# Patient Record
Sex: Female | Born: 1970 | Race: Asian | Hispanic: No | Marital: Married | Smoking: Never smoker
Health system: Western US, Academic
[De-identification: ages and names within clinical notes are randomized; demographics above are authoritative.]

## PROBLEM LIST (undated history)

## (undated) DIAGNOSIS — N200 Calculus of kidney: Secondary | ICD-10-CM

## (undated) DIAGNOSIS — Z87442 Personal history of urinary calculi: Secondary | ICD-10-CM

## (undated) DIAGNOSIS — N8500 Endometrial hyperplasia, unspecified: Secondary | ICD-10-CM

## (undated) HISTORY — PX: APPENDECTOMY: SHX54

## (undated) HISTORY — DX: Personal history of urinary calculi: Z87.442

## (undated) HISTORY — DX: Endometrial hyperplasia, unspecified: N85.00

## (undated) HISTORY — DX: Calculus of kidney: N20.0

## (undated) MED ORDER — SODIUM CHLORIDE 0.9 % IV SOLN
Freq: Once | INTRAVENOUS | Status: AC
Start: 2019-04-06 — End: 2019-04-06

## (undated) MED ORDER — DIPHENHYDRAMINE HCL 50 MG/ML IJ SOLN
25.00 mg | Freq: Once | INTRAMUSCULAR | Status: AC | PRN
Start: 2019-04-06 — End: ?

## (undated) MED ORDER — METHYLPREDNISOLONE SODIUM SUCC 125 MG IJ SOLR
125.00 mg | Freq: Once | INTRAMUSCULAR | Status: AC
Start: 2019-03-30 — End: 2019-03-28

## (undated) MED ORDER — ACETAMINOPHEN 325 MG PO TABS
650.00 mg | ORAL_TABLET | Freq: Once | ORAL | Status: AC
Start: 2019-04-06 — End: 2019-04-06

## (undated) MED ORDER — RITUXIMAB 500 MG/50ML IV SOLN
375.00 mg/m2 | Freq: Once | INTRAVENOUS | Status: AC
Start: 2019-03-30 — End: 2019-03-30

## (undated) MED ORDER — NALTREXONE HCL (PAIN) 4.5 MG PO CAPS
2.00 | ORAL_CAPSULE | Freq: Every day | ORAL | 0 refills | Status: AC
Start: 2022-12-30 — End: ?

## (undated) MED ORDER — WEGOVY 1.7 MG/0.75ML SC SOAJ
SUBCUTANEOUS | 0 refills | Status: AC
Start: 2020-05-21 — End: ?

## (undated) MED ORDER — BUPROPION XL (DAILY) 150 MG OR TB24
150.0000 mg | ORAL_TABLET | Freq: Every morning | ORAL | 3 refills | Status: AC
Start: 2022-05-20 — End: ?

## (undated) MED ORDER — METHYLPREDNISOLONE SODIUM SUCC 125 MG IJ SOLR
125.00 mg | Freq: Once | INTRAMUSCULAR | Status: AC
Start: 2019-04-06 — End: 2019-04-06

## (undated) MED ORDER — DIPHENHYDRAMINE HCL 50 MG/ML IJ SOLN
50.00 mg | Freq: Once | INTRAMUSCULAR | Status: AC
Start: 2019-03-30 — End: 2019-03-30

## (undated) MED ORDER — OXYCODONE HCL 5 MG OR TABS
10.0000 mg | ORAL_TABLET | ORAL | Status: AC | PRN
Start: 2022-01-08 — End: ?

## (undated) MED ORDER — WEGOVY 2.4 MG/0.75ML SC SOAJ
SUBCUTANEOUS | 2 refills | Status: AC
Start: 2021-05-02 — End: ?

## (undated) MED ORDER — TRAZODONE HCL 50 MG OR TABS
50.0000 mg | ORAL_TABLET | Freq: Every evening | ORAL | 1 refills | Status: AC
Start: 2022-10-03 — End: ?

## (undated) MED ORDER — HYDROCORTISONE SOD SUCCINATE 100 MG IJ SOLR
100.00 mg | Freq: Once | INTRAMUSCULAR | Status: AC | PRN
Start: 2019-03-30 — End: ?

## (undated) MED ORDER — ACETAMINOPHEN 325 MG OR TABS
650.00 mg | ORAL_TABLET | Freq: Once | ORAL | Status: AC
Start: 2019-03-30 — End: 2019-03-28

## (undated) MED ORDER — FENTANYL CITRATE (PF) 100 MCG/2ML IJ SOLN
50.0000 ug | INTRAMUSCULAR | Status: AC | PRN
Start: 2022-01-08 — End: 2022-01-09

## (undated) MED ORDER — DIPHENHYDRAMINE HCL 25 MG OR TABS OR CAPS CUSTOM
25.00 mg | ORAL_CAPSULE | Freq: Once | ORAL | Status: AC
Start: 2019-04-06 — End: 2019-04-06

## (undated) MED ORDER — ACETAMINOPHEN 325 MG PO TABS
650.00 mg | ORAL_TABLET | Freq: Once | ORAL | Status: AC
Start: 2019-03-30 — End: 2019-03-28

## (undated) MED ORDER — BUPROPION XL (DAILY) 150 MG OR TB24
150.0000 mg | ORAL_TABLET | Freq: Every morning | ORAL | 3 refills | Status: AC
Start: 2023-07-23 — End: ?

## (undated) MED ORDER — DIPHENHYDRAMINE HCL 25 MG OR TABS OR CAPS CUSTOM
25.00 mg | ORAL_CAPSULE | Freq: Once | ORAL | Status: AC
Start: 2019-03-30 — End: 2019-03-28

## (undated) MED ORDER — WEGOVY 0.5 MG/0.5ML SC SOAJ
SUBCUTANEOUS | 0 refills | Status: AC
Start: 2021-05-02 — End: ?

## (undated) MED ORDER — ACETAMINOPHEN 325 MG OR TABS
650.00 mg | ORAL_TABLET | Freq: Once | ORAL | Status: AC
Start: 2019-04-06 — End: 2019-04-06

## (undated) MED ORDER — RITUXIMAB 500 MG/50ML IV SOLN
375.00 mg/m2 | Freq: Once | INTRAVENOUS | Status: AC
Start: 2019-04-06 — End: 2019-04-06

## (undated) MED ORDER — GABAPENTIN 100 MG OR CAPS
ORAL_CAPSULE | ORAL | Status: AC
Start: 2023-01-13 — End: ?

## (undated) MED ORDER — WEGOVY 1 MG/0.5ML SC SOAJ
SUBCUTANEOUS | 0 refills | Status: AC
Start: 2021-05-02 — End: ?

## (undated) MED ORDER — ALPRAZOLAM 0.5 MG OR TABS
0.50 mg | ORAL_TABLET | Freq: Every evening | ORAL | 2 refills | Status: AC | PRN
Start: 2023-03-13 — End: ?

## (undated) MED ORDER — PLASMA-LYTE A IV SOLN
INTRAVENOUS | Status: AC
Start: 2022-01-08 — End: ?

## (undated) MED ORDER — DIPHENHYDRAMINE HCL 50 MG/ML IJ SOLN
50.00 mg | Freq: Once | INTRAMUSCULAR | Status: AC
Start: 2019-03-29 — End: 2019-03-29

## (undated) MED ORDER — FAMOTIDINE 20 MG/2ML IV SOLN
20.00 mg | Freq: Once | INTRAVENOUS | Status: AC | PRN
Start: 2019-03-30 — End: ?

## (undated) MED ORDER — RITUXIMAB 500 MG/50ML IV SOLN
375.00 mg/m2 | Freq: Once | INTRAVENOUS | Status: AC
Start: 2019-04-06 — End: 2019-04-05

## (undated) MED ORDER — SODIUM CHLORIDE 0.9 % IV SOLN
Freq: Once | INTRAVENOUS | Status: AC
Start: 2019-03-30 — End: 2019-03-28

## (undated) MED ORDER — VALACYCLOVIR HCL 500 MG OR TABS
500.00 mg | ORAL_TABLET | Freq: Every day | ORAL | 3 refills | Status: AC
Start: 2022-12-31 — End: ?

## (undated) MED ORDER — DIPHENHYDRAMINE HCL 50 MG/ML IJ SOLN
25.00 mg | Freq: Once | INTRAMUSCULAR | Status: AC | PRN
Start: 2019-03-30 — End: ?

## (undated) MED ORDER — OXYCODONE HCL 5 MG OR TABS
5.0000 mg | ORAL_TABLET | ORAL | Status: AC | PRN
Start: 2022-01-08 — End: ?

## (undated) MED ORDER — HYDROCODONE-ACETAMINOPHEN 5-325 MG OR TABS
1.0000 | ORAL_TABLET | ORAL | Status: AC | PRN
Start: 2022-01-08 — End: ?

## (undated) MED ORDER — GABAPENTIN 100 MG OR CAPS
ORAL_CAPSULE | ORAL | 1 refills | Status: AC
Start: 2023-02-06 — End: ?

## (undated) MED ORDER — MEPERIDINE HCL 25 MG/ML IJ SOLN
12.5000 mg | INTRAMUSCULAR | Status: AC | PRN
Start: 2022-01-08 — End: ?

## (undated) MED ORDER — SODIUM CHLORIDE 0.9 % IV SOLN
375.00 mg/m2 | Freq: Once | INTRAVENOUS | Status: AC
Start: 2019-03-30 — End: 2019-03-28

## (undated) MED ORDER — SODIUM CHLORIDE 0.9 % IV SOLN
12.5000 mg | Freq: Once | INTRAVENOUS | Status: AC | PRN
Start: 2022-01-08 — End: ?

## (undated) MED ORDER — HYDROCORTISONE SOD SUCCINATE 100 MG IJ SOLR
100.00 mg | Freq: Once | INTRAMUSCULAR | Status: AC | PRN
Start: 2019-04-06 — End: ?

## (undated) MED ORDER — FAMOTIDINE 20 MG/2ML IV SOLN
20.00 mg | Freq: Once | INTRAVENOUS | Status: AC | PRN
Start: 2019-04-06 — End: ?

## (undated) MED ORDER — WEGOVY 0.25 MG/0.5ML SC SOAJ
SUBCUTANEOUS | 0 refills | Status: AC
Start: 2021-05-02 — End: ?

## (undated) MED ORDER — NALOXONE HCL 0.4 MG/ML IJ SOLN
0.2000 mg | Freq: Once | INTRAMUSCULAR | Status: AC | PRN
Start: 2022-01-08 — End: ?

---

## 2000-08-04 HISTORY — PX: APPENDECTOMY: SHX54

## 2003-02-24 ENCOUNTER — Emergency Department (HOSPITAL_COMMUNITY): Admission: EM | Admit: 2003-02-24 | Discharge: 2003-02-24 | Payer: Self-pay | Admitting: *Deleted

## 2003-02-24 ENCOUNTER — Encounter: Payer: Self-pay | Admitting: *Deleted

## 2003-04-26 ENCOUNTER — Other Ambulatory Visit: Admission: RE | Admit: 2003-04-26 | Discharge: 2003-04-26 | Payer: Self-pay | Admitting: Gynecology

## 2003-09-23 ENCOUNTER — Emergency Department (HOSPITAL_COMMUNITY): Admission: EM | Admit: 2003-09-23 | Discharge: 2003-09-23 | Payer: Self-pay | Admitting: Emergency Medicine

## 2004-04-12 ENCOUNTER — Other Ambulatory Visit: Admission: RE | Admit: 2004-04-12 | Discharge: 2004-04-12 | Payer: Self-pay | Admitting: Gynecology

## 2004-11-03 ENCOUNTER — Inpatient Hospital Stay (HOSPITAL_COMMUNITY): Admission: AD | Admit: 2004-11-03 | Discharge: 2004-11-05 | Payer: Self-pay | Admitting: Gynecology

## 2004-11-06 ENCOUNTER — Encounter: Admission: RE | Admit: 2004-11-06 | Discharge: 2004-12-06 | Payer: Self-pay | Admitting: Gynecology

## 2004-12-19 ENCOUNTER — Other Ambulatory Visit: Admission: RE | Admit: 2004-12-19 | Discharge: 2004-12-19 | Payer: Self-pay | Admitting: Gynecology

## 2005-06-13 ENCOUNTER — Ambulatory Visit: Payer: Self-pay | Admitting: Pulmonary Disease

## 2005-07-14 ENCOUNTER — Ambulatory Visit: Payer: Self-pay | Admitting: Pulmonary Disease

## 2006-01-06 ENCOUNTER — Other Ambulatory Visit: Admission: RE | Admit: 2006-01-06 | Discharge: 2006-01-06 | Payer: Self-pay | Admitting: Gynecology

## 2007-03-26 ENCOUNTER — Other Ambulatory Visit: Admission: RE | Admit: 2007-03-26 | Discharge: 2007-03-26 | Payer: Self-pay | Admitting: Gynecology

## 2008-06-21 ENCOUNTER — Ambulatory Visit: Payer: Self-pay | Admitting: Gynecology

## 2008-08-22 ENCOUNTER — Ambulatory Visit: Payer: Self-pay | Admitting: Gynecology

## 2008-08-23 ENCOUNTER — Ambulatory Visit: Payer: Self-pay | Admitting: Gynecology

## 2009-02-16 ENCOUNTER — Ambulatory Visit: Payer: Self-pay | Admitting: Gynecology

## 2009-03-06 ENCOUNTER — Ambulatory Visit: Payer: Self-pay | Admitting: Gynecology

## 2009-03-14 ENCOUNTER — Other Ambulatory Visit: Admission: RE | Admit: 2009-03-14 | Discharge: 2009-03-14 | Payer: Self-pay | Admitting: Gynecology

## 2009-03-14 ENCOUNTER — Encounter: Payer: Self-pay | Admitting: Gynecology

## 2009-03-14 ENCOUNTER — Ambulatory Visit: Payer: Self-pay | Admitting: Gynecology

## 2009-09-19 ENCOUNTER — Ambulatory Visit: Payer: Self-pay | Admitting: Internal Medicine

## 2009-09-19 DIAGNOSIS — Z87442 Personal history of urinary calculi: Secondary | ICD-10-CM | POA: Insufficient documentation

## 2009-10-30 ENCOUNTER — Ambulatory Visit: Payer: Self-pay | Admitting: Gynecology

## 2009-11-02 ENCOUNTER — Ambulatory Visit: Payer: Self-pay | Admitting: Gynecology

## 2009-11-30 ENCOUNTER — Ambulatory Visit: Payer: Self-pay | Admitting: Gynecology

## 2010-02-08 ENCOUNTER — Ambulatory Visit: Payer: Self-pay | Admitting: Gynecology

## 2010-03-18 ENCOUNTER — Other Ambulatory Visit: Admission: RE | Admit: 2010-03-18 | Discharge: 2010-03-18 | Payer: Self-pay | Admitting: Gynecology

## 2010-03-18 ENCOUNTER — Ambulatory Visit: Payer: Self-pay | Admitting: Gynecology

## 2010-03-27 ENCOUNTER — Ambulatory Visit: Payer: Self-pay | Admitting: Gynecology

## 2010-03-28 ENCOUNTER — Ambulatory Visit: Payer: Self-pay | Admitting: Gynecology

## 2010-05-15 ENCOUNTER — Ambulatory Visit: Payer: Self-pay | Admitting: Gynecology

## 2010-09-01 LAB — CONVERTED CEMR LAB
LDL Cholesterol: 78 mg/dL (ref 0–99)
Total CHOL/HDL Ratio: 4
VLDL: 27.2 mg/dL (ref 0.0–40.0)

## 2010-09-03 NOTE — Letter (Signed)
Summary: Lipid Letter  [facility]  [address] [place], [place] [address]   Phone: [phone]  Fax: [phone]    09/19/2009  Pamela Crosby [address] [place], [place]  [address]  Dear Pamela Crosby:  We have carefully reviewed your last lipid profile from 09/19/2009 and the results are noted below with a summary of recommendations for lipid management.    Cholesterol:       137     Goal: <200   HDL "good" Cholesterol:   57.84     Goal: >40   LDL "bad" Cholesterol:   78     Goal: <130   Triglycerides:       136.0     Goal: <150    so-so results    TLC Diet (Therapeutic Lifestyle Change): Saturated Fats & Transfatty acids should be kept < 7% of total calories ***Reduce Saturated Fats Polyunstaurated Fat can be up to 10% of total calories Monounsaturated Fat Fat can be up to 20% of total calories Total Fat should be no greater than 25-35% of total calories Carbohydrates should be 50-60% of total calories Protein should be approximately 15% of total calories Fiber should be at least 20-30 grams a day ***Increased fiber may help lower LDL Total Cholesterol should be < 200mg /day Consider adding plant stanol/sterols to diet (example: Benacol spread) ***A higher intake of unsaturated fat may reduce Triglycerides and Increase HDL    Adjunctive Measures (may lower LIPIDS and reduce risk of Heart Attack) include: Aerobic Exercise (20-30 minutes 3-4 times a week) Limit Alcohol Consumption Weight Reduction Aspirin 75-81 mg a day by mouth (if not allergic or contraindicated) Dietary Fiber 20-30 grams a day by mouth     Current Medications:  None If you have any questions, please call. We appreciate being able to work with you.   Sincerely,    [facility] Etta Grandchild MD

## 2010-09-03 NOTE — Assessment & Plan Note (Signed)
Summary: new med cost--physical-pkg-stc   Vital Signs:  Patient profile:   40 year old female Menstrual status:  irregular LMP:     09/16/2009 Height:      60 inches Weight:      183 pounds BMI:     35.87 O2 Sat:      99 % on Room air Temp:     98.7 degrees F oral Pulse rate:   74 / minute Pulse rhythm:   regular Resp:     16 per minute BP sitting:   108 / 78  (left arm) Cuff size:   large  Vitals Entered By: Rock Nephew CMA (September 19, 2009 10:31 AM)  Nutrition Counseling: Patient's BMI is greater than 25 and therefore counseled on weight management options.  O2 Flow:  Room air  Primary Care Provider:  Etta Grandchild MD   History of Present Illness: New to me for a complete physical.  Preventive Screening-Counseling & Management  Alcohol-Tobacco     Alcohol drinks/day: 0     Smoking Status: never  Caffeine-Diet-Exercise     Does Patient Exercise: no  Hep-HIV-STD-Contraception     Hepatitis Risk: no risk noted     HIV Risk: no risk noted     STD Risk: no risk noted     SBE monthly: yes     SBE Education/Counseling: to perform regular SBE  Safety-Violence-Falls     Seat Belt Use: yes     Helmet Use: yes     Firearms in the Home: no firearms in the home     Smoke Detectors: yes     Violence in the Home: no risk noted     Sexual Abuse: no      Sexual History:  currently monogamous.        Drug Use:  no.        Blood Transfusions:  no.    Clinical Review Panels:  Prevention   Last Pap Smear:  normal (03/04/2009)  Immunizations   Last Tetanus Booster:  Tdap (08/05/2007)   Last Pneumovax:  Historical (08/05/2007)  Diabetes Management   Last Pneumovax:  Historical (08/05/2007)   Medications Prior to Update: 1)  None  Current Medications (verified): 1)  None  Allergies (verified): No Known Drug Allergies  Past History:  Past Medical History: Nephrolithiasis, hx of  Past Surgical History: Appendectomy  Family History: Family  History of Alcoholism/Addiction Family History of Arthritis  Social History: Occupation: Futures trader Married Never Smoked Alcohol use-no Drug use-no Regular exercise-no Smoking Status:  never Hepatitis Risk:  no risk noted HIV Risk:  no risk noted STD Risk:  no risk noted Seat Belt Use:  yes Sexual History:  currently monogamous Blood Transfusions:  no Drug Use:  no Does Patient Exercise:  no  Review of Systems       The patient complains of weight gain.  The patient denies anorexia, fever, weight loss, chest pain, syncope, dyspnea on exertion, peripheral edema, prolonged cough, headaches, hemoptysis, abdominal pain, hematuria, suspicious skin lesions, abnormal bleeding, enlarged lymph nodes, and breast masses.    Physical Exam  General:  alert, well-developed, well-nourished, well-hydrated, appropriate dress, normal appearance, healthy-appearing, cooperative to examination, good hygiene, and overweight-appearing.   Head:  normocephalic, atraumatic, no abnormalities observed, and no abnormalities palpated.   Eyes:  vision grossly intact, pupils equal, pupils round, and pupils reactive to light.   Ears:  External ear exam shows no significant lesions or deformities.  Otoscopic examination reveals clear canals, tympanic  membranes are intact bilaterally without bulging, retraction, inflammation or discharge. Hearing is grossly normal bilaterally. Mouth:  Oral mucosa and oropharynx without lesions or exudates.  Teeth in good repair. Neck:  supple, full ROM, no masses, no thyromegaly, no thyroid nodules or tenderness, no JVD, normal carotid upstroke, no carotid bruits, no cervical lymphadenopathy, and no neck tenderness.   Lungs:  Normal respiratory effort, chest expands symmetrically. Lungs are clear to auscultation, no crackles or wheezes. Heart:  Normal rate and regular rhythm. S1 and S2 normal without gallop, murmur, click, rub or other extra sounds. Abdomen:  Bowel sounds  positive,abdomen soft and non-tender without masses, organomegaly or hernias noted. Msk:  No deformity or scoliosis noted of thoracic or lumbar spine.   Pulses:  R and L carotid,radial,femoral,dorsalis pedis and posterior tibial pulses are full and equal bilaterally Extremities:  No clubbing, cyanosis, edema, or deformity noted with normal full range of motion of all joints.   Neurologic:  No cranial nerve deficits noted. Station and gait are normal. Plantar reflexes are down-going bilaterally. DTRs are symmetrical throughout. Sensory, motor and coordinative functions appear intact. Skin:  Intact without suspicious lesions or rashes Cervical Nodes:  No lymphadenopathy noted Axillary Nodes:  No palpable lymphadenopathy Inguinal Nodes:  No significant adenopathy Psych:  Cognition and judgment appear intact. Alert and cooperative with normal attention span and concentration. No apparent delusions, illusions, hallucinations Additional Exam:  ekg is normal   Impression & Recommendations:  Problem # 1:  ROUTINE GENERAL MEDICAL EXAM@HEALTH  CARE FACL (ICD-V70.0) Assessment New  Orders: Venipuncture ([address]) TLB-Lipid Panel (80061-LIPID) EKG w/ Interpretation ([address])  Pap smear: normal (03/04/2009) Td Booster: Tdap (08/05/2007)   Pneumovax: Historical (08/05/2007)  Discussed using sunscreen, use of alcohol, drug use, self breast exam, routine dental care, routine eye care, schedule for GYN exam, routine physical exam, seat belts, multiple vitamins, osteoporosis prevention, adequate calcium intake in diet, recommendations for immunizations, mammograms and Pap smears.  Discussed exercise and checking cholesterol.  Discussed gun safety, safe sex, and contraception.  PAP Screening:    Hx Cervical Dysplasia in last 5 yrs? No    3 normal PAP smears in last 5 yrs? Yes    Last PAP smear:  03/04/2009    Reviewed PAP smear recommendations:  patient defers to GYN provider  Mammogram Screening:     Reviewed Mammogram recommendations:  mammogram not due yet  Osteoporosis Risk Assessment:  Risk Factors for Fracture or Low Bone Density:   Race (White or Asian):     yes   Smoking status:       never  Immunization & Chemoprophylaxis:    Tetanus vaccine: Tdap  (08/05/2007)    Pneumovax: Historical  (08/05/2007)  Patient Instructions: 1)  Please schedule a follow-up appointment as needed. 2)  It is important that you exercise regularly at least 20 minutes 5 times a week. If you develop chest pain, have severe difficulty breathing, or feel very tired , stop exercising immediately and seek medical attention. 3)  You need to lose weight. Consider a lower calorie diet and regular exercise.  4)  Schedule your mammogram. 5)  You need to have a Pap Smear to prevent cervical cancer.  Preventive Care Screening  Pap Smear:    Date:  03/04/2009    Results:  normal   Last Tetanus Booster:    Date:  08/05/2007    Results:  Tdap     Immunization History:  Pneumovax Immunization History:    Pneumovax:  historical (08/05/2007)

## 2010-09-06 ENCOUNTER — Ambulatory Visit: Payer: BC Managed Care – PPO | Admitting: Gynecology

## 2010-09-06 DIAGNOSIS — N926 Irregular menstruation, unspecified: Secondary | ICD-10-CM

## 2010-10-16 ENCOUNTER — Ambulatory Visit (INDEPENDENT_AMBULATORY_CARE_PROVIDER_SITE_OTHER): Payer: BC Managed Care – PPO | Admitting: Gynecology

## 2010-10-16 DIAGNOSIS — L680 Hirsutism: Secondary | ICD-10-CM

## 2010-11-19 ENCOUNTER — Ambulatory Visit (INDEPENDENT_AMBULATORY_CARE_PROVIDER_SITE_OTHER): Payer: BC Managed Care – PPO | Admitting: Gynecology

## 2010-11-19 DIAGNOSIS — N898 Other specified noninflammatory disorders of vagina: Secondary | ICD-10-CM

## 2010-11-19 DIAGNOSIS — B373 Candidiasis of vulva and vagina: Secondary | ICD-10-CM

## 2010-12-19 ENCOUNTER — Encounter: Payer: Self-pay | Admitting: Internal Medicine

## 2010-12-23 ENCOUNTER — Encounter: Payer: BC Managed Care – PPO | Admitting: Internal Medicine

## 2011-07-07 ENCOUNTER — Encounter: Payer: Self-pay | Admitting: Gynecology

## 2011-07-07 ENCOUNTER — Ambulatory Visit (INDEPENDENT_AMBULATORY_CARE_PROVIDER_SITE_OTHER): Payer: BC Managed Care – PPO | Admitting: Gynecology

## 2011-07-07 VITALS — BP 112/66

## 2011-07-07 DIAGNOSIS — B3731 Acute candidiasis of vulva and vagina: Secondary | ICD-10-CM

## 2011-07-07 DIAGNOSIS — B373 Candidiasis of vulva and vagina: Secondary | ICD-10-CM

## 2011-07-07 DIAGNOSIS — L293 Anogenital pruritus, unspecified: Secondary | ICD-10-CM

## 2011-07-07 DIAGNOSIS — N898 Other specified noninflammatory disorders of vagina: Secondary | ICD-10-CM

## 2011-07-07 MED ORDER — FLUCONAZOLE 150 MG PO TABS: 150.0000 mg | ORAL_TABLET | Freq: Once | ORAL | Status: AC

## 2011-07-07 NOTE — Progress Notes (Signed)
Patient presents complaining of vaginal itching with discharge. She had been treated this past year for yeast says it feels similar to her prior episode. She also notes twice during her menses she had painful ulcers which she described on her vulva which went away when her period was over. No history of this before and only happened on 2 episodes.  Exam Pelvic: External BUS vagina with white discharge no external abnormalities. Cervix normal. Uterus normal size midline mobile nontender. Adnexa without masses or tenderness  Assessment and plan: 1. Vaginal discharge with itching. KOH wet prep is positive for yeast. We'll treat with Diflucan 150x1 dose I gave her 2 additional pills of available patient has recurrence.  2. Reported ulcers with menses x2 episodes. Exam today is normal.  I asked her to represent when and if she has it so that we can take a look. I did discuss the possibility of herpes and she agrees to follow up if she has recurrence.  3. Menses. Her menses are regular. 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  Follow up biopsy was negative after she did a series of progesterone withdrawals. She is not using anything for contraception and I did review with her the risk of pregnancy with advancing maternal age to include chromosomal abnormalities as well as increased risks from medical complications. Patient understands and accepts this.

## 2011-07-07 NOTE — Patient Instructions (Signed)
Take medication as prescribed. Return for office visit if you have a vulvar ulcer recurrence

## 2011-07-10 ENCOUNTER — Encounter: Payer: BC Managed Care – PPO | Admitting: Gynecology

## 2011-07-14 ENCOUNTER — Encounter: Payer: Self-pay | Admitting: Gynecology

## 2011-07-14 ENCOUNTER — Ambulatory Visit (INDEPENDENT_AMBULATORY_CARE_PROVIDER_SITE_OTHER): Payer: BC Managed Care – PPO | Admitting: Gynecology

## 2011-07-14 VITALS — BP 118/74 | Ht 60.5 in | Wt 184.0 lb

## 2011-07-14 DIAGNOSIS — Z01419 Encounter for gynecological examination (general) (routine) without abnormal findings: Secondary | ICD-10-CM

## 2011-07-14 DIAGNOSIS — Z13220 Encounter for screening for lipoid disorders: Secondary | ICD-10-CM

## 2011-07-14 DIAGNOSIS — Z131 Encounter for screening for diabetes mellitus: Secondary | ICD-10-CM

## 2011-07-14 DIAGNOSIS — R7309 Other abnormal glucose: Secondary | ICD-10-CM

## 2011-07-14 DIAGNOSIS — E782 Mixed hyperlipidemia: Secondary | ICD-10-CM

## 2011-07-14 DIAGNOSIS — R82998 Other abnormal findings in urine: Secondary | ICD-10-CM

## 2011-07-14 DIAGNOSIS — R635 Abnormal weight gain: Secondary | ICD-10-CM

## 2011-07-14 NOTE — Progress Notes (Signed)
Pamela Crosby 1970/09/20 [id number]        40 y.o.  for annual exam.  Overall doing well regular menses. Some issues with weight control.  Past medical history,surgical history, medications, allergies, family history and social history were all reviewed and documented in the EPIC chart. ROS:  Was performed and pertinent positives and negatives are included in the history.  Exam: chaperone present Filed Vitals:   07/14/11 1413  BP: 118/74   General appearance  Normal Skin grossly normal Head/Neck normal with no cervical or supraclavicular adenopathy thyroid normal Lungs  clear Cardiac RR, without RMG Abdominal  soft, nontender, without masses, organomegaly or hernia Breasts  examined lying and sitting without masses, retractions, discharge or axillary adenopathy. Pelvic  Ext/BUS/vagina  normal   Cervix  normal    Uterus  anteverted, normal size, shape and contour, midline and mobile nontender   Adnexa  Without masses or tenderness    Anus and perineum  normal   Rectovaginal  normal sphincter tone without palpated masses or tenderness.    Assessment/Plan:  40 y.o. female for annual exam.    1. Birth control. Patient is having regular menses not consistently using birth control. She says her husbands travels a lot. I reviewed the risk of failure and pregnancy and offered more consistent contraception. The patient declined as she would except pregnancy if it occurs. She is on a multivitamin with folic acid. 2. Endometrial hyperplasia. Patient does have a history of endometrial hyperplasia in the past when she was having amenorrheic episodes. She had a follow up biopsy which was normal. Her menses are regular now as long as she continues with regular menses we'll monitor. 3. Breast health. SBE monthly reviewed. Screening mammogram recommendations between 35 and 40 were reviewed. The patient prefers to wait to 40. 4. Pap smear. Last Pap smear 2011. She has no history of significant atypia.  I reviewed current guidelines with her and every 3 year screening and she is comfortable with this and no Pap smear was done today. 5. Weight control. We'll check TSH for completeness. Patient is struggling with weight control she understands and needs for diet and exercise. 6. Health maintenance. Will check baseline CBC lipid profile glucose urinalysis along with her TSH. Assuming she continues well has regular menses she'll see me in a year she was discussed more consistent contraception she'll follow up sooner.    Dara Lords MD, 3:15 PM 07/14/2011

## 2011-07-14 NOTE — Patient Instructions (Signed)
Follow up her annual exam in one year or sooner if patient wants to discuss contraception.

## 2011-07-15 MED ORDER — SULFAMETHOXAZOLE-TMP DS 800-160 MG PO TABS: 1.0000 | ORAL_TABLET | Freq: Two times a day (BID) | ORAL | Status: AC

## 2011-07-15 NOTE — Progress Notes (Signed)
Addended by: Dara Lords on: 07/15/2011 02:16 PM   Modules accepted: Orders

## 2011-07-15 NOTE — Progress Notes (Signed)
Addended by: Valeda Malm L on: 07/15/2011 03:40 PM   Modules accepted: Orders

## 2011-07-16 ENCOUNTER — Ambulatory Visit (INDEPENDENT_AMBULATORY_CARE_PROVIDER_SITE_OTHER): Payer: BC Managed Care – PPO | Admitting: Gynecology

## 2011-07-16 DIAGNOSIS — R7309 Other abnormal glucose: Secondary | ICD-10-CM

## 2011-07-16 DIAGNOSIS — E782 Mixed hyperlipidemia: Secondary | ICD-10-CM

## 2011-07-17 ENCOUNTER — Other Ambulatory Visit: Payer: BC Managed Care – PPO | Admitting: Gynecology

## 2011-07-17 LAB — GLUCOSE, RANDOM: Glucose, Bld: 95 mg/dL (ref 70–99)

## 2011-07-31 ENCOUNTER — Telehealth: Payer: Self-pay | Admitting: *Deleted

## 2011-07-31 NOTE — Telephone Encounter (Signed)
Pt called stating she has vaginal itching and little bumps in vaginal area. Pt wanted rx, but pt is in [place] now. Pt informed that she would need to go to a small clinic to see md for bumps. Pt was okay with this.

## 2011-08-12 ENCOUNTER — Telehealth: Payer: Self-pay | Admitting: *Deleted

## 2011-08-12 ENCOUNTER — Encounter: Payer: Self-pay | Admitting: Gynecology

## 2011-08-12 ENCOUNTER — Ambulatory Visit (INDEPENDENT_AMBULATORY_CARE_PROVIDER_SITE_OTHER): Payer: 59 | Admitting: Gynecology

## 2011-08-12 DIAGNOSIS — A6000 Herpesviral infection of urogenital system, unspecified: Secondary | ICD-10-CM

## 2011-08-12 DIAGNOSIS — N912 Amenorrhea, unspecified: Secondary | ICD-10-CM

## 2011-08-12 NOTE — Patient Instructions (Signed)
Report if you do not start your period in one week as well as if you have any recurrent areas that are suspicious for herpes.

## 2011-08-12 NOTE — Progress Notes (Signed)
Patient presents having been seen in [place] at an urgent care complaining of vaginal itching discharge. They treated her for yeast with Diflucan 150x1 dose also with Bactrim for UTI and did a herpes culture to an area on her perineum that was described in their office note as a pustular area. The culture results subsequently returned positive for HSV. She was given a prescription for acyclovir to start but she never did this and the irritation cleared within one day. On questioning now she does relate that her husband apparently had several contacts before they became married and that shortly after married she had what sounds to be a primary HSV outbreak where she had full far irritation and sores as well as difficulty urinating for one to 2 weeks.  Patient also notes that she is late for her period with last menses November 17. She does have a history of leg ovulatory cycles in the past.  Exam with Sherrilyn Rist chaperone present External BUS vagina normal Cervix normal Uterus normal size midline mobile nontender Adnexa without masses or tenderness  Assessment and plan:  1. 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 I discussed the issues of HSV-2 with her and I think historically it sounds as if her husband was the vector.  As she has no symptoms and no abnormalities on exam I do not think there is recently taking acyclovir now. The options for chronic suppressive therapy were reviewed in the issues as far as recurrence suppression as well as decreased transmission to partners was reviewed and she is not interested in doing this. I asked her to alert me if she has any further recurrences historically and she agrees to do so. 2. Wait for menses. Patient will monitor this week and if without menses she will call me. She has not had intercourse prior to her LMP and states that pregnancy is not a possibility. She had check  several you UPTs at home which were negative. She does note she feels like her period about rate to start. We'll monitor this week if she does not start she will call me and we'll consider progesterone withdrawal. Again she absolutely denies any possibility of pregnancy I did not do a pregnancy test today.

## 2011-08-12 NOTE — Telephone Encounter (Signed)
Pt informed with the below note. 

## 2011-08-12 NOTE — Telephone Encounter (Signed)
I need results of the herpes culture that was done and I'd recommend office visit so I can examine her and then we can discuss this.

## 2011-08-12 NOTE — Telephone Encounter (Signed)
Pt was seen at urgent care in [place] on 08/01/11, notes have been faxed to [place] and reviewed by you. Pt is calling because she is very concerned because the doctor said something about possible herpes? Please advise

## 2011-08-13 ENCOUNTER — Encounter: Payer: Self-pay | Admitting: Gynecology

## 2011-10-02 ENCOUNTER — Telehealth: Payer: Self-pay | Admitting: *Deleted

## 2011-10-02 MED ORDER — MEDROXYPROGESTERONE ACETATE 10 MG PO TABS: 10.0000 mg | ORAL_TABLET | Freq: Every day | ORAL | Status: DC

## 2011-10-02 NOTE — Telephone Encounter (Signed)
Patient called to give you an update on her menses.  She had a normal period starting on 08/11/11.  Then approx 09/09/11 she had very light spotting and has had that through out the month but no true period start.  She did check a pregnancy test that was neg.  Her last time of intercourse was in January.  Patient wants to know if we should call in rx to help her start period? Please advise.

## 2011-10-02 NOTE — Telephone Encounter (Signed)
Provera 10 mg x10 days okay.

## 2011-10-03 NOTE — Telephone Encounter (Signed)
Patient informed. 

## 2011-10-27 ENCOUNTER — Telehealth: Payer: Self-pay | Admitting: *Deleted

## 2011-10-27 NOTE — Telephone Encounter (Signed)
Patient had lm c/o pelvic pain at the end of her period that did not go away after taking pain relievers.  Lm for patient to call back to schedule appt so we could eval.

## 2011-10-28 ENCOUNTER — Ambulatory Visit (INDEPENDENT_AMBULATORY_CARE_PROVIDER_SITE_OTHER): Payer: 59 | Admitting: Gynecology

## 2011-10-28 ENCOUNTER — Encounter: Payer: Self-pay | Admitting: Gynecology

## 2011-10-28 DIAGNOSIS — N949 Unspecified condition associated with female genital organs and menstrual cycle: Secondary | ICD-10-CM

## 2011-10-28 DIAGNOSIS — R102 Pelvic and perineal pain: Secondary | ICD-10-CM

## 2011-10-28 MED ORDER — VALACYCLOVIR HCL 500 MG PO TABS: 500.0000 mg | ORAL_TABLET | Freq: Two times a day (BID) | ORAL | Status: AC

## 2011-10-28 NOTE — Progress Notes (Signed)
Patient presents with 2 issues: 1. Had menses in January, skipped February and took her Provera withdrawal in March started her menses and 5 or 6 days into it had a lot of left lower quadrant discomfort. This is totally resolved but she was worried about what cause the pain. No nausea vomiting diarrhea constipation. No dysuria or frequency low back pain. No fevers chills or other constitutional symptoms 2. Patient is getting recurrent HSV monthly the week before her menses. I had given her Valtrex before but she has not taken it.  Exam with Sherrilyn Rist chaperone present Abdomen soft nontender without masses guarding rebound organomegaly. Pelvic external BUS vagina normal. Cervix normal. Uterus normal size midline mobile nontender. Adnexa without masses or tenderness.  Assessment and plan: 1. Left lower quadrant/pelvic pain. Reviewed possibilities to include dysmenorrhea after skipped menses with progesterone withdrawal. Possible ovarian cyst/non-gynecologic such as GU/GI. Her exam today is normal. Options for ultrasound now versus monitoring reviewed. As her pain is totally gone we both feel comfortable with observation at present. I did recommend urinalysis today just to rule out urinary abnormality as she does have a history of renal lithiasis. If she has any recurrence of her patient is to call me and we'll set up an ultrasound. 2. Recurrent HSV. I again reviewed options with her. She had a lot of questions concerning HSV, why is she having recurrences now and not before. I reviewed the immune/premenstrual hormonal input with her. I recommended she start on Valtrex 500 daily for suppression for 6 months and at that point we can stop it and see how she does, if she continues to have outbreaks we can reinitiate suppression and she wants to go and try this I prescribed Valtrex 500 daily #30 refill x6.

## 2011-10-28 NOTE — Patient Instructions (Signed)
Report any recurrence of your pain. Start Valtrex 500 mg daily. If you continue to have HSV outbreaks than call.

## 2011-10-29 ENCOUNTER — Telehealth: Payer: Self-pay | Admitting: *Deleted

## 2011-10-29 LAB — URINALYSIS W MICROSCOPIC + REFLEX CULTURE
Casts: NONE SEEN
Crystals: NONE SEEN
Ketones, ur: NEGATIVE mg/dL
Nitrite: NEGATIVE
Specific Gravity, Urine: 1.011 (ref 1.005–1.030)
Urobilinogen, UA: 0.2 mg/dL (ref 0.0–1.0)
pH: 5.5 (ref 5.0–8.0)

## 2011-10-29 NOTE — Telephone Encounter (Signed)
Pt called requesting recent urine culture results, pt informed results are pending at this time, pt will call back.

## 2011-10-30 ENCOUNTER — Telehealth: Payer: Self-pay | Admitting: *Deleted

## 2011-10-30 NOTE — Telephone Encounter (Signed)
Pt informed of urine culture results.

## 2011-11-12 ENCOUNTER — Encounter: Payer: Self-pay | Admitting: Gynecology

## 2011-11-12 ENCOUNTER — Other Ambulatory Visit: Payer: Self-pay | Admitting: Family Medicine

## 2011-11-12 ENCOUNTER — Ambulatory Visit (INDEPENDENT_AMBULATORY_CARE_PROVIDER_SITE_OTHER): Payer: 59 | Admitting: Gynecology

## 2011-11-12 ENCOUNTER — Ambulatory Visit
Admission: RE | Admit: 2011-11-12 | Discharge: 2011-11-12 | Disposition: A | Discharge status: Home / Self Care | Payer: 59 | Source: Ambulatory Visit | Attending: Family Medicine | Admitting: Family Medicine

## 2011-11-12 DIAGNOSIS — N949 Unspecified condition associated with female genital organs and menstrual cycle: Secondary | ICD-10-CM

## 2011-11-12 DIAGNOSIS — M545 Low back pain: Secondary | ICD-10-CM

## 2011-11-12 DIAGNOSIS — R102 Pelvic and perineal pain: Secondary | ICD-10-CM

## 2011-11-12 NOTE — Progress Notes (Signed)
Patient presents having had episode of left lower quadrant pain for the last 2 days which seems to have now resolved. She had a similar episode last month. No nausea vomiting diarrhea constipation. No urinary symptoms.  Exam with Amy chaperone present Abdomen soft nontender without masses guarding rebound organomegaly. Pelvic external BUS vagina normal. Cervix normal. Uterus normal size midline mobile nontender. Adnexa without masses or tenderness.  Assessment and plan: Pelvic pain now resolved. We'll start with ultrasound rule out nonpalpable abnormalities. I discussed with the patient that I feel is probably ovulatory in nature. Assuming the ultrasound is negative I recommended Motrin when she starts to feel the discomfort. Alternatives such as low-dose oral contraceptive suppression also reviewed. Of note the patient was taking her Valtrex twice daily for suppression and I informed her that should be once daily for HSV suppression.

## 2011-11-12 NOTE — Patient Instructions (Signed)
Follow up for ultrasound as scheduled 

## 2011-11-13 ENCOUNTER — Ambulatory Visit (INDEPENDENT_AMBULATORY_CARE_PROVIDER_SITE_OTHER): Payer: 59

## 2011-11-13 ENCOUNTER — Encounter: Payer: Self-pay | Admitting: Gynecology

## 2011-11-13 ENCOUNTER — Ambulatory Visit (INDEPENDENT_AMBULATORY_CARE_PROVIDER_SITE_OTHER): Payer: 59 | Admitting: Gynecology

## 2011-11-13 VITALS — BP 128/80

## 2011-11-13 DIAGNOSIS — R102 Pelvic and perineal pain: Secondary | ICD-10-CM

## 2011-11-13 DIAGNOSIS — N949 Unspecified condition associated with female genital organs and menstrual cycle: Secondary | ICD-10-CM

## 2011-11-13 DIAGNOSIS — D259 Leiomyoma of uterus, unspecified: Secondary | ICD-10-CM

## 2011-11-13 DIAGNOSIS — N8300 Follicular cyst of ovary, unspecified side: Secondary | ICD-10-CM

## 2011-11-13 DIAGNOSIS — D251 Intramural leiomyoma of uterus: Secondary | ICD-10-CM

## 2011-11-13 NOTE — Patient Instructions (Signed)
Follow up with me if your pain persists or worsens.

## 2011-11-13 NOTE — Progress Notes (Signed)
Patient presents for ultrasound do to her pelvic pain. Again she is not having the pain now but seems to be a transient issue.  Ultrasound shows a small myoma intramural 20 mm. Right left ovaries were visualized and normal. Cul-de-sac without fluid. Left ovary was tender when pushed with the probe.  Assessment and plan: Intermittent pelvic pain I suspect ovulatory. Patient actually has follow up with urology this week due to your history of nephrolithiasis. Options for management include oral contraceptive ovulatory suppression reviewed and the risks include stroke heart attack DVT. She does not smoke and has not been followed for any medical issues.  She wants to just monitor her pain at present. I also reviewed possible laparoscopy rule out endometriosis. Patient will follow with me if her pain persists or worsens or changes she wants to pursue other treatment.

## 2011-11-26 ENCOUNTER — Telehealth: Payer: Self-pay | Admitting: *Deleted

## 2011-11-26 MED ORDER — MEDROXYPROGESTERONE ACETATE 10 MG PO TABS: 10.0000 mg | ORAL_TABLET | Freq: Two times a day (BID) | ORAL | Status: DC

## 2011-11-26 NOTE — Telephone Encounter (Signed)
Provera 10 mg twice a day x5 days okay.

## 2011-11-26 NOTE — Telephone Encounter (Signed)
Pt is calling c/o no period for this month yet. LMP:10/20/11, she took UPT yesterday and test was negative. Pt would like provera to start period. Please advise

## 2011-11-26 NOTE — Telephone Encounter (Signed)
Left the below note on pt voicemail, rx sent. 

## 2012-01-30 ENCOUNTER — Ambulatory Visit (INDEPENDENT_AMBULATORY_CARE_PROVIDER_SITE_OTHER): Payer: 59 | Admitting: Gynecology

## 2012-01-30 ENCOUNTER — Encounter: Payer: Self-pay | Admitting: Gynecology

## 2012-01-30 DIAGNOSIS — A499 Bacterial infection, unspecified: Secondary | ICD-10-CM

## 2012-01-30 DIAGNOSIS — L293 Anogenital pruritus, unspecified: Secondary | ICD-10-CM

## 2012-01-30 DIAGNOSIS — N898 Other specified noninflammatory disorders of vagina: Secondary | ICD-10-CM

## 2012-01-30 DIAGNOSIS — N926 Irregular menstruation, unspecified: Secondary | ICD-10-CM

## 2012-01-30 DIAGNOSIS — N760 Acute vaginitis: Secondary | ICD-10-CM

## 2012-01-30 DIAGNOSIS — B9689 Other specified bacterial agents as the cause of diseases classified elsewhere: Secondary | ICD-10-CM

## 2012-01-30 LAB — PREGNANCY, URINE: Preg Test, Ur: NEGATIVE

## 2012-01-30 LAB — WET PREP FOR TRICH, YEAST, CLUE: Yeast Wet Prep HPF POC: NONE SEEN

## 2012-01-30 MED ORDER — METRONIDAZOLE 500 MG PO TABS: 500.0000 mg | ORAL_TABLET | Freq: Two times a day (BID) | ORAL | Status: AC

## 2012-01-30 NOTE — Patient Instructions (Signed)
Take Flagyl medication twice daily for 7 days. Follow up if vaginal discharge or irritation continues. Follow up with her primary physician in reference to treatment for H. Pylori. Take Provera as prescribed. Continue every other month withdrawal bleed if without spontaneous menses.

## 2012-01-30 NOTE — Progress Notes (Signed)
Patient presents having recently been treated for urinary tract infection. She also apparently has been diagnosed with H. Pylori but has not started any of the treatment regimen as prescribed by her physician. She actually is awaiting a phone call from her other physician. Was also told she had thrush. Most recently complaining of vaginal discharge with itching of the last several days. Patient also notes her last menstrual period was in April. She has a long history of leg ovulatory cycles with menstrual skips and has a running prescription for Provera for withdrawal bleeds if she goes more than 2 months without menses. Patient denies pregnancy possibility.  Exams Kari assistant Abdomen: Soft nontender without masses guarding rebound organomegaly. Pelvic: External BUS vagina with white discharge. Cervix normal. Uterus grossly normal midline mobile nontender. Adnexa without masses or tenderness.  Assessment and plan: 1. Irregular menses. We'll check a PT. Patient will call in assuming negative take her Provera for withdrawal bleed. 2. Vaginal discharge/itching. Wet prep consistent with the BV. We'll treat with Flagyl 500 mg twice a day x7 days, all wounds reviewed. Follow up if symptoms persist or recur.

## 2012-04-21 ENCOUNTER — Ambulatory Visit (INDEPENDENT_AMBULATORY_CARE_PROVIDER_SITE_OTHER): Payer: 59

## 2012-04-21 ENCOUNTER — Encounter: Payer: Self-pay | Admitting: Gynecology

## 2012-04-21 ENCOUNTER — Ambulatory Visit (INDEPENDENT_AMBULATORY_CARE_PROVIDER_SITE_OTHER): Payer: 59 | Admitting: Gynecology

## 2012-04-21 VITALS — BP 120/78

## 2012-04-21 DIAGNOSIS — D259 Leiomyoma of uterus, unspecified: Secondary | ICD-10-CM

## 2012-04-21 DIAGNOSIS — R102 Pelvic and perineal pain unspecified side: Secondary | ICD-10-CM

## 2012-04-21 DIAGNOSIS — N8300 Follicular cyst of ovary, unspecified side: Secondary | ICD-10-CM

## 2012-04-21 DIAGNOSIS — B009 Herpesviral infection, unspecified: Secondary | ICD-10-CM

## 2012-04-21 DIAGNOSIS — N949 Unspecified condition associated with female genital organs and menstrual cycle: Secondary | ICD-10-CM

## 2012-04-21 DIAGNOSIS — D219 Benign neoplasm of connective and other soft tissue, unspecified: Secondary | ICD-10-CM

## 2012-04-21 DIAGNOSIS — N940 Mittelschmerz: Secondary | ICD-10-CM

## 2012-04-21 DIAGNOSIS — D251 Intramural leiomyoma of uterus: Secondary | ICD-10-CM

## 2012-04-21 LAB — URINALYSIS W MICROSCOPIC + REFLEX CULTURE
Glucose, UA: NEGATIVE mg/dL
Ketones, ur: NEGATIVE mg/dL
Leukocytes, UA: NEGATIVE
Nitrite: NEGATIVE
Specific Gravity, Urine: <1.005 — ABNORMAL LOW (ref 1.005–1.030)
pH: 5.5 (ref 5.0–8.0)

## 2012-04-21 NOTE — Patient Instructions (Addendum)
Fibroids You have been diagnosed as having a fibroid. Fibroids are smooth muscle lumps (tumors) which can occur any place in a woman's body. They are usually in the womb (uterus). The most common problem (symptom) of fibroids is bleeding. Over time this may cause low red blood cells (anemia). Other symptoms include feelings of pressure and pain in the pelvis. The diagnosis (learning what is wrong) of fibroids is made by physical exam. Sometimes tests such as an ultrasound are used. This is helpful when fibroids are felt around the ovaries and to look for tumors. TREATMENT   Most fibroids do not need surgical or medical treatment. Sometimes a tissue sample (biopsy) of the lining of the uterus is done to rule out cancer. If there is no cancer and only a small amount of bleeding, the problem can be watched.   Hormonal treatment can improve the problem.   When surgery is needed, it can consist of removing the fibroid. Vaginal birth may not be possible after the removal of fibroids. This depends on where they are and the extent of surgery. When pregnancy occurs with fibroids it is usually normal.   Your caregiver can help decide which treatments are best for you.  HOME CARE INSTRUCTIONS   Do not use aspirin as this may increase bleeding problems.   If your periods (menses) are heavy, record the number of pads or tampons used per month. Bring this information to your caregiver. This can help them determine the best treatment for you.  SEEK IMMEDIATE MEDICAL CARE IF:  You have pelvic pain or cramps not controlled with medications, or experience a sudden increase in pain.   You have an increase of pelvic bleeding between and during menses.   You feel lightheaded or have fainting spells.   You develop worsening belly (abdominal) pain.  Document Released: 07/18/2000 Document Revised: 07/10/2011 Document Reviewed: 03/09/2008 [facility] Patient Information 2012 [place], [place].

## 2012-04-21 NOTE — Progress Notes (Signed)
Patient is a 41 year old who presented to the office today complaining of left lower quadrant pain which started on September 12 at time of her menses. After menses ended she continues to have this intermittent left lower quadrant discomfort. She denies any dyspareunia. Her cycles are reported to be regular. She denies any intermenstrual bleeding. Review of her record indicated that in April 2013 because of left lower quadrant pain she was having an ultrasound and demonstrated only a small intramural myoma measuring 20 x 19 mm right ovary with dominant follicle and left ovary was found to be located posterior to the wall of the uterus but otherwise normal echo pattern. Patient is a monogamous relationship and is not using any form of contraception.  Exam: Abdomen: Soft nontender no rebound or guarding Pelvic: Bartholin urethra Skene was within normal limits Vagina: No lesion discharge Cervix: No lesions or discharge Uterus: Anteverted no palpable masses or tenderness Adnexa: No palpable masses or tenderness although somewhat limited due to patient's l abdominal girth Rectal: Not examined  Urinalysis: Negative  Ultrasound: Uterus measured 8.2 x 5.7 x 4.5 cm endometrial stripe 4.8 mm. Right intramural myoma measuring 18 x 16 mm. Left fundal fibroid measuring 27 x 32 mm. Right and left ovary normal. Left ovary located posterior to the uterus.  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 She takes Valtrex daily for suppression.  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 I reassured her ovaries are normal in comparison to previous study done in April. She has 2 small fibroids. She was concerned also about a possible outbreak from herpes began and I reassured her that examination did not demonstrate any outbreaks grossly. She will followup for her annual exam at the appropriate  time with my partner otherwise when necessary office visit.

## 2012-05-14 ENCOUNTER — Telehealth: Payer: Self-pay | Admitting: *Deleted

## 2012-05-14 MED ORDER — IBUPROFEN 800 MG PO TABS: 800.0000 mg | ORAL_TABLET | Freq: Three times a day (TID) | ORAL | Status: AC | PRN

## 2012-05-14 NOTE — Telephone Encounter (Signed)
It sounds from Dr. Konor Noren No note that it was more related to ovulation. She has 2 small fibroids I doubt that they're the source of her pain. I would recommend trying the prescription strength Motrin first which is like taking 4 over-the-counter Motrin as. See if this doesn't help. If it still continues unacceptable then call.

## 2012-05-14 NOTE — Telephone Encounter (Signed)
Pt calling c/o bad menstrual cramps x 2 months, LMP:05/13/12 she is taking OTC ibuprofen and no relief. Pt saw JF on 04/21/12 for abdominal pain, u/s done. pt asked if her fibroids could be causing this discomfort? Pt asked if she could have something a little stronger. Please advise

## 2012-05-14 NOTE — Telephone Encounter (Signed)
Pt informed with the below note. 

## 2012-08-21 IMAGING — CR DG LUMBAR SPINE COMPLETE 4+V
5 series · 5 of 5 positions shown · non-contrast
Comparison: None.

CLINICAL DATA: Low back pain.  Left leg pain.

LUMBAR SPINE - COMPLETE 4+ VIEW

[view not recorded (1 of 5)]
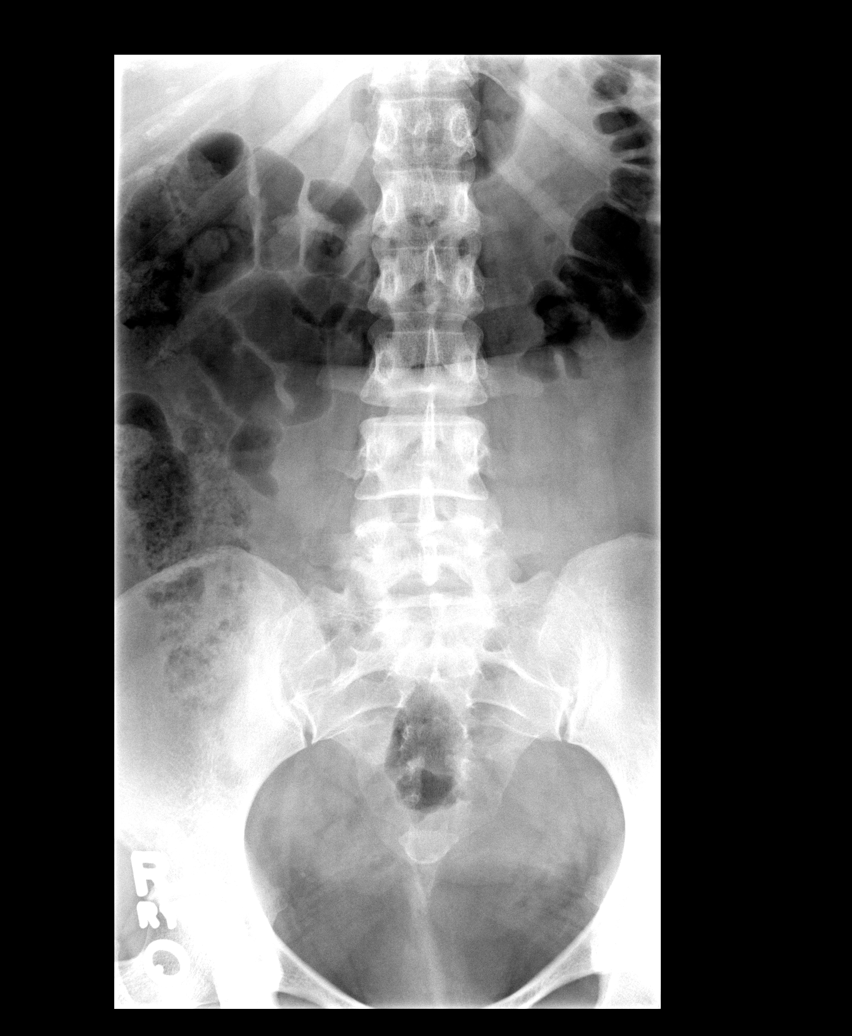

[view not recorded (2 of 5)]
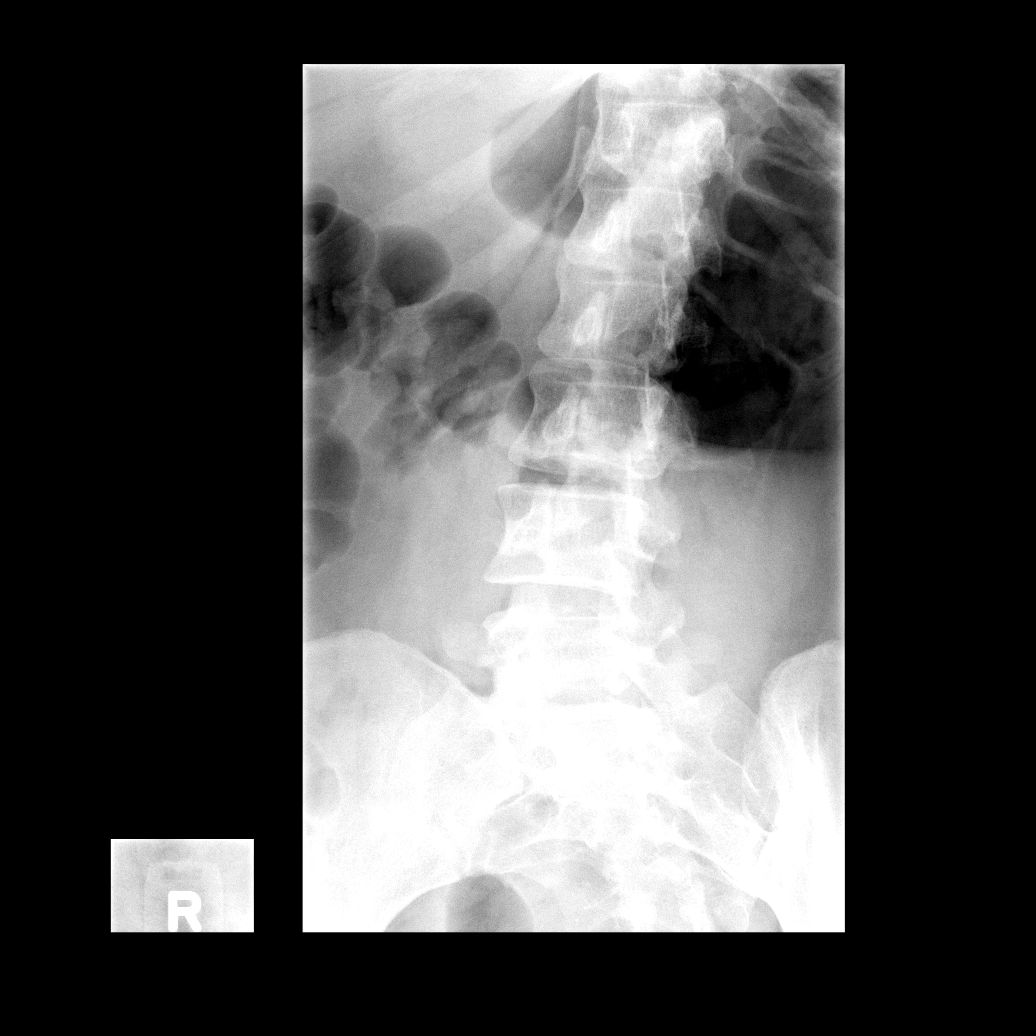

[view not recorded (3 of 5)]
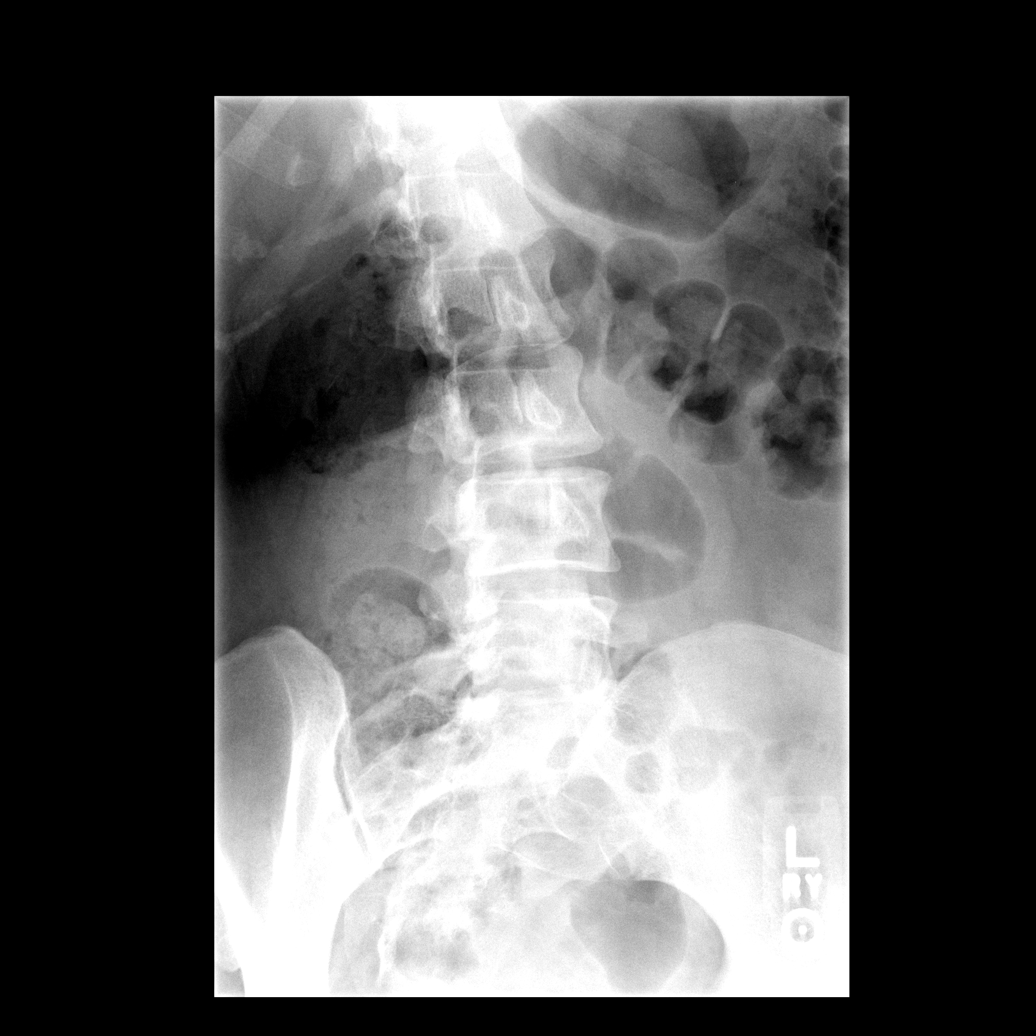

[view not recorded (4 of 5)]
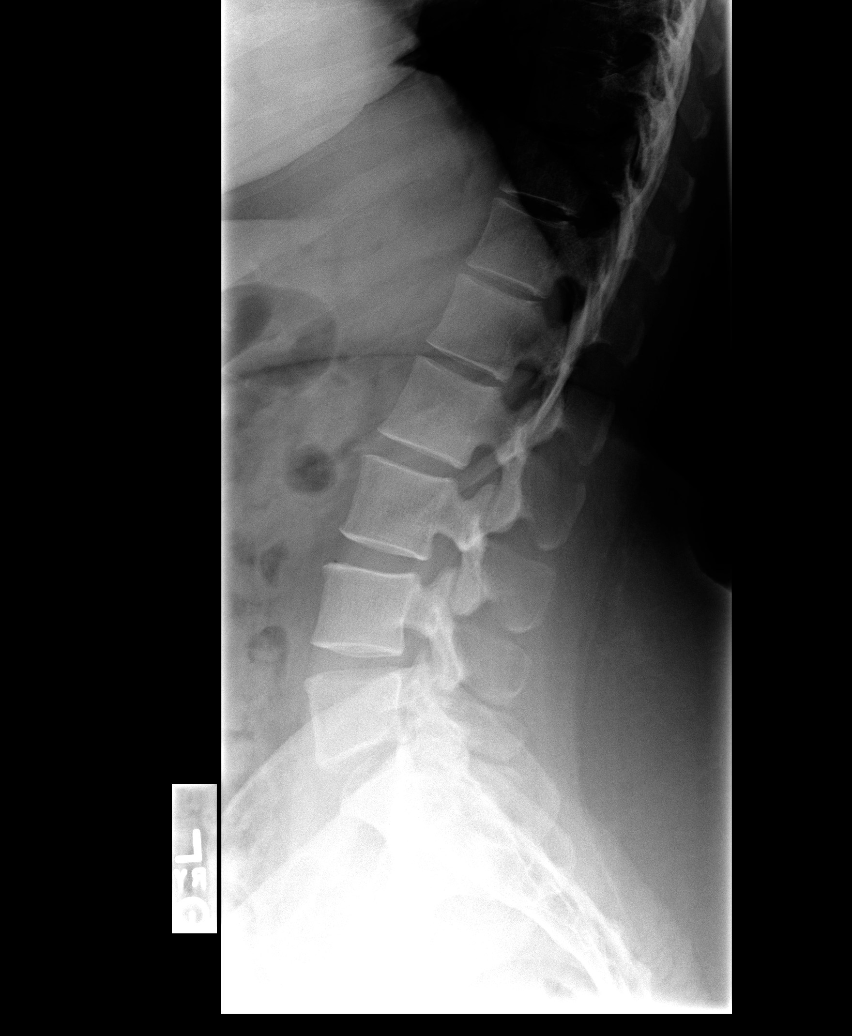

[view not recorded (5 of 5)]
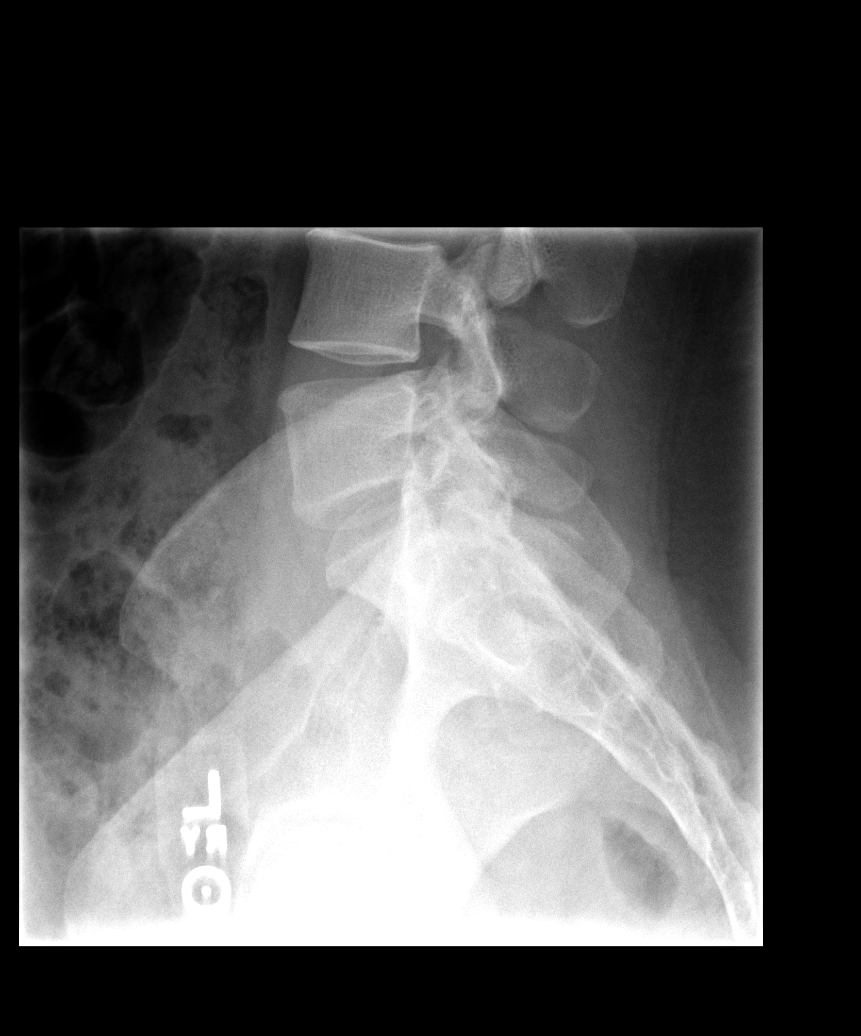

[5 of 5 positions shown; findings below may reference images not displayed]

FINDINGS: Five non-rib bearing lumbar type vertebral bodies are
present.  The vertebral body heights and alignment maintained.  The
visualized soft tissues and bony thorax are unremarkable.
IMPRESSION: Negative lumbar spine.

## 2012-12-29 ENCOUNTER — Encounter: Payer: Self-pay | Admitting: Gynecology

## 2012-12-29 ENCOUNTER — Ambulatory Visit (INDEPENDENT_AMBULATORY_CARE_PROVIDER_SITE_OTHER): Payer: 59 | Admitting: Gynecology

## 2012-12-29 DIAGNOSIS — N926 Irregular menstruation, unspecified: Secondary | ICD-10-CM

## 2012-12-29 DIAGNOSIS — L293 Anogenital pruritus, unspecified: Secondary | ICD-10-CM

## 2012-12-29 DIAGNOSIS — N898 Other specified noninflammatory disorders of vagina: Secondary | ICD-10-CM

## 2012-12-29 LAB — WET PREP FOR TRICH, YEAST, CLUE
Clue Cells Wet Prep HPF POC: NONE SEEN
Trich, Wet Prep: NONE SEEN

## 2012-12-29 MED ORDER — MEDROXYPROGESTERONE ACETATE 10 MG PO TABS: 10.0000 mg | ORAL_TABLET | Freq: Two times a day (BID) | ORAL | Status: DC

## 2012-12-29 MED ORDER — FLUCONAZOLE 150 MG PO TABS: 150.0000 mg | ORAL_TABLET | Freq: Once | ORAL | Status: DC

## 2012-12-29 MED ORDER — VALACYCLOVIR HCL 500 MG PO TABS: 500.0000 mg | ORAL_TABLET | Freq: Every day | ORAL | Status: DC

## 2012-12-29 NOTE — Addendum Note (Signed)
Addended by: Dayna Barker on: 12/29/2012 04:19 PM   Modules accepted: Orders

## 2012-12-29 NOTE — Progress Notes (Signed)
Patient presents with 2 issues: 1. Vaginal irritation and itching. Started several days ago. Has been on amoxicillin antibiotic for dental work. No odor or other symptoms. 2. Skipped May menses. Has had regular monthly menses before. Husbands in [place] and pregnancy is not a possibility. 3. Requests Valtrex prescription to take with her to [place]. Has occasional HSV outbreaks. Last outbreak 4 months ago.  Exam with Selena Batten assistant External BUS vagina with generalized erythematous vulvitis. Vagina with whitish discharge. Cervix normal. Uterus grossly normal size midline mobile nontender. Adnexa without masses or tenderness.  Assessment and plan: 1. Vaginal irritation and itching. Wet prep is negative. History strongly suggestive of yeast. Will treat with Diflucan 150 mg x1. I gave her 2 additional pills to take with her while traveling in the event of recurrence. 2. Skipped menses. UPT negative. Recommended progesterone withdrawal now. Patient would rather wait until she arrives in [place]. She assures me she will obtain from intercourse until she takes the Provera and has her menses. 3. Valtrex. I refilled her prescriptions 500 mg #30 with several refills. Options for daily suppressive treatment versus twice daily dose x3-5 days with outbreak discussed. The patient would prefer outbreak treatment. Patient is due for her annual and knows to do this and agrees to do so when she returns from [place].

## 2012-12-29 NOTE — Patient Instructions (Signed)
Take Diflucan pill once for yeast infection. Take the other 2 with each of [place] to use as needed for symptoms. Take Provera 10 mg twice daily for 5 days if her period does not start once you reach [place] after assuring that you are not pregnant either by abstinence or pregnancy test. Followup for annual exam after returning from [place].

## 2013-03-09 ENCOUNTER — Ambulatory Visit (INDEPENDENT_AMBULATORY_CARE_PROVIDER_SITE_OTHER): Payer: 59 | Admitting: Gynecology

## 2013-03-09 ENCOUNTER — Encounter: Payer: Self-pay | Admitting: Gynecology

## 2013-03-09 ENCOUNTER — Other Ambulatory Visit (HOSPITAL_COMMUNITY)
Admission: RE | Admit: 2013-03-09 | Discharge: 2013-03-09 | Disposition: A | Discharge status: Home / Self Care | Payer: 59 | Source: Ambulatory Visit | Attending: Gynecology | Admitting: Gynecology

## 2013-03-09 VITALS — BP 120/74 | Ht 60.0 in | Wt 186.0 lb

## 2013-03-09 DIAGNOSIS — Z01419 Encounter for gynecological examination (general) (routine) without abnormal findings: Secondary | ICD-10-CM | POA: Insufficient documentation

## 2013-03-09 DIAGNOSIS — A6000 Herpesviral infection of urogenital system, unspecified: Secondary | ICD-10-CM

## 2013-03-09 DIAGNOSIS — Z1151 Encounter for screening for human papillomavirus (HPV): Secondary | ICD-10-CM | POA: Insufficient documentation

## 2013-03-09 DIAGNOSIS — Z13220 Encounter for screening for lipoid disorders: Secondary | ICD-10-CM

## 2013-03-09 LAB — CBC WITH DIFFERENTIAL/PLATELET
Basophils Absolute: 0.1 10*3/uL (ref 0.0–0.1)
Basophils Relative: 1 % (ref 0–1)
Eosinophils Absolute: 0.2 10*3/uL (ref 0.0–0.7)
Eosinophils Relative: 2 % (ref 0–5)
MCH: 28.3 pg (ref 26.0–34.0)
MCHC: 34.6 g/dL (ref 30.0–36.0)
MCV: 81.8 fL (ref 78.0–100.0)
Platelets: 395 10*3/uL (ref 150–400)
RDW: 13.3 % (ref 11.5–15.5)

## 2013-03-09 LAB — COMPREHENSIVE METABOLIC PANEL
ALT: 23 U/L (ref 0–35)
AST: 24 U/L (ref 0–37)
CO2: 30 mEq/L (ref 19–32)
Creat: 0.68 mg/dL (ref 0.50–1.10)
Total Bilirubin: 0.4 mg/dL (ref 0.3–1.2)

## 2013-03-09 LAB — LIPID PANEL
Cholesterol: 148 mg/dL (ref 0–200)
Total CHOL/HDL Ratio: 5.1 Ratio
VLDL: 41 mg/dL — ABNORMAL HIGH (ref 0–40)

## 2013-03-09 NOTE — Progress Notes (Signed)
Shaaron Golliday [birth year] [id number]        42 y.o.  G2P1011 for annual exam.  Several issues below.  Past medical history,surgical history, medications, allergies, family history and social history were all reviewed and documented in the EPIC chart.  ROS:  Performed and pertinent positives and negatives are included in the history, assessment and plan .  Exam: Kim assistant Filed Vitals:   03/09/13 1139  BP: 120/74  Height: 5' (1.524 m)  Weight: 186 lb (84.369 kg)   General appearance  Normal Skin grossly normal Head/Neck normal with no cervical or supraclavicular adenopathy thyroid normal Lungs  clear Cardiac RR, without RMG Abdominal  soft, nontender, without masses, organomegaly or hernia Breasts  examined lying and sitting without masses, retractions, discharge or axillary adenopathy. Pelvic  Ext/BUS/vagina  normal   Cervix  normal GC/Chlamydia  Uterus  anteverted, normal size, shape and contour, midline and mobile nontender   Adnexa  Without masses or tenderness    Anus and perineum  normal   Rectovaginal  normal sphincter tone without palpated masses or tenderness.    Assessment/Plan:  42 y.o. G54P1011 female for annual exam.   1. Irregular menses. Patient continues with mild menstrual irregularity. She will occasionally skip a menses and we have been using intermittent progesterone withdrawals if she goes more than 6-8 weeks without menses. LMP 02/27/2013. Patient has been followed for infertility but at this point now the size that she is ready to move forward and consider contraception. I reviewed all forms of contraception to include pill patch ring, progesterone only and IUDs. I also discussed sterilization. Given her clinical situation I think a Mirena IUD would be a great choice as it would provide contraception as well as hopeful menstrual suppression and endometrial protection given past history of endometrial hyperplasia. Patient was given literature and she's going to  review this and follow up if she decides.  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 Uses intermittent Valtrex 500 mg twice a day for several days with onset of outbreak. Has a supply at home but will call if she needs more. 3. Pap smear 2011. Pap/HPV today. No history of abnormal Pap smears previously. Plan repeat at 3-5 year intervals assuming this Pap smear is normal. 4. Mammography never. Recommended baseline screening mammogram now she agrees to schedule. 5. Health maintenance. Baseline CBC comprehensive metabolic panel lipid profile urinalysis ordered. Followup with contraceptive decision otherwise annually.  Note: This document was prepared with digital dictation and possible smart phrase technology. Any transcriptional errors that result from this process are unintentional.   Dara Lords MD, 1:07 PM 03/09/2013

## 2013-03-09 NOTE — Patient Instructions (Signed)
Call to Schedule your mammogram  Facilities in [place]: 1)  [facility], [address] Phone: [phone] 2)  [facility], [address] Phone: [phone] 3)  Dr. Bertrand at [facility]  [address] Phone: [phone]     Mammogram A mammogram is an X-ray test to find changes in a woman's breast. You should get a mammogram if:  You are 40 years of age or older  You have risk factors.   Your doctor recommends that you have one.  BEFORE THE TEST  Do not schedule the test the week before your period, especially if your breasts are sore during this time.  On the day of your mammogram:  Wash your breasts and armpits well. After washing, do not put on any deodorant or talcum powder on until after your test.   Eat and drink as you usually do.   Take your medicines as usual.   If you are diabetic and take insulin, make sure you:   Eat before coming for your test.   Take your insulin as usual.   If you cannot keep your appointment, call before the appointment to cancel. Schedule another appointment.  TEST  You will need to undress from the waist up. You will put on a hospital gown.   Your breast will be put on the mammogram machine, and it will press firmly on your breast with a piece of plastic called a compression paddle. This will make your breast flatter so that the machine can X-ray all parts of your breast.   Both breasts will be X-rayed. Each breast will be X-rayed from above and from the side. An X-ray might need to be taken again if the picture is not good enough.   The mammogram will last about 15 to 30 minutes.  AFTER THE TEST Finding out the results of your test Ask when your test results will be ready. Make sure you get your test results.  Document Released: 10/17/2008 Document Revised: 07/10/2011 Document Reviewed: 10/17/2008 [employer] Patient  Information 2012 [employer].   

## 2013-03-10 LAB — URINALYSIS W MICROSCOPIC + REFLEX CULTURE
Bacteria, UA: NONE SEEN
Bilirubin Urine: NEGATIVE
Protein, ur: NEGATIVE mg/dL
Urobilinogen, UA: 0.2 mg/dL (ref 0.0–1.0)

## 2013-04-06 ENCOUNTER — Ambulatory Visit (INDEPENDENT_AMBULATORY_CARE_PROVIDER_SITE_OTHER): Payer: 59 | Admitting: Gynecology

## 2013-04-06 ENCOUNTER — Encounter: Payer: Self-pay | Admitting: Gynecology

## 2013-04-06 DIAGNOSIS — N946 Dysmenorrhea, unspecified: Secondary | ICD-10-CM

## 2013-04-06 MED ORDER — NORETHINDRONE ACET-ETHINYL EST 1-20 MG-MCG PO TABS: 1.0000 | ORAL_TABLET | Freq: Every day | ORAL | Status: DC

## 2013-04-06 NOTE — Progress Notes (Signed)
Patient presents to discuss worsening PMS type symptoms and dysmenorrhea. Most of the past year her periods seem to be more crampy and uncomfortable with a week or so before mood swings and increasing irritability. Menses are more regular now occurring monthly with no intermenstrual bleeding and relatively light flow.  Exam with Sherrilyn Rist Asst. Abdomen soft nontender without masses guarding rebound organomegaly. Pelvic external BUS vagina with slight menstrual flow. Cervix normal. Uterus normal size midline mobile nontender. Adnexa without masses or tenderness  Assessment and plan dysmenorrhea with worsening PMS type symptoms. Start with ultrasound to rule out nonpalpable abnormality such as small endometriomas. Differential to include hormonal changes, adenomyosis, endometriosis discussed. Patient and I recently discussed contraceptive options per 03/09/2013. I again reviewed options to include low-dose oral contraceptives, progesterone only such as Depo-Provera or nexplanon and Mirena IUD. Certainly would address her dysmenorrhea and possible PMS type symptoms. Patient wants a trial of physical contraceptives. I reviewed the risks to include stroke heart attack DVT. She is not being followed for any medical issues and has never smoked. Loestrin 120 equivalent prescribed with one year refill. Patient will start today as she is on her menses and followup for ultrasound over the next month.

## 2013-04-06 NOTE — Patient Instructions (Signed)
Start on birth control pills as we discussed. Followup for ultrasound as scheduled.

## 2013-04-08 ENCOUNTER — Ambulatory Visit (INDEPENDENT_AMBULATORY_CARE_PROVIDER_SITE_OTHER): Payer: 59

## 2013-04-08 ENCOUNTER — Encounter: Payer: Self-pay | Admitting: Gynecology

## 2013-04-08 ENCOUNTER — Ambulatory Visit (INDEPENDENT_AMBULATORY_CARE_PROVIDER_SITE_OTHER): Payer: 59 | Admitting: Gynecology

## 2013-04-08 DIAGNOSIS — N946 Dysmenorrhea, unspecified: Secondary | ICD-10-CM

## 2013-04-08 DIAGNOSIS — D251 Intramural leiomyoma of uterus: Secondary | ICD-10-CM

## 2013-04-08 DIAGNOSIS — N943 Premenstrual tension syndrome: Secondary | ICD-10-CM

## 2013-04-08 NOTE — Patient Instructions (Signed)
Call if you have any questions or issues with the birth control pills. Assuming you have a good response then followup in one year for your annual exam.

## 2013-04-08 NOTE — Progress Notes (Signed)
The patient presents for ultrasound in followup of her recent office visit for dysmenorrhea and PMS symptoms.  Ultrasound shows uterus normal size. 2 small leiomyomata noted largest 21 mm. Endometrial echo 5.3 mm. Right left ovaries normal. Cul-de-sac negative.  Assessment and plan: Dysmenorrhea, PMS symptoms. Has started on low-dose oral contraceptives. Assuming she does well then she will followup in one year. Proper usage of the BCPs were reviewed with her again and options for every other to every third month withdrawal, offbrand labeling discussed

## 2013-04-14 ENCOUNTER — Telehealth: Payer: Self-pay | Admitting: *Deleted

## 2013-04-14 NOTE — Telephone Encounter (Signed)
(   pt aware you are out to the office) Pt was started on Junel 1/20 on 04/06/13 and has noticed headaches since taking pills. Pt noticed yesterday left leg calf pain and stomach discomfort which she had to take 2 tylenol x-strength pills. Pt said she started taking pills at night but then switched to morning because of the headaches, which has not helped at all. She is not going to take the pill today due to the above issues. Pt asked if this normal? Recommendations? Please advise

## 2013-04-14 NOTE — Telephone Encounter (Signed)
Recommend switch to LoLoestrin.  Can give her a sample pack.  If does ok can call for refill.  If continued issues call.

## 2013-04-15 NOTE — Telephone Encounter (Signed)
Pt informed with the below note, pt will pick samples.

## 2013-05-02 ENCOUNTER — Telehealth: Payer: Self-pay | Admitting: *Deleted

## 2013-05-02 NOTE — Telephone Encounter (Signed)
I would continue with what she is doing for now. We did an ultrasound earlier this month which showed normal-appearing ovaries. If her pain would continue then I would recommend office visit.

## 2013-05-02 NOTE — Telephone Encounter (Signed)
Pt informed with the below note. 

## 2013-05-02 NOTE — Telephone Encounter (Signed)
Pt calling to follow up from telephone encounter 04/14/13, pt said pills have been working good so far. Pt has been spotting for the last 5 days since switching pill, I told pt that she may experience some spotting. Pt did mention that she has had some left side discomfort, takes ibuprofen daily for this for about 6 days now which works well. Pt said the discomfort was prior to taking pills. Pt would like recommendations? Please advise

## 2013-05-31 ENCOUNTER — Telehealth: Payer: Self-pay | Admitting: *Deleted

## 2013-05-31 MED ORDER — NORETHIN-ETH ESTRAD-FE BIPHAS 1 MG-10 MCG / 10 MCG PO TABS: 1.0000 | ORAL_TABLET | Freq: Every day | ORAL | Status: AC

## 2013-05-31 NOTE — Telephone Encounter (Signed)
Pt is taking lo Loestrin fe sample pack working great, will need a Rx which I will send to pharmacy. Pt did asked if you would like to take only active pill or take whole pack? She asked this because her issues with irregular period, pt in second pack now and said on her first pack she skipped the last 4 pills (placebo). Please advise

## 2013-05-31 NOTE — Telephone Encounter (Signed)
Refill her through 8 2015. It's up to her whether she takes placebo or not as it would not make a difference from a bleeding standpoint. It's more just for her to remember to take a pill every day

## 2013-05-31 NOTE — Telephone Encounter (Signed)
Pt informed with the below note, rx sent for birth control pills.

## 2013-06-28 ENCOUNTER — Telehealth: Payer: Self-pay | Admitting: *Deleted

## 2013-06-28 NOTE — Telephone Encounter (Signed)
Pt called c/o left side discomfort, takes birth control pills, I told pt per TF note on 05/02/13 OV best if pain continues. Pt has appointment scheduled with nancy tomorrow.

## 2013-06-29 ENCOUNTER — Ambulatory Visit (INDEPENDENT_AMBULATORY_CARE_PROVIDER_SITE_OTHER): Payer: 59 | Admitting: Women's Health

## 2013-06-29 ENCOUNTER — Encounter: Payer: Self-pay | Admitting: Women's Health

## 2013-06-29 DIAGNOSIS — R109 Unspecified abdominal pain: Secondary | ICD-10-CM

## 2013-06-29 DIAGNOSIS — N946 Dysmenorrhea, unspecified: Secondary | ICD-10-CM

## 2013-06-29 NOTE — Progress Notes (Signed)
Patient ID: Pamela Crosby, female   DOB: Sep 18, 1970, 42 y.o.   MRN: [id number] Presents with complaint of lower left quadrant pain/pressure that has persisted over the past several months. Pain/pressure starts midcycle with probable ovulation and increases with menstrual cycle but has pain throughout the month, dull ache always. Ultrasound 04/2013 showed normal ovaries and 2 small fibroids. Was started on Lo Loestrin and reports no change in pain. Some relief with Motrin. History of kidney stones and has had a negative CT scan of kidneys. Denies vaginal discharge, urinary symptoms, constipation, nausea, blood in the stools or fever. States food does not change symptoms.  Exam: Appears well. Abdomen soft, obese, no rebound or radiation of pain. External genitalia within normal limits.  Lower left quadrant pain/pressure for 4 months  Plan: Referral to GI, will schedule appointment with Dr. Loreta Ave. Keep pain diary.

## 2013-07-04 ENCOUNTER — Telehealth: Payer: Self-pay | Admitting: *Deleted

## 2013-07-04 NOTE — Telephone Encounter (Signed)
Appointment on 07/14/13 @ 9:45 am, notes faxed to Dr.Mann office. Pt informed with the below note, notes faxed.

## 2013-07-04 NOTE — Telephone Encounter (Signed)
Message copied by Aura Camps on Mon Jul 04, 2013 12:19 PM ------      Message from: [place], [place] J      Created: Wed Jun 29, 2013  3:50 PM       Please schedule appointment with Dr Loreta Ave (GI) for low left abd pain, for atleast 4 months, increasing last few. Normal [place] 04/2013, 2 small fibrioids, constant ache left lower side. No nausea, or constipation. ------

## 2014-02-14 ENCOUNTER — Telehealth: Payer: Self-pay | Admitting: *Deleted

## 2014-02-14 MED ORDER — MEDROXYPROGESTERONE ACETATE 10 MG PO TABS: 10.0000 mg | ORAL_TABLET | Freq: Every day | ORAL | Status: AC

## 2014-02-14 MED ORDER — VALACYCLOVIR HCL 500 MG PO TABS: 500.0000 mg | ORAL_TABLET | Freq: Every day | ORAL | Status: AC

## 2014-02-14 NOTE — Telephone Encounter (Signed)
Valtrex ok.  Provera 10 mg X 10 days after negative UPT

## 2014-02-14 NOTE — Telephone Encounter (Signed)
(  pt aware you are out of the office) Pt called requesting refill on valtrex, up to date on annual I will send Rx in. Pt also said no cycle since Dec 02, 2013 pt said you would normally give her provera to start cycle? Pt in out of town now in [place]. Please advise

## 2014-02-14 NOTE — Telephone Encounter (Signed)
Pt aware with all the below.

## 2014-06-05 ENCOUNTER — Encounter: Payer: Self-pay | Admitting: Women's Health

## 2015-02-14 ENCOUNTER — Other Ambulatory Visit: Payer: Self-pay | Admitting: Gynecology

## 2015-08-05 HISTORY — PX: CHOLECYSTECTOMY: SHX55

## 2016-02-06 DIAGNOSIS — M659 Unspecified synovitis and tenosynovitis, unspecified site: Secondary | ICD-10-CM

## 2016-02-06 DIAGNOSIS — M79644 Pain in right finger(s): Secondary | ICD-10-CM | POA: Insufficient documentation

## 2016-02-06 HISTORY — DX: Unspecified synovitis and tenosynovitis, unspecified site: M65.90

## 2016-02-06 HISTORY — DX: Pain in right finger(s): M79.644

## 2016-02-06 HISTORY — DX: Synovitis and tenosynovitis, unspecified: M65.9

## 2016-08-22 DIAGNOSIS — M222X9 Patellofemoral disorders, unspecified knee: Secondary | ICD-10-CM

## 2016-08-22 HISTORY — DX: Patellofemoral disorders, unspecified knee: M22.2X9

## 2016-09-05 DIAGNOSIS — M179 Osteoarthritis of knee, unspecified: Secondary | ICD-10-CM

## 2016-09-05 DIAGNOSIS — E669 Obesity, unspecified: Secondary | ICD-10-CM

## 2016-09-05 DIAGNOSIS — S76011A Strain of muscle, fascia and tendon of right hip, initial encounter: Secondary | ICD-10-CM | POA: Insufficient documentation

## 2016-09-05 DIAGNOSIS — M1710 Unilateral primary osteoarthritis, unspecified knee: Secondary | ICD-10-CM

## 2016-09-05 HISTORY — DX: Unilateral primary osteoarthritis, unspecified knee: M17.10

## 2016-09-05 HISTORY — DX: Strain of muscle, fascia and tendon of right hip, initial encounter: S76.011A

## 2016-09-05 HISTORY — DX: Obesity, unspecified: E66.9

## 2016-09-05 HISTORY — DX: Osteoarthritis of knee, unspecified: M17.9

## 2017-03-18 DIAGNOSIS — R0602 Shortness of breath: Secondary | ICD-10-CM | POA: Insufficient documentation

## 2017-03-18 DIAGNOSIS — R9431 Abnormal electrocardiogram [ECG] [EKG]: Secondary | ICD-10-CM

## 2017-03-18 HISTORY — DX: Shortness of breath: R06.02

## 2017-03-18 HISTORY — DX: Abnormal electrocardiogram (ECG) (EKG): R94.31

## 2017-04-30 DIAGNOSIS — E6609 Other obesity due to excess calories: Secondary | ICD-10-CM

## 2017-04-30 HISTORY — DX: Body mass index (BMI) 38.0-38.9, adult: E66.09

## 2017-08-13 DIAGNOSIS — M2241 Chondromalacia patellae, right knee: Secondary | ICD-10-CM | POA: Insufficient documentation

## 2017-08-13 HISTORY — DX: Chondromalacia patellae, right knee: M22.41

## 2018-06-14 DIAGNOSIS — E538 Deficiency of other specified B group vitamins: Secondary | ICD-10-CM | POA: Insufficient documentation

## 2018-06-14 DIAGNOSIS — R7303 Prediabetes: Secondary | ICD-10-CM

## 2018-06-14 HISTORY — DX: Prediabetes: R73.03

## 2018-06-23 DIAGNOSIS — M79673 Pain in unspecified foot: Secondary | ICD-10-CM

## 2018-06-23 DIAGNOSIS — L600 Ingrowing nail: Secondary | ICD-10-CM

## 2018-06-23 HISTORY — DX: Pain in unspecified foot: M79.673

## 2018-06-23 HISTORY — DX: Ingrowing nail: L60.0

## 2019-01-17 NOTE — Progress Notes (Addendum)
Chuck Hint, MD PhD  Professor of Dermatology  [place], [place], [place] [address]  [address], [place] [address]  [email or web address]  Phone: [phone] Fax: [phone]  [facility] Department of Dermatology  Date of Visit: 01/18/2019    CC: No chief complaint on file.      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    Pt notes she was diagnosed with pemphigus vulgaris and pemphigus foliacious. Currently off of prednisone but was on prednisone 60mg  qDAY in the past with slow taper. Has been off prednisone in June 2019. Off of Cellcept in March. Recently saw a physician in [facility] and was recommended Rituxan. Took one dose and was put on hold because of COVID. Pt notes that with induction of rituxan became very itchy. Was not alleviated with benedryl. Saw Dr. Trevor Mace in [facility] (Dr. Janace Litten previous resident). Currently says that she has lesions in her mouth. Some tongue soreness.   Using a steroid dental paste.   Uses clobetasol in her scalp.    Recently  moved from [place], here to establish care  ?  Review of Systems:  Constitutional: Negative for fever and weight loss  Cardiovascular: Negative for chest pain  Respiratory: Negative for shortness of breath  GU: Negative for dysuria and hematuria  Musculoskeletal: Negative for arthritis and muscle pain.  Neuro: Negative for dizziness, seizures  Psych: The patient denies depression and anxiety  Endo: Negative  Heme: Negative for easy bruising and bleeding disorders  Eyes: Negative for eye pain and change of vision  ENT: Negative for hoarseness and sore throat  ?  Past Medical History:  No past medical history on file.  ?  Past Surgical History:  No past surgical history on file.  ?  Social History:  Social History     Socioeconomic History   . Marital status: Married     Spouse name: Not on file   . Number of children: Not  on file   . Years of education: Not on file   . Highest education level: Not on file   Occupational History   . Not on file   Social Needs   . Financial resource strain: Not on file   . Food insecurity:     Worry: Not on file     Inability: Not on file   . Transportation needs:     Medical: Not on file     Non-medical: Not on file   Tobacco Use   . Smoking status: Not on file   Substance and Sexual Activity   . Alcohol use: Not on file   . Drug use: Not on file   . Sexual activity: Not on file   Lifestyle   . Physical activity:     Days per week: Not on file     Minutes per session: Not on file   . Stress: Not on file   Relationships   . Social connections:     Talks on phone: Not on file     Gets together: Not on file     Attends religious service: Not on file     Active member of club or organization: Not on file     Attends meetings of clubs or organizations: Not on file     Relationship status: Not on file   . Intimate  partner violence:     Fear of current or ex partner: Not on file     Emotionally abused: Not on file     Physically abused: Not on file     Forced sexual activity: Not on file   Other Topics Concern   . Not on file   Social History Narrative   . Not on file     ?  Family History:  No family history on file.  ?  Allergies:  Allergies not on file  ?  Medications:  No current outpatient medications on file.     No current facility-administered medications for this visit.        Physical Examination:  There were no vitals taken for this visit.    General - NAD. A/O x 3. Mood wnl.  Full body skin exam performed including scalp, face, conjunctiva, lips, oral mucosa, neck, chest, back, abdomen, right upper, left upper, right lower, left lower extremity, hands, nails, buttocks, genitals, and the following was noted:    Pertinent Skin Findings:  L buccal mucosa: some scar tissue on L buccal mucosa.  Hyperpigmented macules on lateral tongue  Central scalp: scaly papule  Hyperpigmented reticulated velvety  patch on bilateral axilla  ?  Thyroid - No thyromegaly or nodularity grossly today  ?  Diagnostics:  None  ?  Dermatopathologic Results:  None  ?  Assessment and Plan:    #Pemphigus Vulgaris  Pt with minimal activity today. Only with some healing erosions on L buccal mucosa and scalp.   - Discussed extensively (>25 minutes) that given minimal involvement today, would not recommend systemic medications at this time. However, discussed that when/if she has a flair we could initiate cellcept/ IVIG.    #Acanthosis Nigricans  - Reassurance. Discussed that this is not related to her pemphigus.   ?  Side effects of medications have been discussed.  All questions answered and information provided.  ?  I spent greater than 50% time counseling patient, encounter time 25 minutes.  ?  RTC: PRN. Okay to overbook if she is flairing.     Resident: Arlyn Leak, MD (PGY-2)  Attending: Chuck Hint MD PhD      I have seen, evaluated, and performed a history and physical as well as formulated an assessment and plan with the the resident and I agree with the assessment and plan above.     Chuck Hint, MD, PhD

## 2019-01-18 ENCOUNTER — Ambulatory Visit: Payer: PPO | Admitting: Dermatology

## 2019-01-18 VITALS — BP 131/66 | HR 86 | Ht 60.0 in | Wt 200.0 lb

## 2019-01-18 DIAGNOSIS — L83 Acanthosis nigricans: Secondary | ICD-10-CM

## 2019-01-18 DIAGNOSIS — L100 Pemphigus vulgaris: Secondary | ICD-10-CM

## 2019-01-18 MED ORDER — MULTIVITAMIN ADULT PO
ORAL | Status: DC
Start: ? — End: 2020-04-28

## 2019-01-20 NOTE — Addendum Note (Signed)
Addended by: Chuck Hint A on: 01/20/2019 06:26 PM     Modules accepted: Level of Service

## 2019-01-26 ENCOUNTER — Telehealth: Payer: Self-pay | Admitting: Dermatology

## 2019-01-26 NOTE — Telephone Encounter (Signed)
Scheduled patient tomorrow (6/25) with Dr. Charm Barges at 2:45pm. Okay to double book per [facility].

## 2019-01-26 NOTE — Telephone Encounter (Signed)
Patient states she has lesion on mouth and was told by Dr. Charm Barges to call and schedule as soon as possible. Please call back thank

## 2019-01-27 ENCOUNTER — Ambulatory Visit: Payer: PPO | Admitting: Dermatology

## 2019-01-31 ENCOUNTER — Encounter: Payer: Self-pay | Admitting: Student in an Organized Health Care Education/Training Program

## 2019-01-31 ENCOUNTER — Telehealth: Payer: Self-pay | Admitting: Dermatology

## 2019-01-31 MED ORDER — FIRST-MOUTHWASH BLM MT SUSP
10.0000 mL | Freq: Four times a day (QID) | OROMUCOSAL | 0 refills | Status: DC
Start: 2019-01-31 — End: 2019-05-05

## 2019-01-31 NOTE — Progress Notes (Signed)
Called patient re: her "pemphigus flair".  Pt says that she has had new "pemphigus lesions" on her tongue for the last several days. Since Thursday, pt notes that she had spicy food and subsequently caused a "pemphigus flair" on her throat (although she cannot see it because it is too far down her throat). Pt said she was unable to eat solid foods for several days but is now improving (no treatments). Now able to eat solid foods today.    Discussed different treatment options- if she is truly flaring in her throat, one option would be to treat with oral corticosteroids. However unclear if she is truly flaring today or another etiology I.e. acid reflux vs. Infectious. Since she is improving, pt opted for symptomatic control for now. Rx given for magic mouth wash and pt instructed to continue to use corticosteroid dental paste (she has this already at home). Will plan for telemedicine visit with Dr. Charm Barges next week. Discussed that if she is flaring plan will be to start IVIG/ immunosuppresive therapy. Per patient she has had labs recently. Pt will bring in labs tomorrow and can review to ensure all necessary labs are included. If, not can order necessary labs prior to next visit.    Strict return precautions given- instructed patient to go the ER if she has trouble breathing or talking.

## 2019-01-31 NOTE — Telephone Encounter (Signed)
Patient has blisters, calling to be seen by Dr. Charm Barges. Notified Hinton Dyer, she will call patient back.

## 2019-02-01 NOTE — Telephone Encounter (Signed)
Dr Kathrynn Ducking reviewed case with Dr Ellouise Newer.  Per Dr Kathrynn Ducking we need to call patient today and schedule a follow up via video next week.  Dr Kathrynn Ducking is ordering magic mouthwash for symptom management as well.

## 2019-02-01 NOTE — Telephone Encounter (Signed)
Patient brought in labs per Dr Sheliah Mends note.  I have scheduled her for a Zoom visit Monday.

## 2019-02-02 ENCOUNTER — Telehealth: Payer: Self-pay | Admitting: Dermatology

## 2019-02-02 NOTE — Telephone Encounter (Signed)
Patient is calling that she is having a minor flair up and would like to see one the residents if possible today or tomorrow.

## 2019-02-02 NOTE — Telephone Encounter (Signed)
Will consult with Dr.Rojek , as Dr.Grando is out for the week. Will follow up with patient.

## 2019-02-02 NOTE — Telephone Encounter (Signed)
Dr Ellouise Newer is unable to see this patient.  Will have her keep her appointment as scheduled.  Patient has additional rash on underarms, I told her this can be reviewed at her Zoom appointment.    Patient is concerned her lesions might be gone by next week, I emphasized I have no alternative for her but am happy to overbook in the future as needed.

## 2019-02-07 ENCOUNTER — Other Ambulatory Visit
Admission: RE | Admit: 2019-02-07 | Discharge: 2019-02-07 | Disposition: A | Discharge status: Home / Self Care | Payer: PPO | Attending: Dermatology | Admitting: Dermatology

## 2019-02-07 ENCOUNTER — Telehealth (INDEPENDENT_AMBULATORY_CARE_PROVIDER_SITE_OTHER): Payer: PPO | Admitting: Dermatology

## 2019-02-07 ENCOUNTER — Telehealth: Payer: Self-pay

## 2019-02-07 DIAGNOSIS — L100 Pemphigus vulgaris: Secondary | ICD-10-CM

## 2019-02-07 DIAGNOSIS — Z79899 Other long term (current) drug therapy: Secondary | ICD-10-CM

## 2019-02-07 LAB — CBC WITH DIFF, BLOOD
ANC automated: 6.2 10*3/uL (ref 2.0–8.1)
Basophils %: 1.2 %
Basophils Absolute: 0.1 10*3/uL (ref 0.0–0.2)
Eosinophils %: 8.9 %
Eosinophils Absolute: 1 10*3/uL — ABNORMAL HIGH (ref 0.0–0.5)
Hematocrit: 38.9 % (ref 34.0–44.0)
Hgb: 13.4 G/DL (ref 11.5–15.0)
Lymphocytes %: 27.5 %
Lymphocytes Absolute: 3.1 10*3/uL (ref 0.9–3.3)
MCH: 28.7 PG (ref 27.0–33.5)
MCHC: 34.5 G/DL (ref 32.0–35.5)
MCV: 83.2 FL (ref 81.5–97.0)
MPV: 8.1 FL (ref 7.2–11.7)
Monocytes %: 7.9 %
Monocytes Absolute: 0.9 10*3/uL — ABNORMAL HIGH (ref 0.0–0.8)
Neutrophils % (A): 54.5 %
PLT Count: 371 10*3/uL (ref 150–400)
RBC: 4.68 10*6/uL (ref 3.70–5.00)
RDW-CV: 13.1 % (ref 11.6–14.4)
White Bld Cell Count: 11.4 10*3/uL — ABNORMAL HIGH (ref 4.0–10.5)

## 2019-02-07 LAB — COMPREHENSIVE METABOLIC PANEL, BLOOD
ALT: 17 U/L (ref 7–52)
AST: 17 U/L (ref 13–39)
Albumin: 4 G/DL (ref 3.7–5.3)
Alk Phos: 98 U/L (ref 34–104)
BUN: 7 mg/dL (ref 7–25)
Bilirubin, Total: 0.3 mg/dL (ref 0.0–1.4)
CO2: 29 mmol/L (ref 21–31)
Calcium: 9.2 mg/dL (ref 8.6–10.3)
Chloride: 104 mmol/L (ref 98–107)
Creat: 0.6 mg/dL (ref 0.6–1.2)
Electrolyte Balance: 5 mmol/L (ref 2–12)
Glucose: 97 mg/dL (ref 70–115)
Potassium: 4.2 mmol/L (ref 3.5–5.1)
Protein, Total: 6.8 G/DL (ref 6.0–8.3)
Sodium: 138 mmol/L (ref 136–145)
eGFR - high estimate: >60 (ref >59)
eGFR - low estimate: >60 (ref >59)

## 2019-02-07 NOTE — Telephone Encounter (Signed)
Patient given OB appointment for next Monday per Dr Charm Barges.  I checked that lab orders are in, they are and patient is aware she should come today or tomorrow.

## 2019-02-07 NOTE — Progress Notes (Addendum)
PATIENTRuther Turner  MRN: [id number]  DOB: 1970-12-23    Due to COVID-19 pandemic and a federally declared state of public health emergency, this telemedicine visit was conducted Audio+Video.   Patient confirmed their presence in the state of [place] and has consented to proceeding with telemedicine today. Total time spent was 11-20 minutes.     TELEMEDICINE APPOINTMENT    Date of Service:  02/07/19    Reason for Visit:  PV    Patient location:  home    CHIEF COMPLAINT: PV    HPI:   Anita Turner is a 48 year old year old female who presents today for pemphigus vulgaris.    Last seen 01/18/19, had minimal activity and did not recommend systemic medication at that time. In the interim, has developed lesions in the mouth and tongue is sore.      Pt notes she was diagnosed with pemphigus vulgaris and pemphigus foliacious. Currently off of prednisone but was on prednisone 60mg  qDAY in the past with slow taper. Has been off prednisone in June 2019. Off of Cellcept in March. Recently saw a physician in [facility] and was recommended Rituxan. Took one dose and was put on hold because of COVID. Pt notes that with induction of rituxan became very itchy. Was not alleviated with benedryl. Saw Dr. Trevor Turner in [facility] (Dr. Janace Litten previous resident). Currently says that she has lesions in her mouth. Some tongue soreness.   Using a steroid dental paste.   Uses clobetasol in her scalp.    Recently  moved from [place], here to establish care    ROS:   No other itchy, tender, growing, or bleeding lesions.      Past Medical:   has no past medical history on file.    Social History:   has no history on file for tobacco, alcohol, and drug.    Family History:  family history is not on file.    Home Medications:  has a current medication list which includes the following prescription(s): maalox-lidocaine viscous-diphenhydramine 1:1:1 ratio and multiple vitamins-minerals.    Allergies:   Patient has no allergy information on  record.      PE:  GENERAL: Well developed, well nourished, in no acute distress. A/O x 3.    Skin Exam: Skin type: IV  Physical exam was conducted via ZOOM video. Exam described below may not be an accurate representation of actual skin lesions due to nature of how the exam was conducted, i.e. not in-person, in setting of telemedicine. Exam described below is to the best of our abilities based on visit method:    - erosions in oral mucosa    Labs/Imaging:   None      Assessment & Plan:   #Pemphigus Vulgaris  Diagnosed previously by outside dermatologist as pemphigus vulgaris and pemphigus foliaceus. Previously treated with prednisone (was on 60mg  daily in past with slow taper, off since June 2019), cellcept (off since March 2020). Saw Dr. Rebecka Apley in [place]. Received one dose of rituxan and put on hold due to [place].   No lesions at last visit, but now with recurrence of erosions in oral mucosa.  No available diagnotic tests for [place] to review/confirms blistering disorder.   - labs: pemphigus, pemphigoid, IgA, CBC, CMP  - discuss consider systemic treatment next visit after in person eval    Patient given the opportunity to ask questions.  Side effects of medications have been discussed.  Sun precautions discussed and emphasized with the  patient.    RTC: next Mon in person    Resident: Lorenza Chick, MD    This telemedicine visit was conducted with attending Dr. Charm Barges, who agrees with diagnosis and treatment plan.       I have seen, evaluated, and performed a history and physical as well as formulated an assessment and plan with the the resident and I agree with the assessment and plan above.     Chuck Hint, MD, PhD        Patient Questionnaire Responses on 02/07/2019   Are you in state of [place]?: Yes  Do you consent to proceed with the Video Visit today?: Yes

## 2019-02-07 NOTE — Patient Instructions (Signed)
Labs: pemphigus, pemphigoid, IgA, CBC, CMP

## 2019-02-09 LAB — IGA, BLOOD: Immunoglobulin A: 203 mg/dL (ref 84–499)

## 2019-02-10 ENCOUNTER — Telehealth: Payer: Self-pay | Admitting: Dermatology

## 2019-02-10 NOTE — Telephone Encounter (Signed)
I spoke with patient.  She understands this insurance change just came down today and was less frustrated knowing this was the soonest we could notify her.  Anita Turner would like to keep her appointment and pay cash.  She wishes to establish long care with Dr Charm Barges for her pemphigus.  She would still appreciate a call from our manager too.

## 2019-02-10 NOTE — Telephone Encounter (Signed)
Caller requesting to have a call back from manager, Per caller he was advised that as of today we are not accepting his insurance and he was not advised at the time of the appointment. Please assist.

## 2019-02-10 NOTE — Telephone Encounter (Signed)
Printed for [employer] to review

## 2019-02-11 LAB — PEMPHIGUS ANTIBODY PANEL

## 2019-02-11 LAB — PEMPHIGOID ANTIBODY PANEL

## 2019-02-11 NOTE — Telephone Encounter (Signed)
sPOKE WITH PATIENT YESTERDAY PRIOR TO LEAVING NO FURTHER ACTION NEEDED.

## 2019-02-13 NOTE — Progress Notes (Addendum)
Chuck Hint, MD PhD  Professor of Dermatology  [place], [place], [place] [address]  [address], [place] [address]  [email or web address]  Phone: [phone] Fax: [phone]  [facility] Department of Dermatology    Date of Service:  02/07/19    CHIEF COMPLAINT:   Chief Complaint   Patient presents with   . Derm Problem     Follow up labs        HPI:   Anita Turner is a 48 year old year old female who presents today for pemphigus vulgaris.    Last seen via televisit 02/07/2019, at which time plan was to get pemphigus labs and eval in person before starting systemics.    Today patient states that she started developing oral lesions 2 weeks ago. Initially tongue involvement and then started involving gums and lips. Using magic mouthwash currently. Previously used TAC dental paste for flares, but not using currently. Prior flares would only last 3-4 days, but this time it's longer. No skin involvement, just itchy ears.     Last regimen was CellCept 543m daily, but then ran out of medications, could not reach her previous doctor due to Covid-19, so self-discontinued. 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 Currently off of prednisone but was on prednisone 685mqDAY in the past with slow taper. Has been off prednisone in June 2019. Off of Cellcept in March. [facility] Took one dose and was put on hold because of COVID. Pt notes that with induction of rituxan became very itchy. Was not alleviated with benadryl. [facility] Currently says that she has lesions in her mouth. Some tongue soreness.   Using a steroid dental paste.   Uses clobetasol in her scalp.    [place]    ROS:   Review of Systems:  Constitutional: Negative for fever and weight loss  Cardiovascular: Negative for chest pain  Respiratory: Negative for shortness of breath  GU: Negative for dysuria and hematuria  Musculoskeletal: Negative for arthritis and muscle pain.  Neuro: Negative for dizziness, seizures  Psych: The patient denies depression and anxiety  Endo: Negative  Heme: Negative for easy bruising and bleeding disorders  Eyes: Negative for eye pain and change of vision  ENT: Negative for hoarseness and sore throat      Past Medical:   has no past medical history on file.    Social History:   has no history on file for tobacco, alcohol, and drug.    Family History:  family history is not on file.    Home Medications:  has a current medication list which includes the following prescription(s): doxycycline, hydrocortisone, maalox-lidocaine viscous-diphenhydramine 1:1:1 ratio, multiple vitamins-minerals, mycophenolate mofetil, niacinamide, and prednisone.    Allergies:   Patient has no known allergies.      PE:  GENERAL: Well developed, well nourished, in no acute distress. A/O  x 3.    Skin Exam: Skin type: IV  Full body skin exam performed including scalp, face, conjunctiva, lips, oral mucosa, neck, chest, back, abdomen, right upper, left upper, right lower, left lower extremity, hands, nails, buttocks, genitals, and the following was noted:    Pertinent Skin Findings:   - Deep fissure on anterior tongue  - Superficial mucosal erosion of posterior L buccal mucosa  - Hyperpigmented macules on lateral tongue, tongue tip, and posterior superior palate  - Hyperpigmented reticulated velvety patch on bilateral axilla  ?  Thyroid - No thyromegaly or nodularity grossly  today    Labs/Imaging:   02/07/2019    CMP wnl   CBC notable for mild leukocytosis 11.4 (stable since 09/2017 per CareEverywhere review)   IgA 203 (wnl)    02/07/2019   Basement Membrane Zone (BMZ) IgG, IgG4, and IgA  IgG: Negative, monkey esophagus substrate     Negative, human split skin substrate      IgG4: Negative, monkey esophagus substrate        Negative, human split skin substrate   IgA: Negative, monkey esophagus substrate     Negative, human split skin substrate   Positive IgG, including IgG4, cell surface antibodies,         monkey esophagus substrate     Enzyme Linked Immunosorbent Assay (ELISA)   --------------------------------------------------   Bullous Pemphigoid (BP) 180 and 230 IgG Antibodies   IgG BP 180 antibodies: 1 unit  NEGATIVE  IgG BP 230 antibodies: 1 unit  NEGATIVE    02/07/2019  Cell Surface IgG Antibodies   IgG: Positive, titer 1:2560 (H), monkey esophagus substrate     Positive, titer 1:640 (H), intact human skin substrate     Enzyme-Linked Immunosorbent Assay (ELISA)  -----------------------------------------  Desmoglein (DSG) 1 and 3 IgG Antibodies   IgG desmoglein 1 antibodies:  7 units  NEGATIVE    Reference Range:      Positive (H) = Greater than 20 units      Borderline/Indeterminate = 14-20 units      Negative = Less than 14 units   IgG desmoglein 3 antibodies: 320 units (H)  POSITIVE     (Initial level, 139 units, greater than high         calibrator; diluted to achieve 64 units, within      assay calibrators, and multiplied by the dilution      factor of 5)   COMMENTS   Specific   -------------------------------------------------   These indirect immunofluorescence results, demonstrating   positive IgG cell surface antibody reactivity on both   monkey esophagus and intact human skin substrates, support   the diagnosis of pemphigus.      Assessment & Plan:     # Pemphigus Vulgaris  Diagnosed previously by outside  dermatologist as pemphigus vulgaris and pemphigus foliaceus. Serum antibody titers on 02/07/2019 at [facility] c/w pemphigus vulgaris per above. (anti-Desmoglein 3 ab positive) Previously treated with prednisone (was on 63m daily with slow taper, off since June 2019), Cellcept (off since March 2020). [place]ceived one dose of Rituxan (09/17/18) and put on hold due to COVID.   - Currently not on any maintenance therapy  - Now with recurrence of erosions in oral mucosa.    - START prior authorization for first 2 month treatment as follows:  --- Rituxan weekly for Weeks 1, 2, and 3 and then IVIG on Week 4  --- Patient may need to get  hepatitis labs checked prior to starting infusions    -START regimen per below:  -- Prednisone 87m daily*, advised to take in the AM before breakfast  -- CellCept 10029mBID  -- Doxycycline 10039mID  -- Niacinamide 500m10mD  -- Calcium 1g supplement daily (can be acquired over the counter)  -- Multivitamin 2 tabs daily (patient already taking multivitamin)    *Regarding prednisone/chronic systemic corticosteroid use (please discuss with your primary care physician)  - DEXA scan baseline and yearly, weight bearing exercise recommended  - Check vitamin D level yearly  - Chronic use side effects (>20 mg for >3 wk) reviewed including glaucoma, cataracts, osteonecrosis of the femoral and humeral heads, pancreatitis, and panniculitis, osteoporosis, loss of muscle mass, weakness, worsening of diabetes mellitus, increased susceptibility to infection, poor wound healing, weight gain, psychiatric disturbances  - Vaccine recommendations while on medication: live vaccines (varicella, zoster, MMR) should be done at least 1 month prior to starting and cannot be administered while on medication. All other vaccinations should be up to date and yearly influenza vaccine is recommended  - Treatment with bisphosphonates while on prednisone therapy, due to the  high risk of bone loss and fracture, is recommended for:  - All patients with known osteoporosis (T-score <-2.5) on any dose of prednisone for any duration  - Men ?50 years and postmenopausal women were risk stratified using the validated FRAX tool http://www.[place].ac.uk/FRAX/tool.jsp. Treatment will be initiated for those who are:   Low risk if taking >7.5 mg/day for > 3 months duration   Medium risk if taking >5 mg/day for > 1 month duration   High risk any dose for any amount of time  - Premenopausal women (not of childbearing potential) and men <50 y83rs old with a history of a fragility fracture taking >5 mg/day for 1-3 months, or taking any dose for >3 mon  - Premenopausal women of childbearing potential taking >7.5 mg/day for >3 mon      # Acanthosis nigricans  - Discussed benign etiology and common relationship with hyperglycemia / diabetes. Patient recommended to get A1c checked at next PCP visit.  - START hydrocortisone 2.5% cream BID to AA, discussed side effects of topical steroid use including skin thinning, telangiectasia, and atrophy if used continuously for long term    Patient given the opportunity to ask questions.  Side effects of medications have been discussed.  Sun precautions discussed and emphasized with the patient.    Return to Clinic in 4 weeks or sooner prn.    The patient was seen and examined with the attending physician below, who agrees with the above assessment and plan.    Resident Physician:  LudaIsidore Moos/PhD (PGY-2)   Attending Physician:  SergChuck Hint, PhD    I have seen, evaluated, and performed a history and physical as well as formulated an assessment and plan with the the resident and I agree with the assessment and plan above.     SergChuck Hint, PhD

## 2019-02-14 ENCOUNTER — Ambulatory Visit (INDEPENDENT_AMBULATORY_CARE_PROVIDER_SITE_OTHER): Payer: Self-pay | Admitting: Dermatology

## 2019-02-14 ENCOUNTER — Encounter: Payer: Self-pay | Admitting: Dermatology

## 2019-02-14 ENCOUNTER — Telehealth: Payer: Self-pay

## 2019-02-14 VITALS — BP 140/77 | HR 68 | Ht 60.0 in | Wt 203.0 lb

## 2019-02-14 DIAGNOSIS — L83 Acanthosis nigricans: Secondary | ICD-10-CM

## 2019-02-14 DIAGNOSIS — L100 Pemphigus vulgaris: Secondary | ICD-10-CM

## 2019-02-14 DIAGNOSIS — Z79899 Other long term (current) drug therapy: Secondary | ICD-10-CM

## 2019-02-14 MED ORDER — NIACINAMIDE ER 500 MG PO TBCR
500.0000 mg | EXTENDED_RELEASE_TABLET | Freq: Three times a day (TID) | ORAL | 11 refills | Status: DC
Start: 2019-02-14 — End: 2020-04-28

## 2019-02-14 MED ORDER — MYCOPHENOLATE MOFETIL 500 MG OR TABS
1000.0000 mg | ORAL_TABLET | Freq: Two times a day (BID) | ORAL | 11 refills | Status: DC
Start: 2019-02-14 — End: 2019-05-05

## 2019-02-14 MED ORDER — DOXYCYCLINE HYCLATE 100 MG OR TABS
100.0000 mg | ORAL_TABLET | Freq: Two times a day (BID) | ORAL | 11 refills | Status: DC
Start: 2019-02-14 — End: 2019-02-14

## 2019-02-14 MED ORDER — PREDNISONE 20 MG OR TABS
80.0000 mg | ORAL_TABLET | Freq: Every day | ORAL | 11 refills | Status: DC
Start: 2019-02-14 — End: 2019-05-05

## 2019-02-14 MED ORDER — HYDROCORTISONE 2.5 % EX CREA
1.0000 | TOPICAL_CREAM | Freq: Two times a day (BID) | CUTANEOUS | 3 refills | Status: DC
Start: 2019-02-14 — End: 2019-05-05

## 2019-02-14 MED ORDER — DOXYCYCLINE HYCLATE 100 MG OR TABS
100.0000 mg | ORAL_TABLET | Freq: Two times a day (BID) | ORAL | 11 refills | Status: DC
Start: 2019-02-14 — End: 2019-05-05

## 2019-02-14 NOTE — Patient Instructions (Addendum)
Your plan for Pemphigus Vulgaris:    We would like to start you on a multidrug protocol. About 85% of patients that go through this treatment course are cured, without flare for 5 years. This treatment course will last about 2 years.    Regimen:  - Prednisone 80mg  daily  - Doxycyline 100 mg twice daily  - Niacinamide 500 mg three times daily  - Cellcept 1000mg  twice daily  - Calcium supplement 1 gram daily (can be obtained over the counter)  - 2 multivitamin tabs daily (can be obtained over the counter)    For the first 2 months:  - Rituxan weekly for Weeks 1, 2, and 3  - Intravenous immunoglobulin (IVIg) on Week 4    For the rash under your arms: ACANTHOSIS NIGRICANS  - START hydrocortisone 2.5% cream twice a day to the affected areas    What to avoid:  - stress  - low and high outside temperature  - strenous exercise  - sunlight  - these medications: ACE inhibitors, penicillin derivatives    You need to establish a primary care physician at [facility] to watch for potential hypertension, diabetes, or cardiovascular problems. You also need a DEXA scan for baseline bone density.    If you have any blisters, you can pop them with a sterile needle until they lay flat and then apply steroid cream to the site twice a day.    Please make sure to get any necessary lab testing done before your next visit.

## 2019-02-14 NOTE — Telephone Encounter (Signed)
Initial IVIG orders faxed to [place].

## 2019-02-24 NOTE — Telephone Encounter (Signed)
Per [employer]: IVIG approved, Rituxan pending however patient notified us that their insurance plan will be changing to a plan that they can use with Dr. Charm Barges. So we may need to re-submit auth with the new plan which becomes active 03/05/2019.     Will await further updates.

## 2019-02-25 ENCOUNTER — Telehealth: Payer: Self-pay | Admitting: Dermatology

## 2019-02-25 NOTE — Telephone Encounter (Signed)
Called Dr Prudencio Pair back and let her know Dr Charm Barges wishes to proceed with the 1 gram every 6 months.  Dr Prudencio Pair said great, her nurse will rewrite the request and it will be assigned a new PA number but will be approved.  [employer] aware.

## 2019-02-25 NOTE — Telephone Encounter (Signed)
Spoke with Dr Prudencio Pair- she explained the Rituxan denial further.  The patients insurance approves Rituxan as follows:  1 gram at initial dose and subsquent gram at 2 weeks.  Then 12 months later UP TO 1 GM and every 6 months moving forward.    However this patient has previously been on Rituxan with her insurance.  So Dr Prudencio Pair states what she can approve at this time is the up to 1 gram every 6 months.  I have asked Dr Charm Barges how he would like to proceed.

## 2019-02-25 NOTE — Telephone Encounter (Signed)
Retuxin prescription the dosage will be denied a prior auth was submitted but not covered please contact Dr. Kateri Plummer from [place] department at [phone].  Thank you

## 2019-02-28 ENCOUNTER — Telehealth: Payer: Self-pay | Admitting: Dermatology

## 2019-02-28 NOTE — Telephone Encounter (Signed)
Spoke with patient who stated that Dr.Grando told her to call the clinic if she gets lesions after taking the antibiotics. Discussed it with Stanton Kidney and Stanton Kidney gave me the ok to [facility] patient tomorrow 03/01/2019 at 1600. Patient is aware of appointment. Patient stated that she had insurance issues last time she came in and had to my out of pocket. Insurance is currently not in epic. I will discuss insurance with front office.

## 2019-02-28 NOTE — Telephone Encounter (Signed)
PATIENT HAS LESIONS IN MOUTH, AFTER TAKING ANTIBIOTICS. DR. Charm Barges ADVISED HER TO CALL us IF SHE NOTICED ANY DIFFERENCE IN HER SKIN AFTER MEDS.  SHE IS CURRENTLY SCHEDULED FOR 8/10, HOWEVER, SHE  NEEDS NEXT AVAILABLE APPT.  PATIENT OK WITH BEING PROCESSED AS SELF PAY. TRIED NURSE LINE, NURSE NOT AVAILABLE.

## 2019-02-28 NOTE — Telephone Encounter (Signed)
Dr Charm Barges has changed Rituxan dosing- see 02/25/2019 note.

## 2019-03-01 ENCOUNTER — Ambulatory Visit (INDEPENDENT_AMBULATORY_CARE_PROVIDER_SITE_OTHER): Payer: Self-pay | Admitting: Dermatology

## 2019-03-01 ENCOUNTER — Telehealth: Payer: Self-pay

## 2019-03-01 VITALS — BP 118/69 | HR 73

## 2019-03-01 DIAGNOSIS — L100 Pemphigus vulgaris: Secondary | ICD-10-CM

## 2019-03-01 NOTE — Patient Instructions (Addendum)
Please stop cellcept on first day of rituxan infusion.  Please continue prednisone 80 mg daily for the next 2 weeks (until 8/11), and then   decrease to 70 mg (3.5 tablets) every morning for 3 weeks.   We will see you in 5 weeks.

## 2019-03-01 NOTE — Progress Notes (Addendum)
Chuck Hint, MD PhD  Professor of Dermatology  [place], [place], [place] [address]  [address], [place] [address]  [email or web address]  Phone: [phone] Fax: [phone]  [facility] Department of Dermatology    Date of Service:  02/07/19    CHIEF COMPLAINT:   No chief complaint on file.       HPI:   Anita Turner is a 48 year old year old female who presents today for pemphigus vulgaris after starting systemic therapy recently in the setting of flare while off maintenance therapy.    LV 02/14/2019 - patient planned to start rituxan weekly for weeks 1, 2,and 3, and then IVIG on week 4; however has not been able to start yet. Started prednisone 80 mg daily, cellcept 1g BID, doxy 100 mg BID, niacinamide, [place]/MV.    Today, has some tingling sensation in mouth but no visible lesions. Also has similar tingling sensation at intergluteal fold but cannot see if any lesions. Hasn't gotten rituxan and IVIG yet. New insurance will start Aug 1.      Per prior note:  Pt notes she was diagnosed with pemphigus vulgaris and pemphigus foliacious. Currently off of prednisone but was on prednisone 76m qDAY in the past with slow taper. Has been off prednisone in June 2019. Off of Cellcept in March. [facility] Took one dose and was put on hold because of COVID. Pt notes that with induction of rituxan became very itchy. Was not alleviated with benadryl. [facility](Dr. GJanace Littenprevious resident). Currently says that she has lesions in her mouth. Some tongue soreness.   Using a steroid dental paste.   Uses clobetasol in her scalp.    Developed oral lesions ~early July 2020. Initially tongue involvement and then started involving gums and lips. Used magic mouthwash, TAC dental paste for flares. Prior flares would only last 3-4 days, but this time it's longer. No skin involvement, just itchy ears.        [place]    ROS:   Review of Systems:  Constitutional: Negative for fever and weight loss  Cardiovascular: Negative for chest pain  Respiratory: Negative for shortness of breath  GU: Negative for dysuria and hematuria  Musculoskeletal: Negative for arthritis and muscle pain.  Neuro: Negative for dizziness, seizures  Psych: The patient denies depression and anxiety  Endo: Negative  Heme: Negative for easy bruising and bleeding disorders  Eyes: Negative for eye pain and change of vision  ENT: Negative for hoarseness and sore throat      Past Medical:   has no past medical history on file.    Social History:   has no history on file for tobacco, alcohol, and drug.    Family History:  family history is not on file.    Home Medications:  has a current medication list which includes the following prescription(s): doxycycline, hydrocortisone, maalox-lidocaine viscous-diphenhydramine 1:1:1 ratio, multiple vitamins-minerals, mycophenolate mofetil, niacinamide, and prednisone.    Allergies:   Patient has no known allergies.      PE:  GENERAL: Well developed, well nourished, in no acute distress. A/O x 3.    Skin Exam: Skin type: IV  Full body skin exam performed including scalp, face, conjunctiva, lips, oral mucosa, neck, chest, back, abdomen, right upper, left upper, right lower, left lower extremity, hands,  nails, buttocks, genitals, and the following was noted:    Pertinent Skin Findings:   - no oral lesions today  - small fissure-like erosion at intergluteal cleft <0.5 cm  ?  Thyroid - No thyromegaly or nodularity grossly today    Labs/Imaging:   02/07/2019    CMP wnl   CBC notable for mild leukocytosis 11.4 (stable since 09/2017 per [facility] review)   IgA 203 (wnl)    02/07/2019   Basement Membrane Zone (BMZ) IgG, IgG4, and IgA  IgG: Negative, monkey esophagus substrate     Negative, human split skin substrate      IgG4: Negative, monkey esophagus substrate         Negative, human split skin substrate   IgA: Negative, monkey esophagus substrate     Negative, human split skin substrate   Positive IgG, including IgG4, cell surface antibodies,         monkey esophagus substrate     Enzyme Linked Immunosorbent Assay (ELISA)   --------------------------------------------------   Bullous Pemphigoid (BP) 180 and 230 IgG Antibodies   IgG BP 180 antibodies: 1 unit  NEGATIVE  IgG BP 230 antibodies: 1 unit  NEGATIVE    02/07/2019  Cell Surface IgG Antibodies   IgG: Positive, titer 1:2560 (H), monkey esophagus substrate     Positive, titer 1:640 (H), intact human skin substrate     Enzyme-Linked Immunosorbent Assay (ELISA)  -----------------------------------------  Desmoglein (DSG) 1 and 3 IgG Antibodies   IgG desmoglein 1 antibodies:  7 units  NEGATIVE    Reference Range:      Positive (H) = Greater than 20 units      Borderline/Indeterminate = 14-20 units      Negative = Less than 14 units   IgG desmoglein 3 antibodies: 320 units (H)  POSITIVE     (Initial level, 139 units, greater than high         calibrator; diluted to achieve 64 units, within      assay calibrators, and multiplied by the dilution      factor of 5)   COMMENTS   Specific   -------------------------------------------------   These indirect immunofluorescence results, demonstrating   positive IgG cell surface antibody reactivity on both   monkey esophagus and intact human skin substrates, support   the diagnosis of pemphigus.      Assessment & Plan:     # Pemphigus Vulgaris  Diagnosed previously by outside dermatologist as pemphigus vulgaris and pemphigus foliaceus. Serum antibody titers on 02/07/2019 at [facility] c/w pemphigus vulgaris per above. (anti-Desmoglein 3 ab positive) Previously treated with prednisone (was on 14m daily with slow taper, off since June 2019), Cellcept (off since March 2020). [place]ceived one  dose of Rituxan (09/17/18) and [place]   - Developed recurrence of erosions in oral mucosa, resolved after starting prednisone, however with very small region in intergluteal cleft.    - CONTINUE prior authorization for first 2 month treatment as follows:  --- Rituxan weekly for Weeks 1, 2, and 3 and then IVIG on Week 4. Has not been able to start yet, new insurance will start Aug 1.  --- Patient may need to get hepatitis labs checked prior to starting infusions    -CONTINUE regimen per below:  -- Continue prednisone 80 mg daily qAM for the next 2 weeks (until 8/11), and then decrease to 70 mg (3.5 tablets) every morning for 3 weeks. Then RTC  in 5 weeks.  -- CellCept 1014m BID. Stop cellcept on first day of rituxan infusion.  -- Doxycycline 1077mBID  -- Niacinamide 50036mID  -- Calcium 1g supplement daily (can be acquired over the counter)  -- Multivitamin 2 tabs daily (patient already taking multivitamin)    *Regarding prednisone/chronic systemic corticosteroid use (please discuss with your primary care physician)  - DEXA scan baseline and yearly, weight bearing exercise recommended  - Check vitamin D level yearly  - Chronic use side effects (>20 mg for >3 wk) reviewed including glaucoma, cataracts, osteonecrosis of the femoral and humeral heads, pancreatitis, and panniculitis, osteoporosis, loss of muscle mass, weakness, worsening of diabetes mellitus, increased susceptibility to infection, poor wound healing, weight gain, psychiatric disturbances  - Vaccine recommendations while on medication: live vaccines (varicella, zoster, MMR) should be done at least 1 month prior to starting and cannot be administered while on medication. All other vaccinations should be up to date and yearly influenza vaccine is recommended  - Treatment with bisphosphonates while on prednisone therapy, due to the high risk of bone loss and fracture, is recommended for:  - All patients with known osteoporosis (T-score  <-2.5) on any dose of prednisone for any duration  - Men ?50 years and postmenopausal women were risk stratified using the validated FRAX tool http://www.[place].ac.uk/FRAX/tool.jsp. Treatment will be initiated for those who are:   Low risk if taking >7.5 mg/day for > 3 months duration   Medium risk if taking >5 mg/day for > 1 month duration   High risk any dose for any amount of time  - Premenopausal women (not of childbearing potential) and men <50 38ars old with a history of a fragility fracture taking >5 mg/day for 1-3 months, or taking any dose for >3 mon  - Premenopausal women of childbearing potential taking >7.5 mg/day for >3 mon      # Acanthosis nigricans  - Discussed benign etiology and common relationship with hyperglycemia / diabetes. Patient recommended to get A1c checked at next PCP visit.  - CONTINUE hydrocortisone 2.5% cream BID to AA, discussed side effects of topical steroid use including skin thinning, telangiectasia, and atrophy if used continuously for long term    Patient given the opportunity to ask questions.  Side effects of medications have been discussed.  Sun precautions discussed and emphasized with the patient.    Return to Clinic in 5 weeks or sooner prn.    The patient was seen and examined with the attending physician below, who agrees with the above assessment and plan.    Resident: DebClaretta FraiseD PGY-2  Attending Physician:  SerChuck HintD, PhD     I have seen, evaluated, and performed a history and physical as well as formulated an assessment and plan with the the resident and I agree with the assessment and plan above.     SerChuck HintD, PhD

## 2019-03-01 NOTE — Telephone Encounter (Signed)
Received a fax from [place] that pt's medication was approved for coverage:    Medication: Octagam 500 mg    Approved Duration: 02/15/2019 to 08/18/2019    Case ID / Reference #: [id number]    Contact information:   Phone: [phone]      [employer] notified, letter labeled and sent to scan into pt's chart.

## 2019-03-02 NOTE — Addendum Note (Signed)
Addended by: Chuck Hint A on: 03/02/2019 11:12 AM     Modules accepted: Level of Service

## 2019-03-04 ENCOUNTER — Telehealth: Payer: Self-pay | Admitting: Dermatology

## 2019-03-04 NOTE — Telephone Encounter (Signed)
Per Nicole Kindred this is related to the Rituxan infusion yesterday- patient became itchy in her throat.  Rituxan was stopped.  Dr Charm Barges was called as well.  Nicole Kindred was just letting us know patient declined 911 as Dr Charm Barges requested but that he has checked on patient today and she is fine.  I will pass along to Dr Charm Barges; [employer]'s nursing will also be sending [place] a report of the episode.

## 2019-03-04 NOTE — Telephone Encounter (Signed)
Anita Turner from [place] calling in to follow up with Dr.Grando, he says pt received maybe 20% of the infusion yesterday until it was stopped due to the throat and ear itchy feeling, pt refused to go to the ER or for 911 to be called.

## 2019-03-07 ENCOUNTER — Telehealth: Payer: Self-pay | Admitting: Dermatology

## 2019-03-07 NOTE — Telephone Encounter (Signed)
Please see previous message. Patient returning [place] call. Please assist.

## 2019-03-07 NOTE — Telephone Encounter (Signed)
Per Dr Charm Barges, patient should receive her next dose at an infusion center to be safe.  She should also be infused at the slowest rate and receive solumedrol.    Kalman Shan with this update.  Patient is actually not due again for 6 months, however Nicole Kindred noted patient only got 200 mg of a 1000 mg dose.  His advice was we should wait the 6 months as insurance could be problematic in receiving a repeat dose.  I will discuss with Dr Charm Barges.

## 2019-03-07 NOTE — Telephone Encounter (Signed)
LMTCB to discuss plan of care- per Dr Charm Barges we will not try to complete the dose but rather try again in 6 months, at an infusion center.

## 2019-03-07 NOTE — Telephone Encounter (Signed)
Spoke with Nafisa.  We discussed that her Rituxan is every 6 months so she actually is not due for awhile.  However when due she understands she will go to a infusion center.  [place]hel[place] is scheduled for IVIG in a couple weeks.  She is working on getting her new insurance information for [place].

## 2019-03-11 NOTE — Telephone Encounter (Signed)
Called Whitni, LM, need to confirm that she knows to continue her cellcept moving forward.

## 2019-03-14 ENCOUNTER — Telehealth: Payer: Self-pay | Admitting: Dermatology

## 2019-03-14 ENCOUNTER — Ambulatory Visit: Payer: PPO | Admitting: Dermatology

## 2019-03-14 NOTE — Telephone Encounter (Signed)
Patient is returning Lyndsy's call concerning her prescription. Please call back thank you

## 2019-03-14 NOTE — Telephone Encounter (Signed)
Called patient back. Patient had a question about Forrestine Him message she left her. Told her that she will continue with her cellcept moving forward. Also patient will be decreasing her prednisone down to 70mg  (3.5tabs) for 3 weeks and we will follow up with her on 9/1.  Patient also wanted to let us know she is planning on starting IVIG on 8/17. Told patient to call if any other questions.

## 2019-03-17 ENCOUNTER — Encounter: Payer: Self-pay | Admitting: Dermatology

## 2019-03-17 ENCOUNTER — Telehealth: Payer: Self-pay | Admitting: Dermatology

## 2019-03-17 ENCOUNTER — Ambulatory Visit: Payer: PPO | Admitting: Dermatology

## 2019-03-17 VITALS — BP 120/72 | HR 77

## 2019-03-17 DIAGNOSIS — Z79899 Other long term (current) drug therapy: Secondary | ICD-10-CM

## 2019-03-17 DIAGNOSIS — L83 Acanthosis nigricans: Secondary | ICD-10-CM

## 2019-03-17 DIAGNOSIS — L309 Dermatitis, unspecified: Secondary | ICD-10-CM

## 2019-03-17 DIAGNOSIS — L100 Pemphigus vulgaris: Secondary | ICD-10-CM

## 2019-03-17 DIAGNOSIS — B370 Candidal stomatitis: Secondary | ICD-10-CM

## 2019-03-17 MED ORDER — CLOTRIMAZOLE 10 MG MT LOZG
1.0000 | LOZENGE | Freq: Every day | OROMUCOSAL | 11 refills | Status: DC
Start: 2019-03-17 — End: 2019-05-05

## 2019-03-17 MED ORDER — CLOTRIMAZOLE-BETAMETHASONE 1-0.05 % EX CREA
1.0000 | TOPICAL_CREAM | Freq: Two times a day (BID) | CUTANEOUS | 11 refills | Status: DC
Start: 2019-03-17 — End: 2019-05-05

## 2019-03-17 NOTE — Telephone Encounter (Signed)
Patient reports she is having cotton feeling in her mouth- however not like dryness, like a feeling as if something is in her mouth?  She denies blisters yet reports a patch in her mouth that is "all over".  She also said her gums feel and look strange.  I told her that with all this information and her history of the disease process affecting her mouth I cannot easily distinguish between a flare and medication side effects.  I asked the patient to come in today at 4 pm.

## 2019-03-17 NOTE — Telephone Encounter (Signed)
Patient is returning [place] call. Please call back thank you

## 2019-03-17 NOTE — Progress Notes (Addendum)
Chuck Hint, MD PhD  Professor of Dermatology  [place], [place], [place] [address]  [address], [place] [address]  [email or web address]  Phone: [phone] Fax: [phone]  [facility] Department of Dermatology    Date of Service:  02/07/19    CHIEF COMPLAINT:   Chief Complaint   Patient presents with   . Derm Problem     Follow up pemphigus        HPI:   Anita Turner is a 48 year old year old female who presents today for pemphigus vulgaris after starting systemic therapy recently in the setting of flare while off maintenance therapy.    LV 03/01/2019     -- Prednisone 80mg  --> 70mg    -- CellCept 1000mg  BID.   -- Doxycycline 100mg  BID  -- Niacinamide 500mg  TID  -- Calcium 1g supplement daily (can be acquired over the counter)  -- Multivitamin 2 tabs daily (patient already taking multivitamin)    Currently taking 70mg  steroid until 04/05/2019 and further taper at later appointment.    Patient noted she had tingling in the throat and abnormal tingling sensation in the ear before when she received a rituxan infusion at an infusion center in [facility] over a year ago. However, more recently approximately 2 weeks ago patient was receiving home dose of rituxan and she also experienced similar symptoms, was instructed by home nurse to go to ER, but was given benadryl and recovered after 10 minutes.     Only other symptoms was an abnormal sensation on the mouth that makes it difficult to eat.     Today, has some tingling sensation in mouth but no visible lesions. Also has similar tingling sensation at intergluteal fold but cannot see if any lesions. Hasn't gotten rituxan and IVIG yet. New insurance will start Aug 1.      Per prior note:  Pt notes she was diagnosed with pemphigus vulgaris and pemphigus foliacious. Currently off of prednisone but was on prednisone 60mg  qDAY in the past with slow taper. Has been off prednisone in June 2019. Off of Cellcept in March. Recently  saw a physician in [facility] and was recommended Rituxan. Took one dose and was put on hold because of COVID. Pt notes that with induction of rituxan became very itchy. Was not alleviated with benadryl. Saw Dr. Trevor Mace in [facility] (Dr. Janace Litten previous resident). Currently says that she has lesions in her mouth. Some tongue soreness.   Using a steroid dental paste.   Uses clobetasol in her scalp.    Developed oral lesions ~early July 2020. Initially tongue involvement and then started involving gums and lips. Used magic mouthwash, TAC dental paste for flares. Prior flares would only last 3-4 days, but this time it's longer. No skin involvement, just itchy ears.       Recently  moved from [place], here to establish care    ROS:   Review of Systems:  Constitutional: Negative for fever and weight loss  Cardiovascular: Negative for chest pain  Respiratory: Negative for shortness of breath  GU: Negative for dysuria and hematuria  Musculoskeletal: Negative for arthritis and muscle pain.  Neuro: Negative for dizziness, seizures  Psych: The patient denies depression and anxiety  Endo: Negative  Heme: Negative for easy bruising and bleeding disorders  Eyes: Negative for eye pain and change of vision  ENT: Negative for hoarseness and sore throat      Past Medical:  has no past medical history on file.    Social History:   has no history on file for tobacco, alcohol, and drug.    Family History:  family history is not on file.    Home Medications:  has a current medication list which includes the following prescription(s): doxycycline, hydrocortisone, maalox-lidocaine viscous-diphenhydramine 1:1:1 ratio, multiple vitamins-minerals, mycophenolate mofetil, niacinamide, and prednisone.    Allergies:   Patient has no known allergies.      PE:  GENERAL: Well developed, well nourished, in no acute distress. A/O x 3.    Skin Exam: Skin type: IV  Full body skin exam performed including scalp, face, conjunctiva, lips, oral mucosa,  neck, chest, back, abdomen, right upper, left upper, right lower, left lower extremity, hands, nails, buttocks, genitals, and the following was noted:    Pertinent Skin Findings:   - no oral lesions today  - small fissure-like erosion at intergluteal cleft <0.5 cm  ?  Thyroid - No thyromegaly or nodularity grossly today    Labs/Imaging:   02/07/2019    CMP wnl   CBC notable for mild leukocytosis 11.4 (stable since 09/2017 per CareEverywhere review)   IgA 203 (wnl)    02/07/2019   Basement Membrane Zone (BMZ) IgG, IgG4, and IgA  IgG: Negative, monkey esophagus substrate     Negative, human split skin substrate      IgG4: Negative, monkey esophagus substrate        Negative, human split skin substrate   IgA: Negative, monkey esophagus substrate     Negative, human split skin substrate   Positive IgG, including IgG4, cell surface antibodies,         monkey esophagus substrate     Enzyme Linked Immunosorbent Assay (ELISA)   --------------------------------------------------   Bullous Pemphigoid (BP) 180 and 230 IgG Antibodies   IgG BP 180 antibodies: 1 unit  NEGATIVE  IgG BP 230 antibodies: 1 unit  NEGATIVE    02/07/2019  Cell Surface IgG Antibodies   IgG: Positive, titer 1:2560 (H), monkey esophagus substrate     Positive, titer 1:640 (H), intact human skin substrate     Enzyme-Linked Immunosorbent Assay (ELISA)  -----------------------------------------  Desmoglein (DSG) 1 and 3 IgG Antibodies   IgG desmoglein 1 antibodies:  7 units  NEGATIVE    Reference Range:      Positive (H) = Greater than 20 units      Borderline/Indeterminate = 14-20 units      Negative = Less than 14 units   IgG desmoglein 3 antibodies: 320 units (H)  POSITIVE     (Initial level, 139 units, greater than high         calibrator; diluted to achieve 64 units, within      assay calibrators, and multiplied by the dilution      factor of 5)   COMMENTS   Specific      -------------------------------------------------   These indirect immunofluorescence results, demonstrating   positive IgG cell surface antibody reactivity on both   monkey esophagus and intact human skin substrates, support   the diagnosis of pemphigus.      Assessment & Plan:     # Pemphigus Vulgaris  Diagnosed previously by outside dermatologist as pemphigus vulgaris and pemphigus foliaceus. Serum antibody titers on 02/07/2019 at [facility] c/w pemphigus vulgaris per above. (anti-Desmoglein 3 ab positive) Previously treated with prednisone (was on 60mg  daily with slow taper, off since June 2019), Cellcept (off since March 2020). Previously followed with Dr. Rebecka Apley  in [place]. Received one dose of Rituxan (09/17/18) and put on hold due to [place].   - Developed recurrence of erosions in oral mucosa, resolved after starting prednisone, however with very small region in intergluteal cleft.    Labs: last labs 02/07/2019 reviewed. Patient may need to get hepatitis labs checked prior to starting infusions    Tx:  - Prescribed - physical prescription: Rituxan weekly for Weeks 1, 2, and 3 and then IVIG on Week 4. Has not been able to start yet, new insurance will start Aug 1.  -- solumedrol push and IV benadryl ordered prior to rituxan administration  -- IVIG monthly  -- Continue prednisone 70mg  qAM (decrease from 80mg ) will continue with taper to 60mg  in start of September  -- CellCept 1000mg  BID. PT INSTRUCTED Stop cellcept on first day of rituxan infusion.  -- Doxycycline 100mg  BID  -- Niacinamide 500mg  TID  -- Calcium 1g supplement daily (can be acquired over the counter)  -- Multivitamin 2 tabs daily (patient already taking multivitamin)    Phone call was made to arrange steroid and also diphenhydramine push before administering Rituxan, unlikely to be anaphylactic in nature based on history. Patient interested in pursuing rituxan infusion within the infusion center.    # Oral thrush  - clotrimazole 10mg  lorzanges 6  times a day PRN       # Acanthosis nigricans  - Discussed benign etiology and common relationship with hyperglycemia / diabetes. Patient recommended to get A1c checked at next PCP visit.  - CONTINUE hydrocortisone 2.5% cream BID to AA, discussed side effects of topical steroid use including skin thinning, telangiectasia, and atrophy if used continuously for long term    Patient given the opportunity to ask questions.  Side effects of medications have been discussed.  Sun precautions discussed and emphasized with the patient.    # dermatitis of foot, possible fungal involvement  - lotrisone under occlusive seran wrap    Return to Clinic in 5 weeks or sooner prn.        The patient was seen and examined with the attending physician below, who agrees with the above assessment and plan.    Suzy Bouchard M.D., M.B.A.   Resident Physician, PGY-2  [employer]    I have seen, evaluated, and performed a history and physical as well as formulated an assessment and plan with the the resident and I agree with the assessment and plan above.     Chuck Hint, MD, PhD

## 2019-03-18 ENCOUNTER — Encounter: Payer: Self-pay | Admitting: Dermatology

## 2019-03-18 ENCOUNTER — Telehealth: Payer: Self-pay | Admitting: Dermatology

## 2019-03-18 DIAGNOSIS — L102 Pemphigus foliaceous: Secondary | ICD-10-CM

## 2019-03-18 DIAGNOSIS — L100 Pemphigus vulgaris: Secondary | ICD-10-CM

## 2019-03-18 HISTORY — DX: Pemphigus vulgaris (CMS-HCC): L10.0

## 2019-03-18 HISTORY — DX: Pemphigus foliaceous (CMS-HCC): L10.2

## 2019-03-18 NOTE — Telephone Encounter (Signed)
Dr Charm Barges had me faxed over new orders for Rituxan per [facility] protocol.  Nicole Kindred called and confirmed he has received and will submit for auth.  Patient getting IVIG next week so ideally we will start Rituxan (at an infusion center for one dose) the next week.  Nicole Kindred will update me on auth.

## 2019-03-18 NOTE — Telephone Encounter (Signed)
Printed and gave to [employer]

## 2019-03-18 NOTE — Telephone Encounter (Signed)
Caller rewueting to speak to [facility]. Nicole Kindred states that he faxed an order for MD signature for verbal orders. Also, the Rituxan has been approved through March 1st 2021. Patient will receive her forst dose this month. Caller requesting orders to be sent to him. If you have any questions please call. Thank you.

## 2019-03-18 NOTE — Telephone Encounter (Signed)
Spoke with Nicole Kindred.  I will call about getting infusion center arranged.  Nicole Kindred sending over fax of full orders and clarifications for Dr Charm Barges to sign Monday.

## 2019-03-18 NOTE — Telephone Encounter (Signed)
Nicole Kindred is calling to speak with Ria Comment, transferred to office

## 2019-03-21 NOTE — Telephone Encounter (Signed)
Spoke with patient.  Gave her the [place] infusion center scheduling number.  She knows to try and schedule about 03/28/2019 but will let me know the date.  Anita Turner was wondering about orders for IV push benadryl.  I confirmed she has orders for that both here at the infusion center and in her home infusion orders now, assuming she tolerates the infusion center and can switch back to home infusion.

## 2019-03-24 ENCOUNTER — Telehealth: Payer: Self-pay | Admitting: Dermatology

## 2019-03-24 NOTE — Telephone Encounter (Signed)
[facility] is calling to speak with nurse about reaction to medication. transferred to clinic

## 2019-03-24 NOTE — Telephone Encounter (Signed)
Anita Turner from infusion pharmacy.  Lymph node behind her ear enlarged and hardened. They stopped the infusion and the lymph node went back to normal.  They resumed and it enlarged and hardened again.  Anita Turner advised they should decrease rate.  I texted Dr Charm Barges as he is off site and is ok with a decrease rate and aware of the reaction.

## 2019-03-25 ENCOUNTER — Telehealth: Payer: Self-pay | Admitting: Dermatology

## 2019-03-25 NOTE — Telephone Encounter (Signed)
Nicole Kindred form Infusion Pharmacy called to speak to RN Dani Gobble took over call.

## 2019-03-25 NOTE — Telephone Encounter (Signed)
Spoke to [place], he spoke to Dr Charm Barges yesterday regarding reaction to infusion, pt formed nodule behind right ear during treatment. Nodule went away when infusion stopped and appeared again when infusion resumed. Per Dr Charm Barges to Nicole Kindred, ok to resume infusion#4.    Nicole Kindred reporting infusion #5 today, patient reporting symptoms of SOB and facial swelling halfway through infusion. Spoke to Dr Charm Barges and he called Nicole Kindred to inform pt will discontinue all infusions at this time and follow up in office on Monday.    Called patient and scheduled for Monday

## 2019-03-27 NOTE — Progress Notes (Addendum)
Chuck Hint, MD PhD  Professor of Dermatology  [place], [place], [place] [address]  [address], [place] [id number]  [email or web address]  Phone: [phone] Fax: [phone]  [facility] Department of Dermatology    Date of Service:  02/07/19    CHIEF COMPLAINT:   No chief complaint on file.       HPI:   Anita Turner is a 48 year old year old female who presents today for pemphigus vulgaris f/u. Recently restarted on systemic therapy after flare (while off maintenance therapy). Currently on prednisone taper (70 mg until 04/05/2019, then plan 60 mg), monthly IVIG (Carbafusion, started 03/2019), rituxan (03/2019), doxy, NCM, Orlovista, MV. LV 03/17/2019. Here for f/u for concern for reactions.    - Today, reports no oral/skin lesions  - Last week, had IVIG (Carbafusion) with some itchy throat and itchy ears, and some transient shortness of breath that resolved 5 mins later once stopped infusion.  - Currently taking steroid shot (40 mg) and benadryl (50 mg), Tylenol (325 mg x2) before infusion this past week.   - In the past, had rituxan and slowed rate and IV benadryl which helped.      Per prior note:  Pt notes she was diagnosed with pemphigus vulgaris and pemphigus foliacious. Has been off prednisone in June 2019. Off of Cellcept in March. Recently saw a physician in [facility] and was recommended Rituxan. Took one dose and was put on hold because of COVID. Pt notes that with induction of rituxan became very itchy. Was not alleviated with benadryl. Saw Dr. Trevor Mace in [facility] (Dr. Janace Litten previous resident).     Developed oral lesions ~early July 2020. Initially tongue involvement and then started involving gums and lips. Used magic mouthwash, TAC dental paste for flares. Prior flares would only last 3-4 days, but this time it's longer.     Recently  moved from [place], here to establish care    ROS:   Review of Systems:  Constitutional: Negative for fever and weight  loss  Cardiovascular: Negative for chest pain  Respiratory: Negative for shortness of breath  GU: Negative for dysuria and hematuria  Musculoskeletal: Negative for arthritis and muscle pain.  Neuro: Negative for dizziness, seizures  Psych: The patient denies depression and anxiety  Endo: Negative  Heme: Negative for easy bruising and bleeding disorders  Eyes: Negative for eye pain and change of vision  ENT: Negative for hoarseness and sore throat      Past Medical:   has no past medical history on file.    Social History:   has no history on file for tobacco, alcohol, and drug.    Family History:  family history is not on file.    Home Medications:  has a current medication list which includes the following prescription(s): clotrimazole, clotrimazole-betamethasone, doxycycline, hydrocortisone, maalox-lidocaine viscous-diphenhydramine 1:1:1 ratio, multiple vitamins-minerals, mycophenolate mofetil, niacinamide, and prednisone.    Allergies:   Patient has no known allergies.      PE:  GENERAL: Well developed, well nourished, in no acute distress. A/O x 3.    Skin Exam: Skin type: IV  Full body skin exam performed including scalp, face, conjunctiva, lips, oral mucosa, neck, chest, back, abdomen, right upper, left upper, right lower, left lower extremity, hands, nails, buttocks, genitals, and the following was noted:    Pertinent Skin Findings:   - no oral lesions today  ?  Thyroid - No thyromegaly  or nodularity grossly today    Labs/Imaging:   02/07/2019    CMP wnl   CBC notable for mild leukocytosis 11.4 (stable since 09/2017 per CareEverywhere review)   IgA 203 (wnl)    02/07/2019   Basement Membrane Zone (BMZ) IgG, IgG4, and IgA  IgG: Negative, monkey esophagus substrate     Negative, human split skin substrate      IgG4: Negative, monkey esophagus substrate        Negative, human split skin substrate   IgA: Negative, monkey esophagus substrate     Negative, human split skin substrate   Positive IgG,  including IgG4, cell surface antibodies,         monkey esophagus substrate     Enzyme Linked Immunosorbent Assay (ELISA)   --------------------------------------------------   Bullous Pemphigoid (BP) 180 and 230 IgG Antibodies   IgG BP 180 antibodies: 1 unit  NEGATIVE  IgG BP 230 antibodies: 1 unit  NEGATIVE    02/07/2019  Cell Surface IgG Antibodies   IgG: Positive, titer 1:2560 (H), monkey esophagus substrate     Positive, titer 1:640 (H), intact human skin substrate     Enzyme-Linked Immunosorbent Assay (ELISA)  -----------------------------------------  Desmoglein (DSG) 1 and 3 IgG Antibodies   IgG desmoglein 1 antibodies:  7 units  NEGATIVE    Reference Range:      Positive (H) = Greater than 20 units      Borderline/Indeterminate = 14-20 units      Negative = Less than 14 units   IgG desmoglein 3 antibodies: 320 units (H)  POSITIVE     (Initial level, 139 units, greater than high         calibrator; diluted to achieve 64 units, within      assay calibrators, and multiplied by the dilution      factor of 5)   COMMENTS   Specific   -------------------------------------------------   These indirect immunofluorescence results, demonstrating   positive IgG cell surface antibody reactivity on both   monkey esophagus and intact human skin substrates, support   the diagnosis of pemphigus.      Assessment & Plan:     # Pemphigus Vulgaris  Diagnosed previously by outside dermatologist as pemphigus vulgaris and pemphigus foliaceus. Serum antibody titers on 02/07/2019 at [facility] c/w pemphigus vulgaris per above. (anti-Desmoglein 3 ab positive) Previously treated with prednisone (was on 60mg  daily with slow taper, off since June 2019), Cellcept (off since March 2020). Previously followed with Dr. Rebecka Apley in [place]. Received one dose of Rituxan (09/17/18) and put on hold due to [place].   - Developed recurrence of erosions in oral mucosa, resolved after starting prednisone,  however with very small region in intergluteal cleft.    Labs: last labs 02/07/2019 reviewed. Patient may need to get hepatitis labs checked prior to starting infusions    Tx:  - Rituxan weekly for Weeks 1, 2, and 3 and then IVIG on Week 4. (Started 03/2019)  -- IVIG monthly (Started 03/2019)  -- Called Kabafusion, discussed - Benadryl push prior to IVIG and Xanax 2 hours before. Limit rate ~75.  -- Continue prednisone 70mg  qAM until 9/1, with plans to taper to 60 mg at next visit if no new lesions  -- Stopped CellCept 1000mg  BID once started Rituxan  -- Doxycycline 100mg  BID  -- Niacinamide 500mg  TID  -- Calcium 1g supplement daily (can be acquired over the counter)  -- Multivitamin 2 tabs daily (patient already taking multivitamin)    #  Oral thrush  - clotrimazole 10mg  lorzanges 6 times a day PRN     # Acanthosis nigricans  - Discussed benign etiology and common relationship with hyperglycemia / diabetes. Patient recommended to get A1c checked at next PCP visit.  - CONTINUE hydrocortisone 2.5% cream BID to AA, discussed side effects of topical steroid use including skin thinning, telangiectasia, and atrophy if used continuously for long term    Patient given the opportunity to ask questions.  Side effects of medications have been discussed.  Sun precautions discussed and emphasized with the patient.    # dermatitis of foot, possible fungal involvement  - lotrisone under occlusive seran wrap    Return to Clinic on 9/1 as previoulsy scheduled    The patient was seen and examined with the attending physician below, who agrees with the above assessment and plan.    Resident: Claretta Fraise, MD  Attending: Chuck Hint, MD, PhD    I have seen, evaluated, and performed a history and physical as well as formulated an assessment and plan with the the resident and I agree with the assessment and plan above.     Chuck Hint, MD, PhD

## 2019-03-28 ENCOUNTER — Ambulatory Visit: Payer: PPO | Admitting: Dermatology

## 2019-03-28 ENCOUNTER — Telehealth: Payer: Self-pay | Admitting: Dermatology

## 2019-03-28 VITALS — BP 120/69 | HR 99

## 2019-03-28 DIAGNOSIS — L100 Pemphigus vulgaris: Secondary | ICD-10-CM

## 2019-03-28 DIAGNOSIS — Z79899 Other long term (current) drug therapy: Secondary | ICD-10-CM

## 2019-03-28 MED ORDER — ALPRAZOLAM 0.25 MG OR TABS
0.2500 mg | ORAL_TABLET | ORAL | 3 refills | Status: DC | PRN
Start: 2019-03-28 — End: 2019-06-20

## 2019-03-28 MED ORDER — DIPHENHYDRAMINE HCL 50 MG/ML IJ SOLN
INTRAMUSCULAR | 8 refills | Status: DC
Start: 2019-03-28 — End: 2021-03-27

## 2019-03-28 NOTE — Telephone Encounter (Signed)
Called patient and told her that the benadryl injection order was for her infusion center and she does not need to pick it up. Told patient that she will stop her cellcept the first day of rituxan infusion.     Patient also wanted to ask Dr. Charm Barges about random swelling of the face, however it resolves with benadryl. Discussed this with Dr. Charm Barges and he said that it is okay and not dangerous. Told patient this but if she notices any worsening to call the emergency line. Patient understands.

## 2019-03-28 NOTE — Telephone Encounter (Signed)
Patient states Benadryl injection is not carried by [facility]. Patient would like to ask  If patient needs to continue Cellcept 500mg . Please call.

## 2019-03-28 NOTE — Patient Instructions (Signed)
You may take Xanax 2 hours

## 2019-03-29 NOTE — Addendum Note (Signed)
Addended by: Frutoso Chase RENEE on: 03/29/2019 08:32 AM     Modules accepted: Orders

## 2019-03-30 ENCOUNTER — Ambulatory Visit: Payer: PPO

## 2019-03-30 ENCOUNTER — Ambulatory Visit: Payer: PPO | Attending: Dermatology

## 2019-03-30 VITALS — BP 130/86 | HR 83 | Temp 97.7°F | Resp 16

## 2019-03-30 DIAGNOSIS — L100 Pemphigus vulgaris: Secondary | ICD-10-CM | POA: Insufficient documentation

## 2019-03-30 DIAGNOSIS — L102 Pemphigus foliaceous: Secondary | ICD-10-CM | POA: Insufficient documentation

## 2019-03-30 MED ORDER — DIPHENHYDRAMINE HCL 50 MG/ML IJ SOLN
50.0000 mg | Freq: Once | INTRAMUSCULAR | Status: AC
Start: 2019-03-30 — End: 2019-03-30
  Administered 2019-03-30: 50 mg via INTRAVENOUS
  Filled 2019-03-30: qty 1

## 2019-03-30 MED ORDER — ACETAMINOPHEN 325 MG PO TABS
650.0000 mg | ORAL_TABLET | Freq: Once | ORAL | Status: AC
Start: 2019-03-30 — End: 2019-03-30
  Administered 2019-03-30: 09:00:00 650 mg via ORAL
  Filled 2019-03-30: qty 2

## 2019-03-30 MED ORDER — ACETAMINOPHEN 325 MG PO TABS
ORAL_TABLET | ORAL | Status: AC
Start: 2019-03-30 — End: 2019-03-30
  Filled 2019-03-30: qty 2

## 2019-03-30 MED ORDER — SODIUM CHLORIDE 0.9 % IV SOLN
Freq: Once | INTRAVENOUS | Status: AC
Start: 2019-03-30 — End: 2019-03-30
  Administered 2019-03-30: 10:00:00 via INTRAVENOUS
  Filled 2019-03-30: qty 250

## 2019-03-30 MED ORDER — METHYLPREDNISOLONE SODIUM SUCC 125 MG IJ SOLR CUSTOM
125.0000 mg | Freq: Once | INTRAMUSCULAR | Status: AC
Start: 2019-03-30 — End: 2019-03-30
  Administered 2019-03-30 (×2): 125 mg via INTRAVENOUS
  Filled 2019-03-30: qty 125

## 2019-03-30 MED ORDER — SODIUM CHLORIDE 0.9 % IV SOLN
375.0000 mg/m2 | Freq: Once | INTRAVENOUS | Status: AC
Start: 2019-03-30 — End: 2019-03-30
  Administered 2019-03-30 (×2): 700 mg via INTRAVENOUS
  Filled 2019-03-30: qty 50

## 2019-03-30 MED ORDER — METHYLPREDNISOLONE SODIUM SUCC 125 MG IJ SOLR CUSTOM
INTRAMUSCULAR | Status: AC
Start: 2019-03-30 — End: 2019-03-30
  Filled 2019-03-30: qty 125

## 2019-03-30 MED ORDER — DIPHENHYDRAMINE HCL 50 MG/ML IJ SOLN
INTRAMUSCULAR | Status: AC
Start: 2019-03-30 — End: 2019-03-30
  Filled 2019-03-30: qty 1

## 2019-03-30 NOTE — Interdisciplinary (Signed)
0920:  Pt A&Ox4 ambulatory into [facility].  Here for Rituxan infusion.  Pt states she had a Rituxan infusion last year and had a reaction with throat itching.  Tried it a 2nd time and had similar reaction 20 minutes into infusion and infusion was discontinued.  Discussed plan of care with pt, all questions answered.  Pt premedicated per orders, waited 35 minutes then infusion started.    1425:  Infusion complete, pt tolerated well.  No complaints or adverse effects.  Quintella Reichert, RN reviewed AVS/discharge instructions with patient. Acknowledged understanding.  VSS, IV dc'd intact.  Ambulated to exit no distress.  Pt's husband picked pt up.    Learning Needs with Barriers to Learning    Has the patient identified a caregiver for post-discharge? NO  Caregiver Name:N/A  Barriers to Learning: None  Will there be a co-learner? No  Co-Learner Name: N/A  Primary Language: English  Co-Learner Primary Language: N/A  Interpreter Required: No  Preferred Learning Methods: Visual  Are there any special topics the patient would like to review? Rituxa side effects, infusion reaction  Answered by (relationship): Patient

## 2019-04-01 ENCOUNTER — Ambulatory Visit: Payer: PPO

## 2019-04-01 ENCOUNTER — Telehealth: Payer: Self-pay | Admitting: Dermatology

## 2019-04-01 NOTE — Telephone Encounter (Signed)
Patient called.  She would like to speak to the Nurse.  Patient has Facial Swelling.  She states if comes and goes.  She is taking Benadryl.  Thank you .

## 2019-04-01 NOTE — Telephone Encounter (Signed)
Dr. Charm Barges called me back and I went over the patient's symptoms with him and told him I advised her to go to the emergency room. Dr. Charm Barges agreed. Called patient and told her that Dr. Charm Barges agrees with her going to the ER. Patient understands, however now symptoms are improving but she will go to the ER if symptoms worsen again. Told her to keep [place] updated.

## 2019-04-01 NOTE — Telephone Encounter (Signed)
Patient is calling back stating now the swelling is spreading to the upper body. Patient denies any pain, trouble breathing, trouble with her vision but she has discomfort and muscle cramps. Now that the symptoms have worsen, I suggested patient to go the emergency room. In the mean time, I called Dr. Charm Barges and left him a message and also sent him an email. Will wait for his reply and let patient know.

## 2019-04-01 NOTE — Telephone Encounter (Signed)
Called patient back. Patient's facial swelling from her medication  is the same from Monday when I talked to her and that benadryl is helping with the swelling. Swelling is mild. Monday we addressed it with Dr. Charm Barges and per Dr. Charm Barges she can continue to take the benadryl if it is helping. Told her if she notices the swelling getting worse or affects breathing or vision to go to the ER. Patient has a follow up with Dr. Charm Barges on Tuesday, and will address it again then.

## 2019-04-05 ENCOUNTER — Encounter: Payer: Self-pay | Admitting: Dermatology

## 2019-04-05 ENCOUNTER — Ambulatory Visit: Payer: PPO | Admitting: Dermatology

## 2019-04-05 VITALS — BP 142/80 | HR 96

## 2019-04-05 DIAGNOSIS — A6009 Herpesviral infection of other urogenital tract: Secondary | ICD-10-CM

## 2019-04-05 DIAGNOSIS — Z79899 Other long term (current) drug therapy: Secondary | ICD-10-CM

## 2019-04-05 DIAGNOSIS — L100 Pemphigus vulgaris: Secondary | ICD-10-CM

## 2019-04-05 MED ORDER — VALACYCLOVIR HCL 500 MG OR TABS
500.0000 mg | ORAL_TABLET | Freq: Two times a day (BID) | ORAL | 0 refills | Status: DC
Start: 2019-04-05 — End: 2019-05-26

## 2019-04-05 NOTE — Progress Notes (Addendum)
Chuck Hint, MD PhD  Professor of Dermatology  [place], [place], [place] [address]  [address], [place] [id number]  [email or web address]  Phone: [phone] Fax: [phone]  [facility] Department of Dermatology    Date of Service:  04/05/2019      CHIEF COMPLAINT:   No chief complaint on file.       HPI:   Anita Turner is a 48 year old year old female who presents today for pemphigus vulgaris f/u.       Recently restarted on systemic therapy after flare (while off maintenance therapy). Currently on prednisone taper (70 mg until 04/05/2019, then plan 60 mg), monthly IVIG (Carbafusion, started 03/2019), rituxan (03/2019), doxy, NCM, [place], MV. LV 03/28/2019. Here for f/u.    - Today, reports no new oral/skin lesions  - Patient has tender areas nears the perineum that she would like evaluated.   - She continues IVIG (Carbafusion)   - Currently taking steroid shot (40 mg) and benadryl (50 mg), Tylenol (325 mg x2) before infusion this past week, xanax 1/2 tablet of 0.25 mg.   - In the past, had rituxan and slowed rate and IV benadryl which helped.       Per prior note:  Pt notes she was diagnosed with pemphigus vulgaris and pemphigus foliacious. Has been off prednisone in June 2019. Off of Cellcept in March. Recently saw a physician in [facility] and was recommended Rituxan. Took one dose and was put on hold because of COVID. Pt notes that with induction of rituxan became very itchy. Was not alleviated with benadryl. Saw Dr. Trevor Mace in [facility] (Dr. Janace Litten previous resident).     Developed oral lesions ~early July 2020. Initially tongue involvement and then started involving gums and lips. Used magic mouthwash, TAC dental paste for flares. Prior flares would only last 3-4 days, but this time it's longer.     Recently  moved from [place], here to establish care    ROS:   Review of Systems:  Constitutional: Negative for fever and weight loss  Cardiovascular: Negative for chest  pain  Respiratory: Negative for shortness of breath  GU: Negative for dysuria and hematuria  Musculoskeletal: Negative for arthritis and muscle pain.  Neuro: Negative for dizziness, seizures  Psych: The patient denies depression and anxiety  Endo: Negative  Heme: Negative for easy bruising and bleeding disorders  Eyes: Negative for eye pain and change of vision  ENT: Negative for hoarseness and sore throat      Past Medical:   has no past medical history on file.    Social History:   has no history on file for tobacco, alcohol, and drug.    Family History:  family history is not on file.    Home Medications:  has a current medication list which includes the following prescription(s): alprazolam, clotrimazole, clotrimazole-betamethasone, diphenhydramine, doxycycline, hydrocortisone, maalox-lidocaine viscous-diphenhydramine 1:1:1 ratio, multiple vitamins-minerals, mycophenolate mofetil, niacinamide, and prednisone.    Allergies:   Patient has no known allergies.      PE:  GENERAL: Well developed, well nourished, in no acute distress. A/O x 3.    Skin Exam: Skin type: IV  Full body skin exam performed including scalp, face, conjunctiva, lips, oral mucosa, neck, chest, back, abdomen, right upper, left upper, right lower, left lower extremity, hands, nails, buttocks, genitals, and the following was noted:    Pertinent Skin Findings:   - no oral lesions  today  - grouped punched out erythematous erosions in the perineum   - few white patches in the oropharynx  ?  Thyroid - No thyromegaly or nodularity grossly today    Labs/Imaging:   02/07/2019    CMP wnl   CBC notable for mild leukocytosis 11.4 (stable since 09/2017 per [facility] review)   IgA 203 (wnl)    02/07/2019   Basement Membrane Zone (BMZ) IgG, IgG4, and IgA  IgG: Negative, monkey esophagus substrate     Negative, human split skin substrate      IgG4: Negative, monkey esophagus substrate        Negative, human split skin substrate   IgA: Negative,  monkey esophagus substrate     Negative, human split skin substrate   Positive IgG, including IgG4, cell surface antibodies,         monkey esophagus substrate     Enzyme Linked Immunosorbent Assay (ELISA)   --------------------------------------------------   Bullous Pemphigoid (BP) 180 and 230 IgG Antibodies   IgG BP 180 antibodies: 1 unit  NEGATIVE  IgG BP 230 antibodies: 1 unit  NEGATIVE    02/07/2019  Cell Surface IgG Antibodies   IgG: Positive, titer 1:2560 (H), monkey esophagus substrate     Positive, titer 1:640 (H), intact human skin substrate     Enzyme-Linked Immunosorbent Assay (ELISA)  -----------------------------------------  Desmoglein (DSG) 1 and 3 IgG Antibodies   IgG desmoglein 1 antibodies:  7 units  NEGATIVE    Reference Range:      Positive (H) = Greater than 20 units      Borderline/Indeterminate = 14-20 units      Negative = Less than 14 units   IgG desmoglein 3 antibodies: 320 units (H)  POSITIVE     (Initial level, 139 units, greater than high         calibrator; diluted to achieve 64 units, within      assay calibrators, and multiplied by the dilution      factor of 5)   COMMENTS   Specific   -------------------------------------------------   These indirect immunofluorescence results, demonstrating   positive IgG cell surface antibody reactivity on both   monkey esophagus and intact human skin substrates, support   the diagnosis of pemphigus.      Assessment & Plan:     # Pemphigus Vulgaris, recent recurrence of oral erosions 02/2019, now improving  Diagnosed previously by outside dermatologist as pemphigus vulgaris and pemphigus foliaceus. Serum antibody titers on 02/07/2019 at [facility] c/w pemphigus vulgaris per above. (anti-Desmoglein 3 ab positive) Previously treated with prednisone (was on 60mg  daily with slow taper, off since June 2019), Cellcept (off since March 2020). Previously followed with Dr. Rebecka Apley in [place]. Received one  dose of Rituxan (09/17/18) and put on hold due to [place].   Labs: last labs 02/07/2019 reviewed.  Tx:  - Rituxan weekly for Weeks 1, 2, and 3 and then IVIG on Week 4. (Started 03/2019)  -- IVIG monthly (Started 03/2019)  -- Called Kabafusion, discussed  -- Benadryl push prior to IVIG and Xanax 2 hours before. Limit rate ~75.  -- Taper prednisone 60mg  qAM x 3 weeks, then taper to 50 mg x 3 weeks   -- Stopped CellCept 1000mg  BID once started Rituxan 03/2019  -- Doxycycline 100mg  BID  -- Niacinamide 500mg  TID  -- Calcium 1g supplement daily (can be acquired over the counter)  -- Multivitamin 2 tabs daily (patient already taking multivitamin)    # Herpes genitalis  based on morphology of group punched out erosions in the perineum  - START valacyclovir 500 mg BID x 10 days     # Oral thrush  - clotrimazole 10mg  lorzanges 6 times a day PRN     # Acanthosis nigricans  - CONTINUE hydrocortisone 2.5% cream BID to AA, discussed side effects of topical steroid use including skin thinning, telangiectasia, and atrophy if used continuously for long term    Patient given the opportunity to ask questions.  Side effects of medications have been discussed.  Sun precautions discussed and emphasized with the patient.      Return to Clinic 6 weeks or sooner prn     The patient was seen and examined with Dr. Charm Barges, who agrees with the above assessment and plan.    Resident: Domenic Schwab, MD   PGY-4 [facility]     Attending: Chuck Hint, MD, PhD    I have seen, evaluated, and performed a history and physical as well as formulated an assessment and plan with the the resident and I agree with the assessment and plan above.     Chuck Hint, MD, PhD

## 2019-04-05 NOTE — Patient Instructions (Addendum)
Please decrease your prednisone dose to 60 mg daily for three weeks.    After three weeks, please take 50 mg daily for 3 three weeks.     Return to clinic in 6 weeks.     Please take valacyclovir 500 mg twice a day for 10 days for the buttocks lesions.

## 2019-04-06 ENCOUNTER — Ambulatory Visit: Payer: PPO

## 2019-04-07 ENCOUNTER — Telehealth: Payer: Self-pay | Admitting: Dermatology

## 2019-04-07 NOTE — Telephone Encounter (Signed)
Nicole Kindred was calling to clarify that patient is only having Rituxan last week and this week (versus the week 1, 2, 3 protocol).  There was also a dose discrepancy that Dr Charm Barges clarified directly over the phone with Nicole Kindred.  The dose is per protocol 375/m2; Dr Charm Barges stated that there were only two doses needed as patient had an additional dose in July, under her other insurance authorization.    Nothing further at this time.

## 2019-04-07 NOTE — Telephone Encounter (Signed)
Pharmacy calling back to speak with [facility]. transferred  to clinic

## 2019-04-13 ENCOUNTER — Ambulatory Visit: Payer: PPO

## 2019-04-22 ENCOUNTER — Telehealth: Payer: Self-pay | Admitting: Dermatology

## 2019-04-22 NOTE — Telephone Encounter (Signed)
Anita Turner reports new tooth pain which she believes is related to prednisone.  She wants to taper faster if possible.  I told her will I ask Dr Charm Barges.    Anita Turner also wants to move her Rituxan from Friday October 2nd to Sept 30th; she wants to know if that is ok.

## 2019-04-22 NOTE — Telephone Encounter (Addendum)
Patient would like to speak with doctors nurse regarding she is having issues with her mouth which she states non urgent but she wants an advice. . Please assist. Thank you.

## 2019-04-25 NOTE — Telephone Encounter (Signed)
Called patient back to let her know that Dr Charm Barges said he will discuss taper with her at next visit and that moving the rituxan date is fine.    Anita Turner asked me to check what labs she is due for.  I told her since she is off cellcept there are no routine labs needed from what I can see.  She asked me to check the last note.  I did and reported back there are no labs mentioned.  She told me she was sure she needed some; I said we could discuss and arrange at her next visit.  She asked me to check with Dr Charm Barges.  I told her I will reach out.

## 2019-04-25 NOTE — Telephone Encounter (Signed)
Dr Charm Barges verified no lab work was needed.  Called patient back and let her know.

## 2019-05-05 ENCOUNTER — Ambulatory Visit: Payer: PPO | Attending: Internal Medicine | Admitting: Internal Medicine

## 2019-05-05 ENCOUNTER — Encounter: Payer: Self-pay | Admitting: Internal Medicine

## 2019-05-05 DIAGNOSIS — Z23 Encounter for immunization: Secondary | ICD-10-CM | POA: Insufficient documentation

## 2019-05-05 DIAGNOSIS — L100 Pemphigus vulgaris: Secondary | ICD-10-CM | POA: Insufficient documentation

## 2019-05-05 DIAGNOSIS — Z6838 Body mass index (BMI) 38.0-38.9, adult: Secondary | ICD-10-CM | POA: Insufficient documentation

## 2019-05-05 DIAGNOSIS — N200 Calculus of kidney: Secondary | ICD-10-CM | POA: Insufficient documentation

## 2019-05-05 DIAGNOSIS — L102 Pemphigus foliaceous: Secondary | ICD-10-CM | POA: Insufficient documentation

## 2019-05-05 DIAGNOSIS — R7303 Prediabetes: Secondary | ICD-10-CM | POA: Insufficient documentation

## 2019-05-05 DIAGNOSIS — E538 Deficiency of other specified B group vitamins: Secondary | ICD-10-CM

## 2019-05-05 DIAGNOSIS — E6609 Other obesity due to excess calories: Secondary | ICD-10-CM | POA: Insufficient documentation

## 2019-05-05 DIAGNOSIS — Z6841 Body Mass Index (BMI) 40.0 and over, adult: Secondary | ICD-10-CM | POA: Insufficient documentation

## 2019-05-05 MED ORDER — MELATONIN 3 MG OR CAPS
3.00 mg | ORAL_CAPSULE | Freq: Every evening | ORAL | Status: AC
Start: 2019-05-05 — End: ?

## 2019-05-05 MED ORDER — PREDNISONE 20 MG OR TABS
ORAL_TABLET | ORAL | Status: DC
Start: 2019-05-05 — End: 2020-04-28

## 2019-05-05 NOTE — Progress Notes (Addendum)
45 y. Anita Turner. [place] female with hx pemphigus vulgaris who has moved from [place] to [place] and has been seeing Dermatology and at this point is receiving infusion of Rituxan and IVIG and has been on the tapering dose of very large dose of prednisone here to get established for her primary care.  Has had sensitivity of her teeth when she drinks the water at room temperature, has not had a dental exam for few months and she used to go every 6 months.  PROBLEM LIST:   Patient Active Problem List   Diagnosis   . Pemphigus vulgaris   . Pemphigus foliaceus   . Strain of muscle of right hip   . Abnormal EKG   . Chondromalacia of right patella   . Obesity with body mass index 30 or greater   . Class 2 obesity due to excess calories without serious comorbidity with body mass index (BMI) of 38.0 to 38.9 in adult   . Disorder of vitamin B12   . Ingrowing toenail   . Osteoarthritis of knee   . Pain of right thumb   . Patellofemoral stress syndrome   . Prediabetes   . Foot pain   . Shortness of breath   . Synovitis     SURGICAL HISTORY:  Past Surgical History:   Procedure Laterality Date   . CHOLECYSTECTOMY  2017   . APPENDECTOMY  2002     FAMILY HISTORY:  Family History   Problem Relation Name Age of Onset   . Diabetes Mother          HTN, DJD   . Neurological Disease (Parkinsons/Huntingtons/Huntingtons Chorea) Father          DM, alcoholism   . No Known Problems Sister     . Thyroid Brother       SOCIAL HISTORY: reports that she has never smoked. She has never used smokeless tobacco. She reports previous alcohol use. She reports that she does not use drugs.  Exercise:  Walks about 3 miles every day  Work:  Housewife  MEDICATIONS: ,    Outpatient Medications Marked as Taking for the 05/05/19 encounter (Office Visit) with Cherene Altes, MD   Medication Sig Dispense Refill   . ALPRAZolam (XANAX) 0.25 MG tablet Take 1 tablet (0.25 mg) by mouth as needed for Anxiety (2 hours prior to IV infusion). 10 tablet 3   .  diphenhydrAMINE (BENADRYL) 50 MG/ML injection Please give 50 mg IV push prior to Rituxan infusions.  If needed may give a repeat 25 mg IV push dose. 1 vial 8   . Multiple Vitamins-Minerals (MULTIVITAMIN ADULT PO) multivitamin   daily     . Niacinamide 500 MG TBCR Take 500 mg by mouth 3 times daily. 90 tablet 11   . predniSONE (DELTASONE) 20 MG tablet 2 and a half tablet a day now       ALLERGIES: No Known Allergies       Review of Systems:   Skin:see HPI  HEENT:gets headaches every 10 days may be related to infusion, takes Tylenol or Ibuprofen  Neck:no pain  Respiratory:no shortness   Cardiovascular:no chest pain  Gastrointestinal:a lot of gas  Genitourinary:no dysuria  Neurological:no numbness  Musculoskeletal:gets cramps in her left hand and left leg, has right knee pain when she is on tapering dose of the Prednisone  Psychiatric:feels worried and anxious at times, takes Xanax as needed  Endocrine: had prediabetes and it was resolved with diet  PHYSICAL EXAM:  BP 106/78 (BP Location: Left arm, BP Patient Position: Sitting, BP cuff size: Regular)   Pulse 100   Temp 97.9 F (36.6 C) (Temporal)   Resp 20   Ht 5' (1.524 m)   Wt 93.4 kg (205 lb 14.6 oz)   LMP  (Within Weeks)   BMI 40.21 kg/m     HEENT: nl fundi, nl TM, nl oral/nasal mucosa, PERRLA  Neck: no abnl JVD, no bruit, no thyromegaly  Nodes: no LAD  Pulmonary: nl chest expansion, CTAB, no rales or wheezes  Cardiovascular: Regular rate and rhythm, nl S1 & S2, no murmur or gallop or rub, 2/4 radial, dorsalis pedis pulses  Abdominal: + BS in all 4 quadrants, non tender to palpation, non distended, nl tympany, hard to assess hepatosplenomegaly or mass due to obesity, no suprapubic tenderness  Back-Extremities: no spinal tenderness, no CVA tenderness, no edema or cyanosis, NROM of all joints except some crepitance of the right knee  Skin: warm and dry, no susp skin lesions  Neuro: CN II- XII WNL, EOMI, strength 5/5 U and L extrem, DTR's 2/4 in  biceps, brachioradialis and patellar area, nl sensation to filament/ vib, no pronator drift        Results for Anita Turner, Anita Turner (MRN [id number]) as of 05/05/2019 10:42   Ref. Range 02/07/2019 16:39   Sodium Latest Ref Range: 136 - 145 mmol/L 138   Potassium Latest Ref Range: 3.5 - 5.1 mmol/L 4.2   Chloride Latest Ref Range: 98 - 107 mmol/L 104   CO2 Latest Ref Range: 21 - 31 mmol/L 29   Anion Gap Latest Ref Range: 2 - 12 mmol/L 5   BUN Latest Ref Range: 7 - 25 mg/dL 7   Creatinine Latest Ref Range: 0.6 - 1.2 mg/dL 0.6   GFR Latest Ref Range: >59  >60   GFR African American Latest Ref Range: >59  >60   Glucose Latest Ref Range: 70 - 115 mg/dL 97   Calcium Latest Ref Range: 8.6 - 10.3 mg/dL 9.2   Alkaline Phos Latest Ref Range: 34 - 104 U/L 98   ALT (SGPT) Latest Ref Range: 7 - 52 U/L 17   AST (SGOT) Latest Ref Range: 13 - 39 U/L 17   Bilirubin, Tot Latest Ref Range: 0.0 - 1.4 mg/dL 0.3   Albumin Latest Ref Range: 3.7 - 5.3 G/DL 4.0   Total Protein Latest Ref Range: 6.0 - 8.3 G/DL 6.8   IGA Latest Ref Range: 84 - 499 mg/dL 203   WBC Latest Ref Range: 4.0 - 10.5 THOUS/MCL 11.4 (H)   RBC Latest Ref Range: 3.70 - 5.00 MILL/MCL 4.68   Hgb Latest Ref Range: 11.5 - 15.0 G/DL 13.4   Hct Latest Ref Range: 34.0 - 44.0 % 38.9   MCV Latest Ref Range: 81.5 - 97.0 FL 83.2   MCH Latest Ref Range: 27.0 - 33.5 PG 28.7   MCHC Latest Ref Range: 32.0 - 35.5 G/DL 34.5   RDW-CV Latest Ref Range: 11.6 - 14.4 % 13.1   Plt Count Latest Ref Range: 150 - 400 THOUS/MCL 371   MPV Latest Ref Range: 7.2 - 11.7 FL 8.1   Neutrophils % (A) Latest Units: % 54.5   ANC automated Latest Ref Range: 2.0 - 8.1 THOUS/MCL 6.2   Lymphocytes % Latest Units: % 27.5   Lymphocytes Absolute Latest Ref Range: 0.9 - 3.3 THOUS/MCL 3.1   Monocytes Latest Ref Range: 0.0 - 0.8 THOUS/MCL 0.9 (H)   Monocytes %  Latest Units: % 7.9   Eosinophils % Latest Units: % 8.9   Eosinophils Absolute Latest Ref Range: 0.0 - 0.5 THOUS/MCL 1.0 (H)   Basophils % Latest Units: % 1.2   Abs  Basophils Latest Ref Range: 0.0 - 0.2 THOUS/MCL 0.1   Diff Type Unknown DIFFERENTIAL PERFORMED BY AUTOMATED ANALYSIS   Pemphigoid Ab Pnl Res Unknown See Note   Pemphigus Ab Pnl Res Unknown See Note     I reviewed the results of the bone density as well as the mammogram  ASSESSMENT/PLAN:     pemphigus Vulgaris:  She is on immunosuppression so will go ahead and check CMP and CBC.  She was advised to try to watch her diet for night shade vegetables and decrease the sugar and carbs in her diet.    Prediabetes:  Will go ahead and check an A1c and since she has been on large dose of prednisone will go ahead and send her to Ophthalmology for evaluation    Obesity:  Will go ahead and check a TSH and we did talk about diet and try to stay away from sugar and carbs.    Teeth sensitivity:  Since she did have very dry mouth on exam will go ahead and refer her to dentistry for an evaluation and dental x-rays and I will also check for who Sjogren syndrome     History of nephrolithiasis:  Check the urine to check for protein and stone and will check uric acid level    Immunization:  Will give her the flu vaccine

## 2019-05-05 NOTE — Patient Instructions (Signed)
Try to watch sugar and carbs and avoid night shade vegetables.

## 2019-05-17 ENCOUNTER — Ambulatory Visit: Payer: PPO | Admitting: Dermatology

## 2019-05-17 VITALS — BP 137/83 | HR 87

## 2019-05-17 DIAGNOSIS — L100 Pemphigus vulgaris: Secondary | ICD-10-CM

## 2019-05-17 DIAGNOSIS — A6009 Herpesviral infection of other urogenital tract: Secondary | ICD-10-CM

## 2019-05-17 DIAGNOSIS — Z79899 Other long term (current) drug therapy: Secondary | ICD-10-CM

## 2019-05-17 DIAGNOSIS — B370 Candidal stomatitis: Secondary | ICD-10-CM

## 2019-05-17 MED ORDER — CLOTRIMAZOLE 10 MG MT TROC
10.0000 mg | Freq: Every day | OROMUCOSAL | 11 refills | Status: DC
Start: 2019-05-17 — End: 2020-06-26

## 2019-05-17 MED ORDER — PREDNISONE 10 MG OR TABS
30.0000 mg | ORAL_TABLET | Freq: Every day | ORAL | 0 refills | Status: DC
Start: 2019-05-17 — End: 2019-06-20

## 2019-05-17 NOTE — Progress Notes (Addendum)
Chuck Hint, MD PhD  Professor of Dermatology  [place], [place], [place] [address]  [address], [place] [id number]  [email or web address]  Phone: [phone] Fax: [phone]  [facility] Department of Dermatology    Date of Service:  05/17/2019    CHIEF COMPLAINT:   No chief complaint on file.     HPI:   Anita Turner is a 48 year old female who presents today for pemphigus vulgaris f/u. LV 04/05/2019.     Recently restarted on systemic therapy after flare 02/2019 (while off maintenance therapy). Currently on prednisone taper (prednisone 60mg  qAM x 3 weeks, then taper to 50 mg x 3 weeks), monthly IVIG (Carbafusion, started 03/2019), rituxan (03/2019), doxy, NCM, [place], MV.    - Today, reports no new oral/skin lesions  - She continues IVIG (Carbafusion)   - Currently taking steroid shot (40 mg) and benadryl (50 mg), Tylenol (325 mg x2), and xanax 1/2 tablet of 0.25 mg before infusions  - In the past, had rituxan and slowed rate and IV benadryl which helped.     At last visit, patient was also noted to have herpes genitalis in the perineum and was started on valacyclovir 500 mg BID x 10 days. Today, patient reports resolution.     Per prior note:  Pt notes she was diagnosed with pemphigus vulgaris and pemphigus foliacious. Has been off prednisone in June 2019. Off of Cellcept in March. Recently saw a physician in [facility] and was recommended Rituxan. Took one dose and was put on hold because of COVID. Pt notes that with induction of rituxan became very itchy. Was not alleviated with benadryl. Saw Dr. Trevor Mace in [facility] (Dr. Janace Litten previous resident).     Developed oral lesions ~early July 2020. Initially tongue involvement and then started involving gums and lips. Used magic mouthwash, TAC dental paste for flares. Prior flares would only last 3-4 days, but this time it's longer.     Recently  moved from [place], here to establish care    ROS:   Review of  Systems:  Constitutional: Negative for fever and weight loss  Cardiovascular: Negative for chest pain  Respiratory: Negative for shortness of breath  GU: Negative for dysuria and hematuria  Musculoskeletal: Negative for arthritis and muscle pain.  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    Social History:   reports that she has never smoked. She has never used smokeless tobacco. She reports previous alcohol use. She reports that she does not use drugs.    Family History:  family history includes Diabetes in her mother; Neurological Disease (Parkinsons/Huntingtons/Huntingtons Chorea) in her father; No Known Problems in her sister;  Thyroid in her brother.    Home Medications:  has a current medication list which includes the following prescription(s): alprazolam, diphenhydramine, melatonin, multiple vitamins-minerals, niacinamide, prednisone, and valacyclovir.    Allergies:   Patient has no known allergies.      PE:  GENERAL: Well developed, well nourished, in no acute distress. A/O x 3.    Skin Exam: Skin type: IV  Full body skin  exam performed including scalp, face, conjunctiva, lips, oral mucosa, neck, right upper, left upper, hands, nails, and the following was noted:    Pertinent Skin Findings:   - no oral lesions today  - few white patches in the oropharynx    Labs/Imaging:   02/07/2019    CMP wnl   CBC notable for mild leukocytosis 11.4 (stable since 09/2017 per CareEverywhere review)   IgA 203 (wnl)    02/07/2019   Basement Membrane Zone (BMZ) IgG, IgG4, and IgA  IgG: Negative, monkey esophagus substrate     Negative, human split skin substrate      IgG4: Negative, monkey esophagus substrate        Negative, human split skin substrate   IgA: Negative, monkey esophagus substrate     Negative, human split skin substrate   Positive IgG, including IgG4, cell surface antibodies,         monkey esophagus substrate     Enzyme Linked Immunosorbent Assay (ELISA)   --------------------------------------------------   Bullous Pemphigoid (BP) 180 and 230 IgG Antibodies   IgG BP 180 antibodies: 1 unit  NEGATIVE  IgG BP 230 antibodies: 1 unit  NEGATIVE    02/07/2019  Cell Surface IgG Antibodies   IgG: Positive, titer 1:2560 (H), monkey esophagus substrate     Positive, titer 1:640 (H), intact human skin substrate     Enzyme-Linked Immunosorbent Assay (ELISA)  -----------------------------------------  Desmoglein (DSG) 1 and 3 IgG Antibodies   IgG desmoglein 1 antibodies:  7 units  NEGATIVE    Reference Range:      Positive (H) = Greater than 20 units      Borderline/Indeterminate = 14-20 units      Negative = Less than 14 units   IgG desmoglein 3 antibodies: 320 units (H)  POSITIVE     (Initial level, 139 units, greater than high         calibrator; diluted to achieve 64 units, within      assay calibrators, and multiplied by the dilution      factor of 5)   COMMENTS   Specific   -------------------------------------------------   These indirect immunofluorescence results, demonstrating    positive IgG cell surface antibody reactivity on both   monkey esophagus and intact human skin substrates, support   the diagnosis of pemphigus.      Assessment & Plan:     # Pemphigus Vulgaris, recent recurrence of oral erosions 02/2019, now improving and stable  Diagnosed previously by outside dermatologist as pemphigus vulgaris and pemphigus foliaceus. Serum antibody titers on 02/07/2019 at [facility] c/w pemphigus vulgaris per above. (anti-Desmoglein 3 ab positive) Previously treated with prednisone (was on 60mg  daily with slow taper, off since June 2019), Cellcept (off since March 2020). Previously followed with Dr. Rebecka Apley in [place]. Received one dose of Rituxan (09/17/18) and put on hold due to [place].   Labs: last labs 02/07/2019 reviewed.  Tx:  - Rituxan weekly for Weeks 1, 2, and 3 and then IVIG on Week 4. (Started 03/2019)   - s/p 3 rituxan infusions so  far  -- continue IVIG monthly (started 03/2019)  -- Called Kabafusion, discussed  -- Benadryl push prior to IVIG and Xanax 2 hours before. Limit rate ~75.  -- Taper prednisone to 40mg  x 2w, then 30mg  x 2w, then 20mg  x 2w, then 10mg  x 2w, then likely 5mg  x 2w  -- Doxycycline 100mg  BID  -- Niacinamide 500mg  TID  -- Calcium 1g supplement daily (can be acquired over the counter)  -- Multivitamin 2 tabs daily (patient already taking multivitamin)    # Herpes genitalis  Resolved after valacyclovir 500 mg BID x 10 days.  - counseled she may need to take valtrex again for future breakouts    # Oral thrush  - clotrimazole 10mg  troches 6 times a day PRN     # Acanthosis nigricans  - CONTINUE hydrocortisone 2.5% cream BID to AA, discussed side effects of topical steroid use including skin thinning, telangiectasia, and atrophy if used continuously for long term    Patient given the opportunity to ask questions.  Side effects of medications have been discussed.  Sun precautions discussed and emphasized with the patient.      Return to Clinic 8 weeks or sooner prn     The  patient was seen and examined with Dr. Charm Barges, who agrees with the above assessment and plan.    Resident:Derenda Giddings Keene Breath, MD (resident, PGY4)   Attending: Chuck Hint, MD, PhD      I have seen, evaluated, and performed a history and physical as well as formulated an assessment and plan with the resident and I agree with the assessment and plan above.     Chuck Hint, MD, PhD

## 2019-05-17 NOTE — Patient Instructions (Addendum)
Decrease prednisone dose by 10mg  every 2 weeks.

## 2019-05-19 ENCOUNTER — Other Ambulatory Visit
Admission: RE | Admit: 2019-05-19 | Discharge: 2019-05-19 | Disposition: A | Discharge status: Home / Self Care | Payer: PPO | Source: Ambulatory Visit | Attending: Internal Medicine | Admitting: Internal Medicine

## 2019-05-19 DIAGNOSIS — E6609 Other obesity due to excess calories: Secondary | ICD-10-CM

## 2019-05-19 DIAGNOSIS — R7303 Prediabetes: Secondary | ICD-10-CM | POA: Insufficient documentation

## 2019-05-19 DIAGNOSIS — Z6838 Body mass index (BMI) 38.0-38.9, adult: Secondary | ICD-10-CM | POA: Insufficient documentation

## 2019-05-19 DIAGNOSIS — L100 Pemphigus vulgaris: Secondary | ICD-10-CM | POA: Insufficient documentation

## 2019-05-19 DIAGNOSIS — N200 Calculus of kidney: Secondary | ICD-10-CM | POA: Insufficient documentation

## 2019-05-19 DIAGNOSIS — L102 Pemphigus foliaceous: Secondary | ICD-10-CM | POA: Insufficient documentation

## 2019-05-19 LAB — CBC WITH DIFF, BLOOD
ANC automated: 9.2 10*3/uL — ABNORMAL HIGH (ref 2.0–8.1)
Basophils %: 0.7 %
Basophils Absolute: 0.1 10*3/uL (ref 0.0–0.2)
Eosinophils %: 0.3 %
Eosinophils Absolute: 0 10*3/uL (ref 0.0–0.5)
Hematocrit: 44.5 % — ABNORMAL HIGH (ref 34.0–44.0)
Hgb: 14.9 G/DL (ref 11.5–15.0)
Lymphocytes %: 27.5 %
Lymphocytes Absolute: 4 10*3/uL — ABNORMAL HIGH (ref 0.9–3.3)
MCH: 29.5 PG (ref 27.0–33.5)
MCHC: 33.5 G/DL (ref 32.0–35.5)
MCV: 88.2 FL (ref 81.5–97.0)
MPV: 7.6 FL (ref 7.2–11.7)
Monocytes %: 8.1 %
Monocytes Absolute: 1.2 10*3/uL — ABNORMAL HIGH (ref 0.0–0.8)
Neutrophils % (A): 63.4 %
PLT Count: 297 10*3/uL (ref 150–400)
RBC: 5.04 10*6/uL — ABNORMAL HIGH (ref 3.70–5.00)
RDW-CV: 16 % — ABNORMAL HIGH (ref 11.6–14.4)
White Bld Cell Count: 14.4 10*3/uL — ABNORMAL HIGH (ref 4.0–10.5)

## 2019-05-19 LAB — URINALYSIS MICROSCOPIC ONLY, RANDOM URINE: WBC, UA: <1 #/HPF (ref 0–5)

## 2019-05-19 LAB — URIC ACID, BLOOD: Uric Acid: 4.5 MG/DL (ref 2.3–6.6)

## 2019-05-19 LAB — LIPID(CHOL FRACT) PANEL, BLOOD
Cholesterol: 190 MG/DL (ref <200)
HDL Cholesterol: 48 MG/DL (ref >40)
LDL Cholesterol (calc): 97 MG/DL (ref <160)
Non HDL Cholesterol (calculated): 142 MG/DL — ABNORMAL HIGH (ref <130)
Triglycerides: 224 MG/DL — ABNORMAL HIGH (ref <150)
VLDL Cholesterol (calculated): 45 MG/DL

## 2019-05-19 LAB — COMPREHENSIVE METABOLIC PANEL, BLOOD
ALT: 22 U/L (ref 7–52)
AST: 20 U/L (ref 13–39)
Albumin: 3.5 G/DL — ABNORMAL LOW (ref 3.7–5.3)
Alk Phos: 60 U/L (ref 34–104)
BUN: 9 mg/dL (ref 7–25)
Bilirubin, Total: 0.7 mg/dL (ref 0.0–1.4)
CO2: 30 mmol/L (ref 21–31)
Calcium: 9.2 mg/dL (ref 8.6–10.3)
Chloride: 98 mmol/L (ref 98–107)
Creat: 0.7 mg/dL (ref 0.6–1.2)
Electrolyte Balance: 6 mmol/L (ref 2–12)
Glucose: 83 mg/dL (ref 70–115)
Potassium: 3.8 mmol/L (ref 3.5–5.1)
Protein, Total: 8.5 G/DL — ABNORMAL HIGH (ref 6.0–8.3)
Sodium: 134 mmol/L — ABNORMAL LOW (ref 136–145)
eGFR - high estimate: >60 (ref >59)
eGFR - low estimate: >60 (ref >59)

## 2019-05-19 LAB — VITAMIN D, 25-OH TOTAL: Vitamin D, 25-Hydroxy, Total: 29.7 ng/mL — ABNORMAL LOW (ref >30.0)

## 2019-05-19 LAB — TSH, BLOOD: TSH, Ultrasensitive: 2.222 u[IU]/mL (ref 0.450–4.120)

## 2019-05-20 ENCOUNTER — Encounter: Payer: Self-pay | Admitting: Internal Medicine

## 2019-05-20 LAB — GLYCOSYLATED HGB(A1C), BLOOD: Glycated Hgb, A1C: 6.5 % — ABNORMAL HIGH (ref 4.6–5.6)

## 2019-05-21 LAB — SSB, BLOOD: SSB (LA)(ENA) Ab, IgG: 5 U (ref 0–19)

## 2019-05-21 LAB — SSA (RO)(ENA) ANTIBODY, IGG: SSA (RO)(ENA) Ab, IgG: 29 U — ABNORMAL HIGH (ref 0–19)

## 2019-05-23 ENCOUNTER — Encounter: Payer: Self-pay | Admitting: Dermatology

## 2019-05-23 ENCOUNTER — Telehealth: Payer: Self-pay | Admitting: Dermatology

## 2019-05-23 MED ORDER — SELENIUM SULFIDE 2.5 % EX LOTN
1.0000 | TOPICAL_LOTION | Freq: Every day | CUTANEOUS | 0 refills | Status: DC
Start: 2019-05-23 — End: 2020-06-26

## 2019-05-23 NOTE — Telephone Encounter (Signed)
See MyChart encounter

## 2019-05-23 NOTE — Telephone Encounter (Signed)
Sheryle Spray, MD  Elveria Royals, RN 4 hours ago (12:31 PM)     Most likely tinea versicolor. Use selenium sulfide lotion for 15 min then rinse off once a day for one week    Message text

## 2019-05-23 NOTE — Telephone Encounter (Signed)
Caller developed a rash under right breast over the weekend. She said no pain or itchiness. Please assist. Thank you.

## 2019-05-23 NOTE — Telephone Encounter (Signed)
Per Dr. Charm Barges, sent in selenium sulfide 2.5% lotion to patient's pharmacy. Sent patient a message through [place] about the prescription

## 2019-05-23 NOTE — Telephone Encounter (Signed)
Called patient and patient stated that she has a new rash that is just red and scaly, not painful or itchy. Patient does not think it is her pemphigus. Patient will upload pictures into Mychart. Told her I will have Dr. Charm Barges look at them and see if we need to double book her.

## 2019-05-25 ENCOUNTER — Other Ambulatory Visit: Payer: Self-pay | Admitting: Dermatology

## 2019-05-26 MED ORDER — VALACYCLOVIR HCL 500 MG OR TABS
500.00 mg | ORAL_TABLET | Freq: Two times a day (BID) | ORAL | 0 refills | Status: DC
Start: 2019-05-26 — End: 2019-06-20

## 2019-05-27 ENCOUNTER — Encounter: Payer: Self-pay | Admitting: Retina Specialist

## 2019-05-27 ENCOUNTER — Ambulatory Visit: Payer: PPO | Attending: Retina Specialist | Admitting: Retina Specialist

## 2019-05-27 DIAGNOSIS — L100 Pemphigus vulgaris: Secondary | ICD-10-CM | POA: Insufficient documentation

## 2019-05-27 DIAGNOSIS — H43823 Vitreomacular adhesion, bilateral: Secondary | ICD-10-CM | POA: Insufficient documentation

## 2019-05-27 NOTE — Progress Notes (Signed)
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 Autumn Messing  Pt on Prednisone 40mg  daily PO-tapering from 80mg , has been on since July this year and also for two years from 2017.  Pt with Pemphigus Vulgaris.  Since starting Pred pt has noticed more difficulty reading street signs-lights are also blurry  Also notes outer corners OU-"are a bit baggy"  No irritation-notes intermittent heaviness -eye fatigue  No Ocular Surgeries OU  No gtts ou.  Pt states she as previously on Prednisone in 2017-stopped 2019  Pt recently located from [place] this summer.  Using IVIG.  1. Pemphigus vulgaris  2. Vitreomacular adhesion of both eyes  OU: 20/25 in OD and 20/20 in OS, clear cornea, 1+ nuclear sclerosis without PSC, no cell in AC/AV, good foveal contour, no SRF, no RT/RD  Observe   She feels more puffiness in her eyelids which got worse after steroid use.  _______________________________________   Extended Ophthalmoscopy 05/27/19  by slit lamp and indirect methods reveals:    Main Ophthalmology Exam     External Exam       Right Left    External Normal Normal          Slit Lamp Exam       Right Left    Lids/Lashes Normal for Age Normal for Age    Conjunctiva/Sclera White and quiet White and quiet    Cornea Clear Clear    Anterior Chamber Deep and quiet Deep and quiet    Iris Round, no rubeosis Round, no rubeosis    Lens Clear Clear    Vitreous Clear Clear          Fundus Exam       Right Left    Disc Healthy contour and size Healthy contour and size    Macula Normal contour and reflex for age, VMA, no SRF Normal contour and reflex for age, VMA, no SRF    Vessels Normal Normal    Periphery Flat and attached 360 degrees Flat and attached 360 degrees                  - See Assesment/plan above    Follow-up plan:  [facility]  OCTm FA ICG FAF OCTa OCTrnfl Bscan Other  Injection Laser   6 Months  ou               I reviewed and confirmed all history components, including the HPI; and any other elements performed by the technician. I performed the key and  critical portions of the exam. The final examination findings, image interpretations, and plan as documented in the record represent my personal judgement and conclusion.     Leola Brazil MD.

## 2019-05-31 ENCOUNTER — Encounter: Payer: Self-pay | Admitting: Dermatology

## 2019-05-31 ENCOUNTER — Telehealth: Payer: Self-pay | Admitting: Dermatology

## 2019-05-31 NOTE — Telephone Encounter (Signed)
Called Anita Turner and she is stating her rash under her breast is not improving and it is spreading a little. Dr. Charm Barges has no soon availability since he will be out of town. Suggested sooner appt with [place] PA on Monday at soonest available. Anita Turner does not want to come in next week bc of her IVIG. Booked Anita Turner on 11/9 8am with [place] PA but told Anita Turner to send new pictures on mychart and I will also have Dr. Charm Barges review.

## 2019-05-31 NOTE — Telephone Encounter (Signed)
Caller would like an appointment to see MD Grando for rash that was previously discussed. She tried the cream given to her but it is not improving. Please assist. Thank you.

## 2019-05-31 NOTE — Telephone Encounter (Signed)
From: Azzie Glatter  To: Sheryle Spray, MD  Sent: 05/31/2019 3:45 PM PDT  Subject: 1-Non Urgent Medical Advice    Hi,  The rash under my right breast has spread to corners. Sometimes it's itchy. Not always. Can you pls prescribe some thing for this condition?  Anita Turner

## 2019-06-08 ENCOUNTER — Other Ambulatory Visit: Payer: Self-pay | Admitting: Dermatology

## 2019-06-13 ENCOUNTER — Ambulatory Visit: Payer: PPO | Admitting: Physician Assistant

## 2019-06-17 ENCOUNTER — Ambulatory Visit: Payer: PPO | Admitting: Dermatology

## 2019-06-20 ENCOUNTER — Other Ambulatory Visit: Payer: Self-pay | Admitting: Dermatology

## 2019-06-20 MED ORDER — PREDNISONE 10 MG OR TABS
ORAL_TABLET | ORAL | 1 refills | Status: DC
Start: 2019-06-20 — End: 2020-04-28

## 2019-06-20 MED ORDER — VALACYCLOVIR HCL 500 MG OR TABS
500.0000 mg | ORAL_TABLET | Freq: Two times a day (BID) | ORAL | 0 refills | Status: DC
Start: 2019-06-20 — End: 2020-07-03

## 2019-06-20 MED ORDER — ALPRAZOLAM 0.25 MG OR TABS
0.2500 mg | ORAL_TABLET | ORAL | 3 refills | Status: DC | PRN
Start: 2019-06-20 — End: 2020-02-14

## 2019-06-21 ENCOUNTER — Encounter: Payer: Self-pay | Admitting: Dermatology

## 2019-07-12 ENCOUNTER — Telehealth: Payer: Self-pay | Admitting: Dermatology

## 2019-07-12 ENCOUNTER — Encounter: Payer: Self-pay | Admitting: Dermatology

## 2019-07-12 ENCOUNTER — Ambulatory Visit: Payer: PPO | Admitting: Dermatology

## 2019-07-12 VITALS — BP 142/82 | HR 110

## 2019-07-12 DIAGNOSIS — L100 Pemphigus vulgaris: Secondary | ICD-10-CM

## 2019-07-12 DIAGNOSIS — Z79899 Other long term (current) drug therapy: Secondary | ICD-10-CM

## 2019-07-12 DIAGNOSIS — B372 Candidiasis of skin and nail: Secondary | ICD-10-CM

## 2019-07-12 MED ORDER — MYCOPHENOLATE MOFETIL 500 MG OR TABS
1000.0000 mg | ORAL_TABLET | Freq: Two times a day (BID) | ORAL | 11 refills | Status: DC
Start: 2019-07-12 — End: 2020-07-16

## 2019-07-12 MED ORDER — CLOTRIMAZOLE-BETAMETHASONE 1-0.05 % EX CREA
1.0000 | TOPICAL_CREAM | Freq: Two times a day (BID) | CUTANEOUS | 11 refills | Status: DC
Start: 2019-07-12 — End: 2021-02-14

## 2019-07-12 NOTE — Progress Notes (Addendum)
Chuck Hint, MD PhD  Professor of Dermatology  [place], [place], [place] [address]  [address], [place] [id number]  [email or web address]  Phone: [phone] Fax: [phone]  [facility] Department of Dermatology    Date of Service:  07/12/2019    CHIEF COMPLAINT:   Chief Complaint   Patient presents with   . Rash     under arm      HPI:   Anita Turner is a 48 year old female who presents today for pemphigus vulgaris f/u. LV 05/17/2019.    Reports itch under bilateral arms x 3-4 days ago. The skin in the area feels rougher than normal, so wanted to check to see if anything needs to be changed for her treatment. Recently had blood work done which showed A1c 6.5, she is following with her PMD for this. Reports that her oral candidiasis has resolved so she has stopped clotrimazole troches.    For her pemphigus, currently taking:  - Prednisone 30mg  daily, tapering 10mg  q2weeks  - IVIG monthly  - Rituxan monthly (December will be her last month)  - Doxycycline and Niacinamide    Per initial HPI:  Pt notes she was diagnosed with pemphigus vulgaris and pemphigus foliacious. Has been off prednisone in June 2019. Off of Cellcept in March. Recently saw a physician in [facility] and was recommended Rituxan. Took one dose and was put on hold because of COVID. Pt notes that with induction of rituxan became very itchy. Was not alleviated with benadryl. Saw Dr. Trevor Mace in [facility] (Dr. Janace Litten previous resident).     Developed oral lesions ~early July 2020. Initially tongue involvement and then started involving gums and lips. Used magic mouthwash, TAC dental paste for flares. Prior flares would only last 3-4 days, but this time it's longer.     Recently  moved from [place], here to establish care    Review of Systems:  Constitutional: Negative for fever and weight loss  Cardiovascular: Negative for chest pain  Respiratory: Negative for shortness of breath  GU: Negative for  dysuria and hematuria  Musculoskeletal: Negative for arthritis and muscle pain.  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    Social History:   reports that she has never smoked. She has never used smokeless tobacco. She reports previous alcohol use. She reports that she does not use drugs.    Family History:  family history includes Cataract in her maternal grandfather and paternal grandfather; Diabetes in her father and mother; Neurological Disease (Parkinsons/Huntingtons/Huntingtons Chorea) in her father; No Known Problems in her sister; Thyroid in her brother.    Home Medications:  has a current medication list  which includes the following prescription(s): alprazolam, clotrimazole, diphenhydramine, melatonin, multiple vitamins-minerals, niacinamide, prednisone, prednisone, selenium sulfide, and valacyclovir.    Allergies:   Patient has no known allergies.      PE:  GENERAL: Well developed, well nourished, in no acute distress. A/O x 3.    Skin Exam: Skin type: IV  Full body skin exam performed including scalp, face,  conjunctiva, lips, oral mucosa, neck, right upper, left upper, hands, nails, and the following was noted:    - no oral lesions today  - no blisters/erosions today    Labs/Imaging:   02/07/2019    CMP wnl   CBC notable for mild leukocytosis 11.4 (stable since 09/2017 per CareEverywhere review)   IgA 203 (wnl)    02/07/2019   Basement Membrane Zone (BMZ) IgG, IgG4, and IgA  IgG: Negative, monkey esophagus substrate     Negative, human split skin substrate      IgG4: Negative, monkey esophagus substrate        Negative, human split skin substrate   IgA: Negative, monkey esophagus substrate     Negative, human split skin substrate   Positive IgG, including IgG4, cell surface antibodies,         monkey esophagus substrate     Enzyme Linked Immunosorbent Assay (ELISA)   --------------------------------------------------   Bullous Pemphigoid (BP) 180 and 230 IgG Antibodies   IgG BP 180 antibodies: 1 unit  NEGATIVE  IgG BP 230 antibodies: 1 unit  NEGATIVE    02/07/2019  Cell Surface IgG Antibodies   IgG: Positive, titer 1:2560 (H), monkey esophagus substrate     Positive, titer 1:640 (H), intact human skin substrate     Enzyme-Linked Immunosorbent Assay (ELISA)  -----------------------------------------  Desmoglein (DSG) 1 and 3 IgG Antibodies   IgG desmoglein 1 antibodies:  7 units  NEGATIVE    Reference Range:      Positive (H) = Greater than 20 units      Borderline/Indeterminate = 14-20 units      Negative = Less than 14 units   IgG desmoglein 3 antibodies: 320 units (H)  POSITIVE     (Initial level, 139 units, greater than high         calibrator; diluted to achieve 64 units, within      assay calibrators, and multiplied by the dilution      factor of 5)   COMMENTS   Specific   -------------------------------------------------   These indirect immunofluorescence results, demonstrating   positive IgG cell surface antibody reactivity on both   monkey esophagus  and intact human skin substrates, support   the diagnosis of pemphigus.      Assessment & Plan:     # Intertriginous candidiasis  # Acanthosis nigricans  - Discussed etiology likely due to diabetes (last A1c 6.5). Could be prednisone induced diabetes, patient is currently tapering off prednisone.  - START Lotrizone BID to AA bilateral axillae    # Pemphigus Vulgaris -- WELL CONTROLLED  - With recent recurrence of oral erosions 02/2019, so restarted on prednisone, now on taper    Treatment:  - CONTINUE Prednisone taper of 10mg  q2week until 10mg  daily and then taper to 5mg  daily x 2 weeks then OFF; Currently on 30mg  daily  - CONTINUE IVIG monthly (started 03/2019)  - CONTINUE Rituxan (Kabafusion, started 03/2019), now monthly  --- Benadryl push prior to IVIG and Xanax 2 hours before. Limit rate ~75.  --- Per patient December will be her last Rituxan infusion  -  Once she finishes her last round of Rituxan, TRANSITION to CellCept 1g BID (eRx'd, but patient instructed to wait until after last Rituxan infusion before starting)  - Check baseline CBC with diff and CMP prior to starting CellCept and then q6-8 weeks, standing orders placed today (expires 01/10/2020)  -- CONTINUE Doxycycline 100mg  BID  -- CONTINUE Niacinamide 500mg  TID  -- CONTINUE Calcium 1g supplement daily (can be acquired over the counter)  -- CONTINUE Multivitamin 2 tabs daily (patient already taking multivitamin)    # Oral thrush -- RESOLVED  - clotrimazole 10mg  troches 6 times a day PRN if recurs    Patient given the opportunity to ask questions.  Side effects of medications have been discussed.  Sun precautions discussed and emphasized with the patient.      Return to Clinic 8 weeks or sooner prn     The patient was seen and examined with the attending physician below, who agrees with the above assessment and plan.    Resident Physician:  Isidore Moos, MD/PhD (PGY-2)   Attending Physician:  Chuck Hint, MD, PhD    I have seen, evaluated, and  performed a history and physical as well as formulated an assessment and plan with the resident and I agree with the assessment and plan above.     Chuck Hint, MD, PhD

## 2019-07-12 NOTE — Telephone Encounter (Signed)
Per patient her armpit started getting itchy yesterday and the color of the skin has changed.  I have scheduled her for today in case she is flaring.

## 2019-07-12 NOTE — Telephone Encounter (Signed)
Pt is calling to request to speak with the Dr's nurse to see if the rash she has developed needs to be seen sooner than her 07/18/19 appt. Pls call her to assist. Thanks.

## 2019-07-18 ENCOUNTER — Ambulatory Visit: Payer: PPO | Admitting: Dermatology

## 2019-09-07 LAB — GENERIC EXTERNAL RESULT (UNMAPPED): HGBA1C%: 5.7 % — ABNORMAL HIGH (ref 4.6–5.6)

## 2019-09-13 ENCOUNTER — Ambulatory Visit: Payer: PPO | Admitting: Dermatology

## 2019-09-14 ENCOUNTER — Telehealth: Payer: Self-pay | Admitting: Dermatology

## 2019-09-14 NOTE — Telephone Encounter (Signed)
Caller is requesting medical records, informed her to contact medical records and she stated she already has and that nurse needs to put in request for them so she can take them out. Please assist. Thank you.

## 2019-09-14 NOTE — Telephone Encounter (Signed)
Spoke to patient, pt requesting all derm notes sent to [place]. Pt advised to contact medical records. Will need to sign medical release releasing records to [facility]. Patient given [facility] med records website for reference. Will call back if needed

## 2019-11-25 ENCOUNTER — Ambulatory Visit: Payer: PPO | Admitting: Retina Specialist

## 2020-01-25 ENCOUNTER — Telehealth: Payer: Self-pay | Admitting: Dermatology

## 2020-01-25 NOTE — Telephone Encounter (Signed)
Patient is scheduled for next available but would like a sooner appt with Dr.Grando. Please assist thank you.

## 2020-01-25 NOTE — Telephone Encounter (Signed)
Patient reports she has not had any insurance since the start of the year- she stoppped all infusions as of Jan.  Then she went to [place].  Her [facility] MD started her on mycophenolate in April.  I told Anita Turner to get any maintenance lab her other provider needs.  I asked that she bring [place] copies of the lab.  Scheduled her for July.  She will be discontinuing her [employer] on 6/30 and switching to [employer].

## 2020-02-14 ENCOUNTER — Ambulatory Visit: Payer: No Typology Code available for payment source | Admitting: Dermatology

## 2020-02-14 VITALS — BP 122/79 | HR 90

## 2020-02-14 DIAGNOSIS — Z79899 Other long term (current) drug therapy: Secondary | ICD-10-CM

## 2020-02-14 DIAGNOSIS — F419 Anxiety disorder, unspecified: Secondary | ICD-10-CM

## 2020-02-14 DIAGNOSIS — L100 Pemphigus vulgaris: Secondary | ICD-10-CM

## 2020-02-14 MED ORDER — NIACINAMIDE 500 MG OR TABS
500.0000 mg | ORAL_TABLET | Freq: Three times a day (TID) | ORAL | 11 refills | Status: DC
Start: 2020-02-14 — End: 2020-09-19

## 2020-02-14 MED ORDER — MYCOPHENOLATE MOFETIL 500 MG OR TABS
1000.0000 mg | ORAL_TABLET | Freq: Two times a day (BID) | ORAL | 11 refills | Status: DC
Start: 2020-02-14 — End: 2021-02-14

## 2020-02-14 MED ORDER — MULTIVITAMINS OR CAPS
1.0000 | ORAL_CAPSULE | Freq: Every day | ORAL | 0 refills | Status: AC
Start: 2020-02-14 — End: ?

## 2020-02-14 MED ORDER — CHOLECALCIFEROL 2000 UNIT OR CAPS: ORAL_CAPSULE | ORAL | Status: AC

## 2020-02-14 MED ORDER — DOXYCYCLINE MONOHYDRATE 100 MG OR CAPS
100.0000 mg | ORAL_CAPSULE | Freq: Two times a day (BID) | ORAL | 0 refills | Status: DC
Start: 2020-02-14 — End: 2020-06-26

## 2020-02-14 MED ORDER — ALPRAZOLAM 0.25 MG OR TABS
0.2500 mg | ORAL_TABLET | ORAL | 3 refills | Status: DC | PRN
Start: 2020-02-14 — End: 2020-04-02

## 2020-02-14 NOTE — Progress Notes (Addendum)
Chuck Hint, MD PhD  Professor of Dermatology  [place], [place], [place] [address]  [address], [place] [address]  [email or web address]  Phone: [phone] Fax: [phone]  [facility] Department of Dermatology    Date of Service:  02/14/2020    CHIEF COMPLAINT:   Chief Complaint   Patient presents with   . Derm Problem     Follow up      HPI:   Anita Turner is a 49 year old female who presents today for pemphigus vulgaris f/u. LV 07/12/2019, prescribed lotrizone for intertriginous candidiasis.     After last visit, patient lost insurance so had a gap in her care. Finished prednisone in 07/2019. Last IVIG and rituxan in 07/2019 (did 6 treatments, once monthly). Started Cellcept in 09/2018. Today reports no new lesions, has not had symptoms since at least December 2020 (7 months). Yesterday had crackles in the left side of the mouth.    Per initial HPI:  Pt notes she was diagnosed with pemphigus vulgaris and pemphigus foliacious. Has been off prednisone in June 2019. Off of Cellcept in March. Recently saw a physician in [facility] and was recommended Rituxan. Took one dose and was put on hold because of COVID. Pt notes that with induction of rituxan became very itchy. Was not alleviated with benadryl. Saw Dr. Trevor Mace in [facility] (Dr. Janace Litten previous resident).     Developed oral lesions ~early July 2020. Initially tongue involvement and then started involving gums and lips. Used magic mouthwash, TAC dental paste for flares. Prior flares would only last 3-4 days, but this time it's longer.     Recently  moved from [place], here to establish care    Review of Systems:  Constitutional: Negative for fever and weight loss  Cardiovascular: Negative for chest pain  Respiratory: Negative for shortness of breath  GU: Negative for dysuria and hematuria  Musculoskeletal: Negative for arthritis and muscle pain.  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    Social History:   reports that she has never smoked. She has never used smokeless tobacco. She reports previous alcohol use. She reports that she does not use drugs.    Family History:  family history includes Cataract in her maternal grandfather and paternal grandfather; Diabetes in her father and mother; Neurological Disease (Parkinsons/Huntingtons/Huntingtons Chorea) in her father; No Known Problems in her sister; Thyroid in her brother.    Home Medications:  has a current medication list which includes the following prescription(s): alprazolam, clotrimazole, clotrimazole-betamethasone, diphenhydramine, melatonin, multiple vitamin, mycophenolate mofetil, niacinamide, prednisone, prednisone, selenium sulfide, valacyclovir, and vitamin d3.    Allergies:   Patient has no known  allergies.      PE:  GENERAL: Well developed, well nourished, in no acute distress. A/O x 3.    Skin Exam: Skin type: IV  Full body skin exam performed including scalp, face, conjunctiva, lips, oral mucosa, neck, right upper, left upper, hands, nails, and the following was  noted:    - no oral lesions today  - no blisters/erosions today    Labs/Imaging:     01/23/20 (from [facility])  WBC 11  HB 13.8   PLT 406    02/07/2019   Basement Membrane Zone (BMZ) IgG, IgG4, and IgA  IgG: Negative, monkey esophagus substrate     Negative, human split skin substrate      IgG4: Negative, monkey esophagus substrate        Negative, human split skin substrate   IgA: Negative, monkey esophagus substrate     Negative, human split skin substrate   Positive IgG, including IgG4, cell surface antibodies,         monkey esophagus substrate     Enzyme Linked Immunosorbent Assay (ELISA)   --------------------------------------------------   Bullous Pemphigoid (BP) 180 and 230 IgG Antibodies   IgG BP 180 antibodies: 1 unit  NEGATIVE  IgG BP 230 antibodies: 1 unit  NEGATIVE    02/07/2019  Cell Surface IgG Antibodies   IgG: Positive, titer 1:2560 (H), monkey esophagus substrate     Positive, titer 1:640 (H), intact human skin substrate     Enzyme-Linked Immunosorbent Assay (ELISA)  -----------------------------------------  Desmoglein (DSG) 1 and 3 IgG Antibodies   IgG desmoglein 1 antibodies:  7 units  NEGATIVE    Reference Range:      Positive (H) = Greater than 20 units      Borderline/Indeterminate = 14-20 units      Negative = Less than 14 units   IgG desmoglein 3 antibodies: 320 units (H)  POSITIVE     (Initial level, 139 units, greater than high         calibrator; diluted to achieve 64 units, within      assay calibrators, and multiplied by the dilution      factor of 5)   COMMENTS   Specific   -------------------------------------------------   These indirect immunofluorescence results, demonstrating   positive IgG cell surface antibody reactivity on both   monkey esophagus and intact human skin substrates, support   the diagnosis of pemphigus.      Assessment & Plan:     # Pemphigus Vulgaris -- WELL CONTROLLED  In remission, has no current oral  ulcers. Previously on prednisone, max dose 80mg  daily, tapered off in 07/2019.     Treatment:  - START IVIG monthly for 6 months, then taper  - INCREASE Cellcept to 1000mg  BID (prescribed today)  - Check baseline CBC with diff and CMP prior to starting CellCept and then q6-8 weeks, standing orders placed today (expires 01/10/2020)  -- CONTINUE Doxycycline 100mg  BID  -- CONTINUE Niacinamide 500mg  TID  -- CONTINUE Calcium 1g supplement daily (can be acquired over the counter)  -- CONTINUE Multivitamin 2 tabs daily (patient already taking multivitamin)    #Anxiety  - Refilled alprazolam 0.25mg  po PRN    Patient given the opportunity to ask questions.  Side effects of medications have been discussed.  Sun precautions discussed and emphasized with the patient.      Return to Clinic 8 weeks or sooner prn     The patient was seen and examined with the attending physician below, who  agrees with the above assessment and plan.    Resident Physician:  Santa Genera, MD   Attending Physician:  Chuck Hint, MD, PhD  I have seen, evaluated, and performed a history and physical as well as formulated an assessment and plan with the resident and I agree with the assessment and plan above. High level of complexity.    Chuck Hint, MD, PhD

## 2020-02-16 NOTE — Addendum Note (Signed)
Addended by: Chuck Hint A on: 02/16/2020 05:55 PM     Modules accepted: Level of Service

## 2020-03-06 ENCOUNTER — Telehealth: Payer: Self-pay | Admitting: Retina Specialist

## 2020-03-06 NOTE — Telephone Encounter (Signed)
LVM regarding scheduling appt with Riazi , please further assist.

## 2020-03-08 ENCOUNTER — Telehealth: Payer: Self-pay | Admitting: Ob/Gyn

## 2020-03-08 NOTE — Telephone Encounter (Signed)
Patient calling to schedule an appointment with Dr. Aron Baba for 9/13 however requesting a sooner appointment in the morning and in the afternoon if appointment is going to be after August 30th.   Added patient to waitlist. If you can please assist. Thank you.

## 2020-03-09 NOTE — Telephone Encounter (Signed)
03/09/20 no sooner appointment. Pt has been added to wait list and will be contacted if anything sooner opens up. AM

## 2020-03-12 ENCOUNTER — Ambulatory Visit: Payer: No Typology Code available for payment source | Admitting: Internal Medicine

## 2020-03-26 ENCOUNTER — Ambulatory Visit: Payer: No Typology Code available for payment source | Admitting: Dermatology

## 2020-03-28 ENCOUNTER — Telehealth: Payer: Self-pay | Admitting: Dermatology

## 2020-03-28 NOTE — Telephone Encounter (Signed)
Patient states that she has a mole on her forehead that she is quite worried about and would like to be seen tomorrow or Friday, if possible. If not, she states that any day next week after 4pm would also work. Please assist. Thank you

## 2020-03-29 ENCOUNTER — Encounter: Payer: Self-pay | Admitting: Dermatology

## 2020-03-29 NOTE — Telephone Encounter (Signed)
Pt was scheduled 04/05/20 with Dr. Charm Barges.

## 2020-04-02 ENCOUNTER — Encounter: Payer: Self-pay | Admitting: Dermatology

## 2020-04-02 ENCOUNTER — Ambulatory Visit (INDEPENDENT_AMBULATORY_CARE_PROVIDER_SITE_OTHER): Payer: No Typology Code available for payment source | Admitting: Dermatology

## 2020-04-02 VITALS — BP 106/65 | HR 95

## 2020-04-02 DIAGNOSIS — Z79899 Other long term (current) drug therapy: Secondary | ICD-10-CM

## 2020-04-02 DIAGNOSIS — L821 Other seborrheic keratosis: Secondary | ICD-10-CM

## 2020-04-02 DIAGNOSIS — F419 Anxiety disorder, unspecified: Secondary | ICD-10-CM

## 2020-04-02 DIAGNOSIS — L100 Pemphigus vulgaris: Secondary | ICD-10-CM

## 2020-04-02 MED ORDER — ALPRAZOLAM 0.25 MG OR TABS
0.2500 mg | ORAL_TABLET | ORAL | 3 refills | Status: DC | PRN
Start: 2020-04-02 — End: 2020-04-24

## 2020-04-02 NOTE — Patient Instructions (Signed)
It was nice to see you in clinic today!  You were seen for a seborrheic keratosis

## 2020-04-02 NOTE — Progress Notes (Signed)
Chuck Hint, MD PhD  Professor of Dermatology  [place], [place], [place] [address]  [address], [place] [address]  [email or web address]  Phone: [phone] Fax: [phone]  [facility] Department of Dermatology    Date of Service:  04/02/2020    CHIEF COMPLAINT:   Chief Complaint   Patient presents with   . Follow Up     skin lesions      HPI:   Anita Turner is a 49 year old female who presents today for pemphigus vulgaris f/u. LV 02/14/20.     Resumed treatment regimen last visit after an insurance-related gap in care.  She experienced a fever after her last IVIG treatment.  In the interim evaluated at [facility] ED for headache.    Per initial HPI:  Pt notes she was diagnosed with pemphigus vulgaris and pemphigus foliacious. Has been off prednisone in June 2019. Off of Cellcept in March. Recently saw a physician in [facility] and was recommended Rituxan. Took one dose and was put on hold because of COVID. Pt notes that with induction of rituxan became very itchy. Was not alleviated with benadryl. Saw Dr. Trevor Mace in [facility] (Dr. Janace Litten previous resident).   Developed oral lesions ~early July 2020. Initially tongue involvement and then started involving gums and lips. Used magic mouthwash, TAC dental paste for flares. Prior flares would only last 3-4 days, but this time it's longer.   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    Social History:   reports that she has never smoked. She has never used smokeless tobacco. She reports previous alcohol use. She reports that she does not use drugs.    Family History:  family history includes Cataract in her maternal grandfather and paternal grandfather; Diabetes in her father and mother; Neurological Disease (Parkinsons/Huntingtons/Huntingtons Chorea) in her father; No Known Problems in her sister; Thyroid in her brother.    Home Medications:  has a current medication list which includes the following prescription(s): alprazolam, clotrimazole, clotrimazole-betamethasone, diphenhydramine, doxycycline, melatonin, multivitamin, multiple vitamin, mycophenolate mofetil, mycophenolate mofetil, niacinamide, niacinamide, prednisone, prednisone, selenium sulfide, valacyclovir, and vitamin d3.    Allergies:   Patient has no known allergies.      PE:  GENERAL: Well developed, well nourished, in no acute distress. A/O x 3.    Skin Exam: Skin type: IV  Full body skin exam performed including scalp, face, conjunctiva, lips, oral mucosa, neck, right upper, left upper, hands, nails, and the following was noted:    - no oral lesions today  - no blisters/erosions today  - Stuck on brown papule, right temple    Labs/Imaging:     03/12/20 (from [facility]- care everywhere)  Reviewed and unremarkable  WBC 14.6    02/07/2019   Basement Membrane Zone (BMZ) IgG, IgG4, and IgA  IgG: Negative, monkey esophagus substrate     Negative, human split skin substrate      IgG4: Negative, monkey esophagus substrate        Negative, human split skin substrate   IgA: Negative, monkey esophagus substrate     Negative, human split skin substrate   Positive IgG, including IgG4, cell surface antibodies,         monkey esophagus substrate      Enzyme Linked Immunosorbent Assay (ELISA)   --------------------------------------------------   Bullous Pemphigoid (BP) 180 and 230 IgG Antibodies   IgG BP 180 antibodies: 1 unit  NEGATIVE  IgG BP 230 antibodies: 1 unit  NEGATIVE    02/07/2019  Cell Surface IgG Antibodies   IgG: Positive, titer 1:2560 (H), monkey esophagus substrate     Positive, titer 1:640 (H), intact human skin substrate     Enzyme-Linked Immunosorbent Assay (ELISA)  -----------------------------------------  Desmoglein (DSG) 1 and 3 IgG Antibodies   IgG desmoglein 1 antibodies:  7 units  NEGATIVE    Reference Range:      Positive (H) = Greater than 20 units      Borderline/Indeterminate = 14-20 units      Negative = Less than 14 units   IgG desmoglein 3 antibodies: 320 units (H)  POSITIVE     (Initial level, 139 units, greater than high         calibrator; diluted to achieve 64 units, within      assay calibrators, and multiplied by the dilution      factor of 5)   COMMENTS   Specific   -------------------------------------------------   These indirect immunofluorescence results, demonstrating   positive IgG cell surface antibody reactivity on both   monkey esophagus and intact human skin substrates, support   the diagnosis of pemphigus.      Assessment & Plan:     # Pemphigus Vulgaris -- WELL CONTROLLED  #High risk medication monitoring  In remission, has no current oral ulcers. Previously on prednisone, max dose 80mg  daily, tapered off in 07/2019.   - Continue IVIG monthly for 6 months, then taper  - Continue Cellcept to 1000mg  BID   - CBC with diff and CMP q6-8 weeks (reviewed from 8/9)  -- CONTINUE Doxycycline 100mg  BID  -- CONTINUE Niacinamide 500mg  TID  -- CONTINUE Calcium 1g supplement daily (can be acquired over the counter)  -- CONTINUE Multivitamin 2 tabs daily (patient already taking multivitamin)    #Anxiety  - Refilled alprazolam 0.25mg  po PRN    #Seborrheic keratosis, right temple  - No  concerning features on exam today  - Follow up if lesions changing, growing, become symptomatic  - Sun protection, sunscreen      Patient given the opportunity to ask questions.  Side effects of medications have been discussed.  Sun precautions discussed and emphasized with the patient.      Return to Clinic 8 weeks or sooner prn     The patient was seen and examined with the attending physician below, who agrees with the above assessment and plan.    Resident: Rose Phi, MD PGY-4  Attending: Chuck Hint, MD, PhD

## 2020-04-05 ENCOUNTER — Ambulatory Visit: Payer: Self-pay | Admitting: Dermatology

## 2020-04-16 ENCOUNTER — Ambulatory Visit: Payer: No Typology Code available for payment source | Admitting: Dermatology

## 2020-04-16 ENCOUNTER — Ambulatory Visit (INDEPENDENT_AMBULATORY_CARE_PROVIDER_SITE_OTHER): Payer: No Typology Code available for payment source | Admitting: Ob/Gyn

## 2020-04-16 ENCOUNTER — Encounter: Payer: Self-pay | Admitting: Ob/Gyn

## 2020-04-16 VITALS — BP 119/65 | HR 90 | Temp 97.1°F | Wt 201.7 lb

## 2020-04-16 DIAGNOSIS — Z01419 Encounter for gynecological examination (general) (routine) without abnormal findings: Secondary | ICD-10-CM

## 2020-04-16 NOTE — Progress Notes (Signed)
[id number]   [place]  04/16/2020   Gender: female  Current Location: [facility]  DOB: 1971/02/02      OB/GYN      Amb Note, Gynecology New Patient  Clinician Documentation:    Jewel Baize, M.D,  [facility]   OB/GYN    Chief Complaint: WWE    History of Present Illness:  Anita Turner is a 49 year old female, G2P1011  LMP: No LMP recorded. (Menstrual status: IUD).    History of genetic disorder pemphigus vulgaris.  Periods were previously very heavy until IUD.  Lyletta IUD was placed October 2019.  Has occasional pain every 15-20 days with slight spotting.  Takes melatonin for difficulty sleeping due to menopausal symptoms.  Menarche at 85, regular until her second marriage.  Was unable to get pregnant so she used Clomid 3-4x and had a child.  History of TAB around that time.  Last pap was in 2019.  Last mammogram May 2020, benign.  Taking daily IVIg and immunosuppressants  S/p cholecystectomy and appendectomy.      Medications:  Current Outpatient Medications:     ALPRAZolam (XANAX) 0.25 MG tablet, Take 1 tablet (0.25 mg) by mouth as needed for Anxiety (2 hours prior to IV infusion)., Disp: 10 tablet, Rfl: 3    clotrimazole (MYCELEX) 10 MG troche, Take 1 tablet (10 mg) by mouth 5 times daily., Disp: 56 Troche, Rfl: 11    clotrimazole-betamethasone (LOTRISONE) 1-0.05 % cream, Apply 1 Application topically 2 times daily. Use a small amount as directed to armpits, Disp: 1 Tube, Rfl: 11    diphenhydrAMINE (BENADRYL) 50 MG/ML injection, Please give 50 mg IV push prior to Rituxan infusions.  If needed may give a repeat 25 mg IV push dose., Disp: 1 vial, Rfl: 8    doxycycline (MONODOX) 100 MG capsule, Take 1 capsule (100 mg) by mouth 2 times daily., Disp: 14 capsule, Rfl: 0    melatonin 3 MG capsule, Take 1 capsule (3 mg) by mouth nightly., Disp: 30 capsule, Rfl:     Multiple Vitamin (MULTIVITAMIN) capsule, Take 1 capsule by mouth daily., Disp: 30 tablet, Rfl: 0    Multiple  Vitamins-Minerals (MULTIVITAMIN ADULT PO), multivitamin  daily, Disp: , Rfl:     mycoPHENOLate mofetil (CELLCEPT) 500 MG tablet, Take 2 tablets (1,000 mg) by mouth 2 times daily., Disp: 60 tablet, Rfl: 11    mycoPHENOLate mofetil (CELLCEPT) 500 MG tablet, Take 2 tablets (1,000 mg) by mouth 2 times daily. Start this after you finish your rituxan infusion regimen (Patient taking differently: Take 500 mg by mouth daily. Start this after you finish your rituxan infusion regimen), Disp: 120 tablet, Rfl: 11    niacinamide 500 MG tablet, Take 1 tablet (500 mg) by mouth 3 times daily (with meals)., Disp: 30 tablet, Rfl: 11    Niacinamide 500 MG TBCR, Take 500 mg by mouth 3 times daily., Disp: 90 tablet, Rfl: 11    predniSONE (DELTASONE) 10 MG tablet, TAKE 3 TABLETS BY MOUTH EVERY DAY, Disp: 90 tablet, Rfl: 1    predniSONE (DELTASONE) 20 MG tablet, 2 and a half tablet a day now, Disp: , Rfl:     selenium sulfide (SELSUN) 2.5 % lotion, Apply 1 Application topically daily. Apply daily for 15 min then rinse off for 1 week, Disp: 1 bottle, Rfl: 0    valACYclovir (VALTREX) 500 MG tablet, Take 1 tablet (500 mg) by mouth 2 times daily. Take 1 tablet twice a day  for 10 days, Disp: 20 tablet, Rfl: 0    vitamin D3 50 MCG (2000 UT) capsule, by Oral route., Disp: , Rfl:     Allergies:  No Known Allergies    Gyn/Ob History:  Gyn History     LMP: IUD    Age at Menarche: 72    Age at First Pregnancy: 3    Age at Menopause:     Gyn History Comments: LMP:04/08/2019, has IUD    Sexual Activity: Yes; Female    Contraception: No contraception data on record         OB History   Gravida Para Term Preterm AB Living   1 0 0 0 0 0   SAB TAB Ectopic Multiple Live Births   0 0 0 0 0        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    Class 2 obesity due to excess calories without serious comorbidity with body mass index (BMI) of 38.0 to 38.9 in adult  04/30/2017    Foot pain 06/23/2018    Ingrowing toenail 06/23/2018    Nephrolithiasis     Obesity with body mass index 30 or greater 09/05/2016    Osteoarthritis of knee 09/05/2016    Pain of right thumb 02/06/2016    Patellofemoral stress syndrome 08/22/2016    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    Pemphigus vulgaris 03/18/2019    Prediabetes 06/14/2018    Shortness of breath 03/18/2017    Strain of muscle of right hip 09/05/2016    Synovitis 02/06/2016        Past Surgical History:  Past Surgical History:   Procedure Laterality Date    CHOLECYSTECTOMY  2017    APPENDECTOMY  2002        Social History:  Social History     Socioeconomic History    Marital status: Married     Spouse name: Not on file    Number of children: Not on file    Years of education: Not on file    Highest education level: Not on file   Occupational History    Not on file   Tobacco Use    Smoking status: Never Smoker    Smokeless tobacco: Never Used   Substance and Sexual Activity    Alcohol use: Not Currently    Drug use: Never    Sexual activity: Yes     Partners: Male   Other Topics Concern    Not on file   Social History Narrative    Not on file     Social Determinants of Health     Financial Resource Strain:     Difficulty of Paying Living Expenses:    Food Insecurity:     Worried About Charity fundraiser in the Last Year:     Arboriculturist in the Last Year:    Transportation Needs:     Film/video editor (Medical):     Lack of Transportation (Non-Medical):    Physical Activity:     Days of Exercise per Week:     Minutes of Exercise per Session:    Stress:     Feeling of Stress :    Social Connections:     Frequency of Communication with Friends and Family:     Frequency of Social Gatherings with Friends and Family:     Attends  Religious Services:     Active Member of Clubs or Organizations:     Attends Music therapist:     Marital Status:    Intimate Partner Violence:     Fear of Current or  Ex-Partner:     Emotionally Abused:     Physically Abused:     Sexually Abused:        Family History:  Family History   Problem Relation Name Age of Onset    Diabetes Mother          HTN, DJD    Neurological Disease (Parkinsons/Huntingtons/Huntingtons Chorea) Father          DM, alcoholism    Diabetes Father      No Known Problems Sister      Thyroid Brother      Cataract Maternal Grandfather      Cataract Paternal Grandfather         Review of Systems:  . Constitutional:  Denies fever, chills, weight loss, weight gain  . Head & Neck:  Denies headache, vision change, hearing change, sore throat  . Respiratory:  Denies shortness of breath, cough  . Cardiovascular:  Denies chest pain/pressure, orthopnea, PND, palpitations  . Gastrointestinal:  Denies abdominal pain, nausea, vomiting, diarrhea, or rectal bleeding  . Genitourinary:  Denies dysuria, polyuria, hesitancy, frequency  . Musculoskeletal:  Denies joint pain, leg edema  . Neurological:  Denies  focal numbness, weakness  . All other Review of Systems is negative   .   Physical Exam:   Vital Signs:  BP 119/65 (BP Location: Left arm, BP Patient Position: Sitting)    Pulse 90    Temp 97.1 F (36.2 C) (Temporal)    Wt 91.5 kg (201 lb 11.5 oz)    BMI 39.40 kg/m     General: Female, well developed. No apparent distress.    Eyes: Normal conjunctiva.   ENT: No external lesions. Neck is supple. No masses.   Resp: Normal respiratory effort.   CV: No LE edema.    Breast:  bilateral symmetric, no masses or tenderness, no axillary masses or lymph nodes.  No skin changes or nipple discharge.   GI: Abdomen soft nontender non-distended, no hsm   GU/Pelvic:    BUS: no lesions, masses or tenderness   CX/VAG: no discharge, lesions or odor, + string   Biman: normal uterus size and contour, no cmt.  No adnexal masses or tenderness    Musculoskeletal: Normal gait and station. Normal range of motion.    Skin: No rash, lesions, ulcers.    Extremities:   no  edema   Psych: Normal judgement/ insight. Awake and oriented x 3.     Diagnostic Data:    Assessment:  Shelbie Gantiis a 49 year old  here for NEW WWE        Plan:     Annual mammogram ordered.  Recommended as a screening test for breast cancer. SBE done and demonstrated.  Discussed the new information that will be given to her regarding her mammogram results if she has dense breast. She was informed to call our office if she has any questions or wants to discuss further screening test, i.e. ultrasound or mri. , SBE demonstrated and recommended monthly and Dietary calcium, Vit D and weight bearing exercises encouraged    -STD prevention    Pap w/ HPV    f/u pmd for labs and other routine screening and immunizations    -Healthy diet and exercise    -  Return 1 year for annual exam and/or rx refill    - Recommended supplements for menopausal symptoms given in AVS    - Follow up as needed      Jaquita Folds, MD  [place] Associate Clinical Professor  [facility]   Dept of Ob/Gyn      Scribe Attestation  The notes I am recording reflect only actions made by and judgments taken by this provider, Dr. Philmore Pali, for whom I am scribing today.  I have performed no independent clinical work.    Weston Settle    ____________________________________________________________________    Provider Attestation for Scribed Note    As the attending provider, I agree with the scribed content.  Any changes or edits are noted in the text above.    Jaquita Folds

## 2020-04-16 NOTE — Patient Instructions (Addendum)
Recommended supplements for menopausal symptoms:  - Estroven, take as directed  - Melatonin, take as directed as needed  Your Provider has ordered for you to complete a Mammogram. This test is completed in our radiology department, please see a list of [place] radiology locations below that perform this test.     [facility]  [address] [place], [place] [address]  Phone: [phone]  Hours: Monday through Friday, 8 a.m. to 5 p.m. (except holidays)          Weekends, 7:30 a.m. to 4:30 p.m. (except holidays)    [facility]  [place], [place] [address]  [address]  Hours: Monday through Friday, 8 a.m. to 5 p.m. (except holidays)    [address] [place], [place] [address]  Phone: [phone]      [email or web address]     Mammogram  A mammogram is an X-ray of the breasts that is done to check for abnormal changes. This procedure can screen for and detect any changes that may suggest breast cancer. A mammogram can also identify other changes and variations in the breast, such as:   Inflammation of the breast tissue (mastitis).   An infected area that contains a collection of pus (abscess).   A fluid-filled sac (cyst).   Fibrocystic changes. This is when breast tissue becomes denser, which can make the tissue feel rope-like or uneven under the skin.   Tumors that are not cancerous (benign).  Tell a health care provider about:   Any allergies you have.   If you have breast implants.   If you have had previous breast disease, biopsy, or surgery.   If you are breastfeeding.   Any possibility that you could be pregnant, if this applies.   If you are younger than age 52.   If you have a family history of breast cancer.  What are the risks?  Generally, this is a safe procedure. However, problems may occur, including:   Exposure to radiation. Radiation levels are very low with  this test.   The results being misinterpreted.   The need for further tests.   The inability of the mammogram to detect certain cancers.  What happens before the procedure?   Schedule your test about 1-2 weeks after your menstrual period. This is usually when your breasts are the least tender.   If you have had a mammogram done at a different facility in the past, get the mammogram X-rays or have them sent to your current exam facility in order to compare them.   Wash your breasts and under your arms the day of the test.   Do not wear deodorants, perfumes, lotions, or powders anywhere on your body on the day of the test.   Remove any jewelry from your neck.   Wear clothes that you can change into and out of easily.  What happens during the procedure?   You will undress from the waist up and put on a gown.   You will stand in front of the X-ray machine.   Each breast will be placed between two plastic or glass plates. The plates will compress your breast for a few seconds. Try to stay as relaxed as possible during the procedure. This does not cause any harm to your breasts and any discomfort you feel will be very brief.   X-rays will be taken from different angles of each  breast.  The procedure may vary among health care providers and hospitals.  What happens after the procedure?   The mammogram will be examined by a specialist (radiologist).   You may need to repeat certain parts of the test, depending on the quality of the images. This is commonly done if the radiologist needs a better view of the breast tissue.   Ask when your test results will be ready. Make sure you get your test results.   You may resume your normal activities.  Summary   A mammogram is an X-ray of the breasts that is done to check for abnormal changes. This procedure can screen for and detect any changes that may suggest breast cancer.   If you have had a mammogram done at a different facility in the past, get the mammogram  X-rays or have them sent to your current exam facility in order to compare them.   Ask when your test results will be ready. Make sure you get your test results.  This information is not intended to replace advice given to you by your health care provider. Make sure you discuss any questions you have with your health care provider.  Document Released: 07/18/2000 Document Revised: 03/05/2017 Document Reviewed: 09/29/2014  Elsevier Interactive Patient Education  2019 [employer].

## 2020-04-24 ENCOUNTER — Ambulatory Visit: Payer: No Typology Code available for payment source | Admitting: Internal Medicine

## 2020-04-24 ENCOUNTER — Encounter: Payer: Self-pay | Admitting: Internal Medicine

## 2020-04-24 VITALS — BP 125/63 | HR 77 | Temp 98.1°F | Resp 16 | Ht 60.0 in | Wt 205.0 lb

## 2020-04-24 DIAGNOSIS — L100 Pemphigus vulgaris: Secondary | ICD-10-CM

## 2020-04-24 DIAGNOSIS — F419 Anxiety disorder, unspecified: Secondary | ICD-10-CM

## 2020-04-24 DIAGNOSIS — D473 Essential (hemorrhagic) thrombocythemia: Secondary | ICD-10-CM

## 2020-04-24 DIAGNOSIS — D72829 Elevated white blood cell count, unspecified: Secondary | ICD-10-CM

## 2020-04-24 MED ORDER — ALPRAZOLAM 0.25 MG OR TABS
0.2500 mg | ORAL_TABLET | ORAL | 3 refills | Status: DC | PRN
Start: 2020-04-24 — End: 2020-06-26

## 2020-04-24 NOTE — Interdisciplinary (Signed)
New pt here for establish care.  MYepes LVN

## 2020-04-24 NOTE — Goals of Care (Deleted)
GOALS OF CARE / ADVANCE CARE PLANNING CONVERSATION NOTE    Advance Care Planning       What gives the patient's life meaning?  ***    Patient would be willing to endure aggressive medical therapies as long as they could still:   ***    Who would make medical decisions for the patient if they are unable to make decisions for themselves?   ***    Based on above information I recommended the following:  {ACP recommendation:19804}    Total time spent face-to-face with patient and/or surrogate decision maker providing counseling related to advance care planning:   *** minutes

## 2020-04-24 NOTE — Patient Instructions (Signed)
1) I will discuss with your dermatologist and review your labs with a hematologist.   2) Please obtain prior labs and drop off at office  3) We will call you to discuss further after review of your last labs

## 2020-04-26 ENCOUNTER — Ambulatory Visit
Admission: RE | Admit: 2020-04-26 | Discharge: 2020-04-26 | Disposition: A | Discharge status: Home / Self Care | Payer: No Typology Code available for payment source | Attending: Ob/Gyn | Admitting: Ob/Gyn

## 2020-04-26 ENCOUNTER — Ambulatory Visit: Payer: No Typology Code available for payment source | Admitting: Internal Medicine

## 2020-04-26 DIAGNOSIS — Z1231 Encounter for screening mammogram for malignant neoplasm of breast: Secondary | ICD-10-CM | POA: Insufficient documentation

## 2020-04-26 DIAGNOSIS — Z01419 Encounter for gynecological examination (general) (routine) without abnormal findings: Secondary | ICD-10-CM | POA: Insufficient documentation

## 2020-04-26 NOTE — Goals of Care (Signed)
GOALS OF CARE / ADVANCE CARE PLANNING CONVERSATION NOTE    Advance Care Planning       What gives the patient's life meaning?  ***    Patient would be willing to endure aggressive medical therapies as long as they could still:   ***    Who would make medical decisions for the patient if they are unable to make decisions for themselves?   ***    Based on above information I recommended the following:      Total time spent face-to-face with patient and/or surrogate decision maker providing counseling related to advance care planning:   *** minutes

## 2020-04-28 ENCOUNTER — Encounter: Payer: Self-pay | Admitting: Internal Medicine

## 2020-04-28 NOTE — Progress Notes (Signed)
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 Has been on IVIG. Previously on high dose steroids with elevated WBC then tapered since 07/2019. Recent ED visit labs in 03/12/2020 demonstrating continued leukocytosis and thrombocytosis.     Denies any new symptoms, at baseline level of health. Has history of anxiety, which she has been using alprazolam PRN.     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 10 tablet 3    clotrimazole (MYCELEX) 10 MG troche Take 1 tablet (10 mg) by mouth 5 times daily. 56 Troche 11     clotrimazole-betamethasone (LOTRISONE) 1-0.05 % cream Apply 1 Application topically 2 times daily. Use a small amount as directed to armpits 1 Tube 11    diphenhydrAMINE (BENADRYL) 50 MG/ML injection Please give 50 mg IV push prior to Rituxan infusions.  If needed may give a repeat 25 mg IV push dose. 1 vial 8    doxycycline (MONODOX) 100 MG capsule Take 1 capsule (100 mg) by mouth 2 times daily. 14 capsule 0    melatonin 3 MG capsule Take 1 capsule (3 mg) by mouth nightly. 30 capsule     Multiple Vitamin (MULTIVITAMIN) capsule Take 1 capsule by mouth daily. 30 tablet 0    Multiple Vitamins-Minerals (MULTIVITAMIN ADULT PO) multivitamin   daily      mycoPHENOLate mofetil (CELLCEPT) 500 MG tablet Take 2 tablets (1,000 mg) by mouth 2 times daily. 60 tablet 11    mycoPHENOLate mofetil (CELLCEPT) 500 MG tablet Take 2 tablets (1,000 mg) by mouth 2 times daily. Start this after you finish your rituxan infusion regimen (Patient taking differently: Take 500 mg by mouth daily. Start this after you finish your rituxan infusion regimen) 120 tablet 11    niacinamide 500 MG  tablet Take 1 tablet (500 mg) by mouth 3 times daily (with meals). 30 tablet 11    Niacinamide 500 MG TBCR Take 500 mg by mouth 3 times daily. 90 tablet 11    predniSONE (DELTASONE) 10 MG tablet TAKE 3 TABLETS BY MOUTH EVERY DAY 90 tablet 1    predniSONE (DELTASONE) 20 MG tablet 2 and a half tablet a day now      selenium sulfide (SELSUN) 2.5 % lotion Apply 1 Application topically daily. Apply daily for 15 min then rinse off for 1 week 1 bottle 0    valACYclovir (VALTREX) 500 MG tablet Take 1 tablet (500 mg) by mouth 2 times daily. Take 1 tablet twice a day for 10 days 20 tablet 0    vitamin D3 50 MCG (2000 UT) capsule by Oral route.       No current facility-administered medications for this visit.       Allergies: Patient has no known allergies.    Family History:   Family History   Problem Relation Name Age of Onset    Diabetes Mother           HTN, DJD    Neurological Disease (Parkinsons/Huntingtons/Huntingtons Chorea) Father          DM, alcoholism    Diabetes Father      No Known Problems Sister      Thyroid Brother      Cataract Maternal Grandfather      Cataract Paternal Grandfather         Social History:   Social History     Socioeconomic History    Marital status: Married     Spouse name: Not on file    Number of children: Not on file    Years of education: Not on file    Highest education level: Not on file   Occupational History    Not on file   Tobacco Use    Smoking status: Never Smoker    Smokeless tobacco: Never Used   Substance and Sexual Activity    Alcohol use: Not Currently    Drug use: Never    Sexual activity: Yes     Partners: Male   Social Activities of Daily Living Present    Not on file   Social History Narrative    Not on file        Review of Systems:     Per HPI, otherwise neg ROS     Objective:    BP 125/63 (BP Location: Left arm, BP Patient Position: Sitting, BP cuff size: Large)    Pulse 77    Temp 98.1 F (36.7 C) (Temporal)    Resp 16    Ht 5' (1.524 m)    Wt 93 kg (205 lb)    SpO2 98%    Breastfeeding No    BMI 40.04 kg/m  Body mass index is 40.04 kg/m.    General: Well-developed, well-nourished  NAD.  HEENT: PERRL, EOMI, no scleral icterus. MMM, OP clear, b/l external ears normal, Neck without obvious deformity on inspection  CVS: RRR, no m/r/g.   Resp: Normal respiratory effort without accessory muscle use, CTAB no w/r/r.  GI: +BS, soft, NTND  Ext: WWP, no LE edema.  Skin: Warm and dry. No rashes or lesions noted.  Neuro: Alert and interactive, moving all extremities spontaneously, steady gait.   Psych: Appropriately ranging mood and affect. Appropriate responses to questions.     Labs:  Results for orders placed  or performed during the hospital encounter of 05/19/19   Vitamin D, 25-OH Total Yellow serum separator tube   Result Value Ref Range    Vitamin D, 25-Hydroxy, Total 29.7 (L) >30.0 ng/mL    Glycosylated Hgb(A1C), Blood Lavender   Result Value Ref Range    Glycated Hgb, A1C 6.5 (H) 4.6 - 5.6 %   TSH, Blood Green Plasma Separator Tube   Result Value Ref Range    TSH, Ultrasensitive 2.222 0.450 - 4.120 uIU/mL   Lipid Panel Green Plasma Separator Tube   Result Value Ref Range    Cholesterol 190 <200 MG/DL    Triglycerides 224 (H) <150 MG/DL    HDL Cholesterol 48 >40 MG/DL    LDL Cholesterol (calc) 97 <160 MG/DL    VLDL Cholesterol (calculated) 45 MG/DL    Non HDL Cholesterol (calculated) 142 (H) <130 MG/DL   CBC w/ Diff Lavender   Result Value Ref Range    White Bld Cell Count 14.4 (H) 4.0 - 10.5 THOUS/MCL    RBC 5.04 (H) 3.70 - 5.00 MILL/MCL    Hgb 14.9 11.5 - 15.0 G/DL    Hematocrit 44.5 (H) 34.0 - 44.0 %    MCV 88.2 81.5 - 97.0 FL    MCH 29.5 27.0 - 33.5 PG    MCHC 33.5 32.0 - 35.5 G/DL    RDW-CV 16.0 (H) 11.6 - 14.4 %    PLT Count 297 150 - 400 THOUS/MCL    MPV 7.6 7.2 - 11.7 FL    Diff Type DIFFERENTIAL PERFORMED BY AUTOMATED ANALYSIS     Neutrophils % (A) 63.4 %    ANC automated 9.2 (H) 2.0 - 8.1 THOUS/MCL    Lymphocytes % 27.5 %    Lymphocytes Absolute 4.0 (H) 0.9 - 3.3 THOUS/MCL    Monocytes % 8.1 %    Monocytes Absolute 1.2 (H) 0.0 - 0.8 THOUS/MCL    Eosinophils % 0.3 %    Eosinophils Absolute 0.0 0.0 - 0.5 THOUS/MCL    Basophils % 0.7 %    Basophils Absolute 0.1 0.0 - 0.2 THOUS/MCL   Comprehensive Metabolic Panel Green Plasma Separator Tube   Result Value Ref Range    Sodium 134 (L) 136 - 145 mmol/L    Potassium 3.8 3.5 - 5.1 mmol/L    Chloride 98 98 - 107 mmol/L    CO2 30 21 - 31 mmol/L    Electrolyte Balance 6 2 - 12 mmol/L    Glucose 83 70 - 115 mg/dL    BUN 9 7 - 25 mg/dL    Creat 0.7 0.6 - 1.2 mg/dL    eGFR - low estimate >60 >59    eGFR - high estimate >60 >59    Calcium 9.2 8.6 - 10.3 mg/dL    Protein, Total 8.5 (H) 6.0 - 8.3 G/DL    Albumin 3.5 (L) 3.7 - 5.3 G/DL    Alk Phos 60 34 - 104 U/L    AST 20 13 - 39 U/L    ALT 22 7 - 52 U/L    Bilirubin, Total 0.7 0.0 - 1.4 mg/dL   Urinalysis  Microscopic Only, Random Urine   Result Value Ref Range    UR Sample Site, UA URINE,TYPE NOT SPECIFIED     RBC, UA NONE 0 - 3 #/HPF    WBC, UA <1 0 - 5 #/HPF    WBC Clumps, UA NONE NONE #/HPF    Bacteria, UA NONE NONE    Squamous Epithelial,  UA NONE 0 - 10 /HPF    Mucous, UA NONE NONE /LPF   Uric Acid, Blood Green Plasma Separator Tube   Result Value Ref Range    Uric Acid 4.5 2.3 - 6.6 MG/DL   SSA (RO)(ENA) Antibody, IgG   Result Value Ref Range    SSA (RO)(ENA) Ab, IgG 29 (H) 0 - 19 UNITS   SSB   Result Value Ref Range    SSB (LA)(ENA) Ab, IgG 5 0 - 19 UNITS       No results found for: HGBA1C  Lab Results   Component Value Date    CHOL 190 05/19/2019     Lab Results   Component Value Date    CREAT 0.7 05/19/2019    BUN 9 05/19/2019    K 3.8 05/19/2019    CL 98 05/19/2019    CO2 30 05/19/2019     Lab Results   Component Value Date    ALT 22 05/19/2019    AST 20 05/19/2019     No results found for: TSH    Radiology:  Screening Mammogram With Digital Breast Tomosynthesis - Bilateral    Result Date: 04/27/2020  SCREENING MAMMOGRAM WITH DIGITAL BREAST TOMOSYNTHESIS  CLINICAL HISTORY: Patient is 49 years old and is seen for screening. No personal history of breast cancer.  No family history of breast cancer. No family history of ovarian cancer.  IBIS estimated lifetime risk: 9.3%  COMPARISON: No prior imaging studies are available for comparison.  TECHNIQUE: Imaging was performed with 3D tomosynthesis and 2D digital mammography. Computer aided detection R2 version 9.3 was used for 2D digital images.  VIEWS OBTAINED: bilateral craniocaudal, bilateral craniocaudal 3D, bilateral mediolateral oblique, bilateral mediolateral oblique 3D, bilateral exaggerated craniocaudal, bilateral exaggerated craniocaudal 3D, bilateral MLO anterior compression, and bilateral MLO anterior compression 3D.  MAMMOGRAM FINDINGS: There are scattered areas of fibroglandular density.  There is no suspicious mass, calcification or architectural  distortion to suggest malignancy.       ACR BI-RADS Category 1 -  Negative.  RECOMMENDATION:  There is no mammographic evidence of malignancy.  A routine follow-up mammogram in 1 year is recommended unless clinically indicated otherwise. Patient information has been entered into a reminder system with a target due date of 1 year.   I have personally reviewed the images upon which this report is based and agree with the findings and conclusions expressed above.         Assessment & Plan:    #Pemphigus vulgaris  Currently on IVIG, well controlled sxs. Was previously on steroids that was tapered off in 2020.     #Leukocytosis:   #thrombocytosis:  Likely secondary. Denies any systemic sxs including  Vasomotor sxs, signs of thrombosis, b symptoms, signs of infection.   - CBC  - smear     #Anxiety:  - ALPRAZolam (XANAX) 0.25 MG tablet; Take 1 tablet (0.25 mg) by mouth as needed for Anxiety (2 hours prior to IV infusion).  Dispense: 10 tablet; Refill: 3      #. Routine Health Maintenance:  Health Maintenance   Topic Date Due    PHQ2 Depression Screen ([place])  Never done    Annual AUDIT Screen  Never done    Annual DAST Screen  Never done    HIV Screening  Never done    Tetanus (1 - Tdap) Never done    Influenza (1) 03/04/2020    Breast Cancer Screen  04/26/2021    Cervical Cancer Screening  12/27/2021  COVID-19 Vaccine  Completed    Polio Vaccine  Aged Out    HPV Vaccine <= 36 Yrs  Aged Out    Meningococcal MCV4 Vaccine  Aged Out    Pneumococcal Vaccine  Aged Out       The above plan of care, diagnosis, orders, and follow-up were discussed with the patient.  Questions related to this recommended plan of care were answered.    Advised to return to clinic in 6 months or sooner PRN.    No future appointments.    Boyce Medici, MD 04/28/2020 1:09 PM

## 2020-05-02 ENCOUNTER — Encounter: Payer: Self-pay | Admitting: Internal Medicine

## 2020-05-02 DIAGNOSIS — D72829 Elevated white blood cell count, unspecified: Secondary | ICD-10-CM

## 2020-05-08 ENCOUNTER — Telehealth: Payer: Self-pay | Admitting: Internal Medicine

## 2020-05-08 NOTE — Telephone Encounter (Signed)
Patient urgently requests call for status of reducing dose of IVIG.    States MD to discuss with [facility] Dr Charm Barges.      Okay to leave a voice message.  Thank you.

## 2020-05-09 ENCOUNTER — Other Ambulatory Visit (HOSPITAL_BASED_OUTPATIENT_CLINIC_OR_DEPARTMENT_OTHER): Payer: No Typology Code available for payment source | Admitting: Internal Medicine

## 2020-05-09 DIAGNOSIS — D72829 Elevated white blood cell count, unspecified: Secondary | ICD-10-CM

## 2020-05-09 NOTE — Progress Notes (Signed)
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 Previously on high dose steroids with elevated WBC up to 14 then tapered off since 07/2019. Recent ED visit labs in 03/12/2020 demonstrating continued leukocytosis to 14 (w elevation in neutrophils, lymphs, and monocytes) and thrombocytosis (Labs in [place]). No localizing sxs to suggest infection. Reported subjective fever in august. No night sweats or weight loss. At this time would you continue to monitor or further evaluate, and if so what is recommended in the evaluation.        WBC 14.4* (10/15) HGB 14.9 (10/15) PLT 297 (10/15)    HCT 44.5* (10/15)        Na 134* (10/15) CL 98 (10/15) BUN 9 (10/15) GLU   83 (10/15)   K 3.8 (10/15) CO2 30 (10/15) Cr 0.7 (10/15)      TP 8.5* (10/15) AST 20 (10/15) TBILI 0.7 (10/15) ALK PHOS  60 (10/15)   ALB 3.5* (10/15) ALT 22 (10/15) DBILI        %Neutrophils   %Bands   %Lymphs   %Monos   %Eos   %Basos      Results for Anita Turner (MRN [id number]) as of 05/09/2020 21:08   Ref. Range 05/19/2019 09:43   Neutrophils % (A) Latest Units: % 63.4   ANC automated Latest Ref Range: 2.0 - 8.1 THOUS/MCL 9.2 (H)   Lymphocytes % Latest Units: % 27.5   Lymphocytes Absolute Latest Ref Range: 0.9 - 3.3 THOUS/MCL 4.0 (H)   Monocytes Latest Ref Range: 0.0 - 0.8 THOUS/MCL 1.2 (H)   Monocytes % Latest Units: % 8.1   Eosinophils % Latest Units: % 0.3   Eosinophils Absolute Latest Ref Range: 0.0 - 0.5 THOUS/MCL 0.0   Basophils % Latest Units: % 0.7       03/12/2020 (done during ED visit for possible viral illness)  14.6 > 15.3/45.5 < 447  MCV 84  57.8% PMNs  32.7% lymphs     Current Outpatient Medications on File Prior to Visit   Medication Sig Dispense Refill    ALPRAZolam (XANAX) 0.25 MG tablet Take 1 tablet (0.25 mg) by mouth as needed for Anxiety (2 hours prior to IV infusion). 10 tablet 3    clotrimazole (MYCELEX) 10 MG troche Take 1 tablet (10 mg) by mouth 5 times daily. 56 Troche 11     clotrimazole-betamethasone (LOTRISONE) 1-0.05 % cream Apply 1 Application topically 2 times daily. Use a small amount as directed to armpits 1 Tube 11    diphenhydrAMINE (BENADRYL) 50 MG/ML injection Please give 50 mg IV push prior to Rituxan infusions.  If needed may give a repeat 25 mg IV push dose. 1 vial 8    doxycycline (MONODOX) 100 MG capsule Take 1 capsule (100 mg) by mouth 2 times daily. 14 capsule 0    melatonin 3 MG capsule Take 1 capsule (3 mg) by mouth nightly. 30 capsule     Multiple Vitamin (MULTIVITAMIN) capsule Take 1 capsule by mouth daily. 30 tablet 0    mycoPHENOLate mofetil (CELLCEPT) 500 MG tablet Take 2 tablets (1,000 mg) by mouth 2 times daily. 60 tablet 11    mycoPHENOLate mofetil (CELLCEPT) 500 MG tablet Take 2 tablets (1,000 mg) by mouth 2 times daily. Start this after you finish your rituxan infusion regimen (Patient taking differently: Take 500 mg by mouth daily. Start this after you finish your rituxan infusion regimen) 120 tablet 11    niacinamide 500  MG tablet Take 1 tablet (500 mg) by mouth 3 times daily (with meals). 30 tablet 11    selenium sulfide (SELSUN) 2.5 % lotion Apply 1 Application topically daily. Apply daily for 15 min then rinse off for 1 week 1 bottle 0    valACYclovir (VALTREX) 500 MG tablet Take 1 tablet (500 mg) by mouth 2 times daily. Take 1 tablet twice a day for 10 days 20 tablet 0    vitamin D3 50 MCG (2000 UT) capsule by Oral route.       No current facility-administered medications on file prior to visit.     Agree, at this point not on any meds that are likely to be causing leukocytosis. Of note, diff normal.     I would just start by re-checking CBC with diff as we onlt have 1 data point off steroids and that was in the ED. Certainly could have some chronic inflammation causing mild leukocytosis and thrombocytosis. Iron deficiency seems like unlikely etiology of thrombocytosis given normal Hgb.

## 2020-05-09 NOTE — Telephone Encounter (Signed)
Called and s/w pt, relayed Dr. Lamont Snowball  Message.  Pt verbalized understanding.  MYepes LVN

## 2020-05-21 ENCOUNTER — Encounter: Payer: Self-pay | Admitting: Internal Medicine

## 2020-05-21 ENCOUNTER — Other Ambulatory Visit: Payer: Self-pay | Admitting: Internal Medicine

## 2020-05-21 ENCOUNTER — Ambulatory Visit: Payer: No Typology Code available for payment source | Admitting: Internal Medicine

## 2020-05-21 VITALS — BP 112/71 | HR 82 | Temp 97.9°F | Resp 16 | Ht 60.0 in | Wt 205.2 lb

## 2020-05-21 DIAGNOSIS — Z6841 Body Mass Index (BMI) 40.0 and over, adult: Secondary | ICD-10-CM

## 2020-05-21 DIAGNOSIS — G4700 Insomnia, unspecified: Secondary | ICD-10-CM

## 2020-05-21 DIAGNOSIS — F341 Dysthymic disorder: Secondary | ICD-10-CM

## 2020-05-21 MED ORDER — BUPROPION XL (DAILY) 150 MG OR TB24
150.0000 mg | ORAL_TABLET | Freq: Every morning | ORAL | 3 refills | Status: DC
Start: 2020-05-21 — End: 2020-08-17

## 2020-05-21 MED ORDER — WEGOVY 1 MG/0.5ML SC SOAJ
1.0000 mg | SUBCUTANEOUS | 0 refills | Status: DC
Start: 2020-07-16 — End: 2020-06-26

## 2020-05-21 MED ORDER — WEGOVY 2.4 MG/0.75ML SC SOAJ
2.4000 mg | SUBCUTANEOUS | 2 refills | Status: DC
Start: 2020-09-10 — End: 2020-06-26

## 2020-05-21 MED ORDER — WEGOVY 0.25 MG/0.5ML SC SOAJ
0.2500 mg | SUBCUTANEOUS | 0 refills | Status: AC
Start: 2020-05-21 — End: 2020-06-18

## 2020-05-21 MED ORDER — RAMELTEON 8 MG OR TABS
8.0000 mg | ORAL_TABLET | Freq: Every evening | ORAL | 0 refills | Status: DC
Start: 2020-05-21 — End: 2020-06-26

## 2020-05-21 MED ORDER — WEGOVY 0.5 MG/0.5ML SC SOAJ
0.5000 mg | SUBCUTANEOUS | 0 refills | Status: DC
Start: 2020-06-18 — End: 2020-06-26

## 2020-05-21 MED ORDER — WEGOVY 1.7 MG/0.75ML SC SOAJ
1.7000 mg | SUBCUTANEOUS | 0 refills | Status: DC
Start: 2020-08-13 — End: 2020-06-26

## 2020-05-21 NOTE — Goals of Care (Deleted)
Advance Care Planning   GOALS OF CARE / ADVANCE CARE PLANNING CONVERSATION NOTE    Advance Care Planning       What gives the patient's life meaning?  ***    Patient would be willing to endure aggressive medical therapies as long as they could still:   ***    Who would make medical decisions for the patient if they are unable to make decisions for themselves?   ***    Based on above information I recommended the following:  {ACP recommendation:19804}    Total time spent face-to-face with patient and/or surrogate decision maker providing counseling related to advance care planning:   *** minutes

## 2020-05-21 NOTE — Interdisciplinary (Signed)
Patient is here today c/o of insomnia.

## 2020-05-21 NOTE — Progress Notes (Signed)
[facility] Primary Care History and Physical    PATIENT:  Anita Turner  MRN:  [id number]  DOB:  1971-06-22  DATE OF SERVICE:  04/28/2020    PRIMARY CARE PROVIDER: Boyce Medici    Chief Complaint   Patient presents with    Sleep Problem        Subjective:    Anita Turner is a 49 year old female with hx of pemphigus vulgaris on IVIG, anxiety who presents for f/u    Pt reports depressed mood. Recently moved and does not have any friends in the area. She has protective factors of her family including her husband and daughter. Denies SI HI. She reports insomnia, has tried multiple sleep medications prior now using Melatonin and tea, unable to maintain sleep. Usually does not have issues with sleep initiation. Husband reports snoring     Pemphigus vulgaris: reports sxs well controlled. Has been on IVIG. Previously on high dose steroids with elevated WBC then tapered since 07/2019. Recent ED visit labs in 03/12/2020 demonstrating continued leukocytosis and thrombocytosis.     Denies any new symptoms, at baseline level of health. Has history of anxiety, which she has been using alprazolam PRN.     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 10 tablet 3    clotrimazole (MYCELEX) 10 MG troche Take 1 tablet (10 mg) by mouth 5 times daily. 56 Troche 11    clotrimazole-betamethasone (LOTRISONE) 1-0.05 % cream Apply 1 Application topically 2 times daily. Use a small amount as directed to armpits 1 Tube 11    diphenhydrAMINE (BENADRYL) 50 MG/ML injection Please give 50 mg IV push prior to Rituxan infusions.  If needed may give a repeat 25 mg IV push dose. 1 vial 8    doxycycline (MONODOX) 100 MG capsule Take 1 capsule (100 mg) by mouth 2 times daily. 14 capsule 0    melatonin 3 MG capsule Take 1 capsule (3 mg) by mouth nightly. 30 capsule     Multiple Vitamin (MULTIVITAMIN) capsule Take 1 capsule by mouth daily. 30 tablet 0    mycoPHENOLate mofetil (CELLCEPT) 500 MG tablet Take 2 tablets (1,000 mg) by mouth 2 times daily. 60 tablet 11    mycoPHENOLate mofetil (CELLCEPT) 500 MG tablet Take 2 tablets (1,000  mg) by mouth 2 times daily. Start this after you finish your rituxan infusion regimen (Patient taking differently: Take 500 mg by mouth daily. Start this after you finish your rituxan infusion regimen) 120 tablet 11    niacinamide 500 MG tablet Take 1 tablet (500 mg) by mouth 3 times daily (with meals). 30 tablet 11    selenium sulfide (SELSUN) 2.5 % lotion Apply 1 Application topically daily. Apply daily for 15 min then rinse off for 1 week 1 bottle 0    valACYclovir (VALTREX) 500 MG tablet Take 1 tablet (500 mg) by mouth 2 times daily. Take 1 tablet twice a day for 10 days 20 tablet 0    vitamin D3 50 MCG (2000 UT) capsule by Oral route.       No current facility-administered medications for this visit.       Allergies: Patient has no known allergies.    Family History:   Family History   Problem Relation Name Age of Onset     Diabetes Mother          HTN, DJD    Neurological Disease (Parkinsons/Huntingtons/Huntingtons Chorea) Father          DM, alcoholism    Diabetes Father      No Known Problems Sister      Thyroid Brother      Cataract Maternal Grandfather      Cataract Paternal Grandfather         Social History:   Social History     Socioeconomic History    Marital status: Married     Spouse name: Not on file    Number of children: Not on file    Years of education: Not on file    Highest education level: Not on file   Occupational History    Not on file   Tobacco Use    Smoking status: Never Smoker    Smokeless tobacco: Never Used   Substance and Sexual Activity    Alcohol use: Not Currently    Drug use: Never    Sexual activity: Yes     Partners: Male   Social Activities of Daily Living Present    Not on file   Social History Narrative    Not on file        Review of Systems:     Per HPI, otherwise neg ROS     Objective:    BP 112/71    Pulse 82    Temp 97.9 F (36.6 C) (Temporal)    Resp 16    Ht 5' (1.524 m)    Wt 93.1 kg (205 lb 4 oz)    SpO2 99%    BMI 40.09 kg/m  Body mass index is 40.09 kg/m.    General: Well-developed, well-nourished  NAD.  HEENT: PERRL, EOMI, no scleral icterus. MMM, OP clear, b/l external ears normal, Neck without obvious deformity on inspection  CVS: RRR, no m/r/g.   Resp: Normal respiratory effort without accessory muscle use, CTAB no w/r/r.  GI: +BS, soft, NTND  Ext: WWP, no LE edema.  Skin: Warm and dry. No rashes or lesions noted.  Neuro: Alert and interactive, moving all extremities spontaneously, steady gait.   Psych: Appropriately ranging mood and affect. Appropriate responses to questions.     Labs:  Results for orders placed or performed during the hospital encounter of 05/19/19   Vitamin D, 25-OH Total Yellow serum separator tube   Result Value Ref Range  Vitamin D, 25-Hydroxy, Total 29.7 (L) >30.0 ng/mL   Glycosylated Hgb(A1C), Blood Lavender   Result Value Ref Range     Glycated Hgb, A1C 6.5 (H) 4.6 - 5.6 %   TSH, Blood Green Plasma Separator Tube   Result Value Ref Range    TSH, Ultrasensitive 2.222 0.450 - 4.120 uIU/mL   Lipid Panel Green Plasma Separator Tube   Result Value Ref Range    Cholesterol 190 <200 MG/DL    Triglycerides 224 (H) <150 MG/DL    HDL Cholesterol 48 >40 MG/DL    LDL Cholesterol (calc) 97 <160 MG/DL    VLDL Cholesterol (calculated) 45 MG/DL    Non HDL Cholesterol (calculated) 142 (H) <130 MG/DL   CBC w/ Diff Lavender   Result Value Ref Range    White Bld Cell Count 14.4 (H) 4.0 - 10.5 THOUS/MCL    RBC 5.04 (H) 3.70 - 5.00 MILL/MCL    Hgb 14.9 11.5 - 15.0 G/DL    Hematocrit 44.5 (H) 34.0 - 44.0 %    MCV 88.2 81.5 - 97.0 FL    MCH 29.5 27.0 - 33.5 PG    MCHC 33.5 32.0 - 35.5 G/DL    RDW-CV 16.0 (H) 11.6 - 14.4 %    PLT Count 297 150 - 400 THOUS/MCL    MPV 7.6 7.2 - 11.7 FL    Diff Type DIFFERENTIAL PERFORMED BY AUTOMATED ANALYSIS     Neutrophils % (A) 63.4 %    ANC automated 9.2 (H) 2.0 - 8.1 THOUS/MCL    Lymphocytes % 27.5 %    Lymphocytes Absolute 4.0 (H) 0.9 - 3.3 THOUS/MCL    Monocytes % 8.1 %    Monocytes Absolute 1.2 (H) 0.0 - 0.8 THOUS/MCL    Eosinophils % 0.3 %    Eosinophils Absolute 0.0 0.0 - 0.5 THOUS/MCL    Basophils % 0.7 %    Basophils Absolute 0.1 0.0 - 0.2 THOUS/MCL   Comprehensive Metabolic Panel Green Plasma Separator Tube   Result Value Ref Range    Sodium 134 (L) 136 - 145 mmol/L    Potassium 3.8 3.5 - 5.1 mmol/L    Chloride 98 98 - 107 mmol/L    CO2 30 21 - 31 mmol/L    Electrolyte Balance 6 2 - 12 mmol/L    Glucose 83 70 - 115 mg/dL    BUN 9 7 - 25 mg/dL    Creat 0.7 0.6 - 1.2 mg/dL    eGFR - low estimate >60 >59    eGFR - high estimate >60 >59    Calcium 9.2 8.6 - 10.3 mg/dL    Protein, Total 8.5 (H) 6.0 - 8.3 G/DL    Albumin 3.5 (L) 3.7 - 5.3 G/DL    Alk Phos 60 34 - 104 U/L    AST 20 13 - 39 U/L    ALT 22 7 - 52 U/L    Bilirubin, Total 0.7 0.0 - 1.4 mg/dL   Urinalysis Microscopic Only, Random Urine   Result Value Ref Range    UR Sample  Site, UA URINE,TYPE NOT SPECIFIED     RBC, UA NONE 0 - 3 #/HPF    WBC, UA <1 0 - 5 #/HPF    WBC Clumps, UA NONE NONE #/HPF    Bacteria, UA NONE NONE    Squamous Epithelial, UA NONE 0 - 10 /HPF    Mucous, UA NONE NONE /LPF   Uric Acid, Blood Green Plasma Separator Tube   Result Value  Ref Range    Uric Acid 4.5 2.3 - 6.6 MG/DL   SSA (RO)(ENA) Antibody, IgG   Result Value Ref Range    SSA (RO)(ENA) Ab, IgG 29 (H) 0 - 19 UNITS   SSB   Result Value Ref Range    SSB (LA)(ENA) Ab, IgG 5 0 - 19 UNITS       No results found for: HGBA1C  Lab Results   Component Value Date    CHOL 190 05/19/2019     Lab Results   Component Value Date    CREAT 0.7 05/19/2019    BUN 9 05/19/2019    K 3.8 05/19/2019    CL 98 05/19/2019    CO2 30 05/19/2019     Lab Results   Component Value Date    ALT 22 05/19/2019    AST 20 05/19/2019     No results found for: TSH    Radiology:  Screening Mammogram With Digital Breast Tomosynthesis - Bilateral    Result Date: 04/27/2020  SCREENING MAMMOGRAM WITH DIGITAL BREAST TOMOSYNTHESIS  CLINICAL HISTORY: Patient is 49 years old and is seen for screening. No personal history of breast cancer.  No family history of breast cancer. No family history of ovarian cancer.  IBIS estimated lifetime risk: 9.3%  COMPARISON: No prior imaging studies are available for comparison.  TECHNIQUE: Imaging was performed with 3D tomosynthesis and 2D digital mammography. Computer aided detection R2 version 9.3 was used for 2D digital images.  VIEWS OBTAINED: bilateral craniocaudal, bilateral craniocaudal 3D, bilateral mediolateral oblique, bilateral mediolateral oblique 3D, bilateral exaggerated craniocaudal, bilateral exaggerated craniocaudal 3D, bilateral MLO anterior compression, and bilateral MLO anterior compression 3D.  MAMMOGRAM FINDINGS: There are scattered areas of fibroglandular density.  There is no suspicious mass, calcification or architectural distortion to suggest malignancy.       ACR BI-RADS Category 1 -   Negative.  RECOMMENDATION:  There is no mammographic evidence of malignancy.  A routine follow-up mammogram in 1 year is recommended unless clinically indicated otherwise. Patient information has been entered into a reminder system with a target due date of 1 year.   I have personally reviewed the images upon which this report is based and agree with the findings and conclusions expressed above.         Assessment & Plan:    1. Insomnia, unspecified type  - Consult/Referral to Sleep Medicine; Future  - trial ramalteon     2. Dysthymic disorder  - Consult/Referral to Psychology    #Pemphigus vulgaris  Currently on IVIG, well controlled sxs. Was previously on steroids that was tapered off in 2020.     #Leukocytosis:   #thrombocytosis:  Likely secondary. Denies any systemic sxs including  Vasomotor sxs, signs of thrombosis, b symptoms, signs of infection.   - CBC  - smear     #BMI: 40, w hx of DM. Would likely benefit fr glp1 agonist         #. Routine Health Maintenance:  Health Maintenance   Topic Date Due    Pneumococcal Vaccine (1 of 4 - PCV13) Never done    Hepatitis C Screening  Never done    HIV Screening  Never done    Tetanus (1 - Tdap) Never done    PHQ9 Depression Monitoring ([place])  08/21/2020    Annual AUDIT Screen  11/19/2020    Annual DAST Screen  11/19/2020    Breast Cancer Screen  04/26/2021    Cervical Cancer Screening  12/27/2021  Influenza  Completed    COVID-19 Vaccine  Completed    Polio Vaccine  Aged Out    HPV Vaccine <= 63 Yrs  Aged Out    Meningococcal MCV4 Vaccine  Aged Out       The above plan of care, diagnosis, orders, and follow-up were discussed with the patient.  Questions related to this recommended plan of care were answered.    Advised to return to clinic in 3 months or sooner PRN.    No future appointments.    Boyce Medici, MD 04/28/2020 1:09 PM

## 2020-05-21 NOTE — Patient Instructions (Addendum)
1) For sleep, please schedule sleep study and you can start ramelteon as needed for sleep. If you choose to take ramelteon for the night, do not use melatonin during that night.   2) Please start wellbutrin and schedule with psychology for therapy   3) Please start semaglutide for weight loss. Follow-up in 4 weeks

## 2020-05-22 ENCOUNTER — Telehealth: Payer: Self-pay | Admitting: Psychiatry

## 2020-05-22 ENCOUNTER — Encounter: Payer: Self-pay | Admitting: Internal Medicine

## 2020-05-22 ENCOUNTER — Telehealth: Payer: Self-pay | Admitting: Internal Medicine

## 2020-05-22 DIAGNOSIS — G4700 Insomnia, unspecified: Secondary | ICD-10-CM

## 2020-05-22 NOTE — Telephone Encounter (Signed)
Prior [place] Submitted  [place]  [phone]  KEY Steve Rattler

## 2020-05-22 NOTE — Telephone Encounter (Signed)
Pt needs to talk to the nurse:  1- needs ref to sleep study not for consultation.  2- needs auth for 2 meds: Ramelteon, Wegovy.    Please call pt back to confirm. Thank you

## 2020-05-22 NOTE — Telephone Encounter (Signed)
Patient has been called and advised.

## 2020-05-22 NOTE — Telephone Encounter (Signed)
I reviewed the documentation. We will need additional information and documentation to justify a sleep study. Please schedule a sleep clinic visit.

## 2020-05-22 NOTE — Telephone Encounter (Signed)
Fax received stating patients prescription for Ramelteon 8 MG Tablet has been denied.  Insurance wants patient to try Eszopidone, Zolpidem, or Zaleplon first.  Please advise if you would like to change or prior auth.    Thank you.

## 2020-05-22 NOTE — Telephone Encounter (Signed)
Patient was referred by NGUYEN, [facility] for sleep consultation, but she believes there was an error (and should be for sleep study). Pt will speak to provider to request re-submission.    Would you be able to please triage and see if PSG would be appropriate first? Thank you!

## 2020-05-23 ENCOUNTER — Encounter: Payer: Self-pay | Admitting: Internal Medicine

## 2020-05-28 ENCOUNTER — Encounter: Payer: Self-pay | Admitting: Internal Medicine

## 2020-06-01 ENCOUNTER — Encounter: Payer: Self-pay | Admitting: Internal Medicine

## 2020-06-01 NOTE — Telephone Encounter (Signed)
Advised patient.  Copy of referral sent to patient through [place].

## 2020-06-09 ENCOUNTER — Encounter: Payer: Self-pay | Admitting: Internal Medicine

## 2020-06-09 ENCOUNTER — Encounter: Payer: Self-pay | Admitting: Family Practice

## 2020-06-09 DIAGNOSIS — Z1211 Encounter for screening for malignant neoplasm of colon: Secondary | ICD-10-CM

## 2020-06-18 ENCOUNTER — Encounter: Payer: Self-pay | Admitting: Internal Medicine

## 2020-06-18 ENCOUNTER — Encounter: Payer: Self-pay | Admitting: Dermatology

## 2020-06-18 DIAGNOSIS — Z79899 Other long term (current) drug therapy: Secondary | ICD-10-CM

## 2020-06-18 DIAGNOSIS — L100 Pemphigus vulgaris: Secondary | ICD-10-CM

## 2020-06-20 ENCOUNTER — Encounter: Payer: Self-pay | Admitting: Dermatology

## 2020-06-21 ENCOUNTER — Other Ambulatory Visit
Admission: RE | Admit: 2020-06-21 | Discharge: 2020-06-21 | Disposition: A | Discharge status: Home / Self Care | Payer: No Typology Code available for payment source | Source: Ambulatory Visit | Attending: Dermatology | Admitting: Dermatology

## 2020-06-21 DIAGNOSIS — Z79899 Other long term (current) drug therapy: Secondary | ICD-10-CM

## 2020-06-21 LAB — CBC WITH DIFF, BLOOD
ANC automated: 6.5 10*3/uL (ref 2.0–8.1)
Basophils %: 0.7 %
Basophils Absolute: 0.1 10*3/uL (ref 0.0–0.2)
Eosinophils %: 1.9 %
Eosinophils Absolute: 0.2 10*3/uL (ref 0.0–0.5)
Hematocrit: 42.2 % (ref 34.0–44.0)
Hgb: 14.2 G/DL (ref 11.5–15.0)
Lymphocytes %: 31.4 %
Lymphocytes Absolute: 3.5 10*3/uL — ABNORMAL HIGH (ref 0.9–3.3)
MCH: 28.8 PG (ref 27.0–33.5)
MCHC: 33.8 G/DL (ref 32.0–35.5)
MCV: 85.4 FL (ref 81.5–97.0)
MPV: 8.5 FL (ref 7.2–11.7)
Monocytes %: 7 %
Monocytes Absolute: 0.8 10*3/uL (ref 0.0–0.8)
Neutrophils % (A): 59 %
PLT Count: 403 10*3/uL — ABNORMAL HIGH (ref 150–400)
RBC: 4.94 10*6/uL (ref 3.70–5.00)
RDW-CV: 13.9 % (ref 11.6–14.4)
White Bld Cell Count: 11.1 10*3/uL — ABNORMAL HIGH (ref 4.0–10.5)

## 2020-06-21 LAB — COMPREHENSIVE METABOLIC PANEL, BLOOD
ALT: 14 U/L (ref 7–52)
AST: 20 U/L (ref 13–39)
Albumin: 3.7 G/DL (ref 3.7–5.3)
Alk Phos: 90 U/L (ref 34–104)
BUN: 8 mg/dL (ref 7–25)
Bilirubin, Total: 0.4 mg/dL (ref 0.0–1.4)
CO2: 27 mmol/L (ref 21–31)
Calcium: 9.4 mg/dL (ref 8.6–10.3)
Chloride: 100 mmol/L (ref 98–107)
Creat: 0.7 mg/dL (ref 0.6–1.2)
Electrolyte Balance: 7 mmol/L (ref 2–12)
Glucose: 92 mg/dL (ref 70–115)
Potassium: 4.2 mmol/L (ref 3.5–5.1)
Protein, Total: 8.4 G/DL — ABNORMAL HIGH (ref 6.0–8.3)
Sodium: 134 mmol/L — ABNORMAL LOW (ref 136–145)
eGFR - high estimate: >60 (ref >59)
eGFR - low estimate: >60 (ref >59)

## 2020-06-26 ENCOUNTER — Ambulatory Visit: Payer: No Typology Code available for payment source | Admitting: Dermatology

## 2020-06-26 VITALS — BP 133/72 | HR 99

## 2020-06-26 DIAGNOSIS — Z79899 Other long term (current) drug therapy: Secondary | ICD-10-CM

## 2020-06-26 DIAGNOSIS — L918 Other hypertrophic disorders of the skin: Secondary | ICD-10-CM

## 2020-06-26 DIAGNOSIS — F419 Anxiety disorder, unspecified: Secondary | ICD-10-CM

## 2020-06-26 DIAGNOSIS — L100 Pemphigus vulgaris: Secondary | ICD-10-CM

## 2020-06-26 MED ORDER — ALPRAZOLAM 0.25 MG OR TABS
0.2500 mg | ORAL_TABLET | ORAL | 1 refills | Status: DC | PRN
Start: 2020-06-26 — End: 2020-09-14

## 2020-06-26 MED ORDER — DOXYCYCLINE MONOHYDRATE 100 MG OR CAPS
100.0000 mg | ORAL_CAPSULE | Freq: Two times a day (BID) | ORAL | 11 refills | Status: AC
Start: 2020-06-26 — End: 2020-07-26

## 2020-06-26 NOTE — Progress Notes (Signed)
Marland KitchenChuck Hint, MD PhD  Professor of Dermatology  [place], [place], [place] [address]  [address], [place] [address]  [email or web address]  Phone: [phone] Fax: [phone]  [facility] Department of Dermatology    Date of Service:  06/26/2020    CHIEF COMPLAINT:   Chief Complaint   Patient presents with   . Follow Up     PV       HPI:   Anita Turner is a 49 year old female who presents today for pemphigus vulgaris f/u.     LV 04/02/20. Remains on IVIg monthly (last infusion first week of November) with cellcept, doxycycline, niacinamide, calicum, and multivitamin. She feels fatigued and weak for IVIg. She denies any fatigue. She notes that some of the skin tags are itchy on her neck.    Per chart review:  - Had treatment regimen paused after an insurance-related gap in care.      Per initial HPI:  Pt notes she was diagnosed with pemphigus vulgaris and pemphigus foliacious. Has been off prednisone in June 2019. Off of Cellcept in March. Recently saw a physician in [facility] and was recommended Rituxan. Took one dose and was put on hold because of COVID. Pt notes that with induction of rituxan became very itchy. Was not alleviated with benadryl. Saw Dr. Trevor Mace in [facility] (Dr. Janace Litten previous resident).   Developed oral lesions ~early July 2020. Initially tongue involvement and then started involving gums and lips. Used magic mouthwash, TAC dental paste for flares. Prior flares would only last 3-4 days, but this time it's longer.   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    Social History:   reports that she has never smoked. She has never used smokeless tobacco. She reports previous alcohol use. She reports that she does not use drugs.    Family History:  family history includes Cataract in her maternal grandfather and paternal grandfather; Diabetes in her father and mother; Neurological Disease (Parkinsons/Huntingtons/Huntingtons Chorea) in her father; No Known Problems in her sister; Thyroid in her brother.    Home Medications:  has a current medication list which includes the following prescription(s): alprazolam, bupropion, clotrimazole, clotrimazole-betamethasone, diphenhydramine, doxycycline, multivitamin, mycophenolate mofetil, mycophenolate mofetil, niacinamide, ramelteon, selenium sulfide, wegovy, [START ON 07/16/2020] wegovy, [START ON 08/13/2020] wegovy, [START ON 09/10/2020] wegovy, valacyclovir, and vitamin d3.    Allergies:   Patient has no known allergies.      PE:  GENERAL: Well developed, well nourished, in no acute distress. A/O x 3.    Skin Exam: Skin type: IV  Full body skin exam performed including scalp, face, conjunctiva, lips, oral mucosa, neck, right upper, left upper, hands, nails, and the following was noted:    - no oral lesions today  - no blisters/erosions today  -  Soft, pedunculated, skin colored papules on neck folds    Labs/Imaging:   06/21/20  CBC with WBC 11.1, CMP otherwise unremarkable    02/07/2019   Basement Membrane Zone (BMZ) IgG, IgG4, and IgA  IgG: Negative, monkey esophagus substrate     Negative, human split skin substrate      IgG4: Negative, monkey esophagus substrate        Negative, human  split skin substrate   IgA: Negative, monkey esophagus substrate     Negative, human split skin substrate   Positive IgG, including IgG4, cell surface antibodies,         monkey esophagus substrate     Enzyme Linked Immunosorbent Assay (ELISA)   --------------------------------------------------   Bullous Pemphigoid (BP) 180 and 230 IgG Antibodies   IgG BP 180 antibodies: 1 unit  NEGATIVE  IgG BP 230 antibodies: 1 unit  NEGATIVE    02/07/2019  Cell Surface IgG Antibodies   IgG: Positive, titer 1:2560 (H), monkey esophagus substrate     Positive, titer 1:640 (H), intact human skin substrate     Enzyme-Linked Immunosorbent Assay (ELISA)  -----------------------------------------  Desmoglein (DSG) 1 and 3 IgG Antibodies   IgG desmoglein 1 antibodies:  7 units  NEGATIVE    Reference Range:      Positive (H) = Greater than 20 units      Borderline/Indeterminate = 14-20 units      Negative = Less than 14 units   IgG desmoglein 3 antibodies: 320 units (H)  POSITIVE     (Initial level, 139 units, greater than high         calibrator; diluted to achieve 64 units, within      assay calibrators, and multiplied by the dilution      factor of 5)   COMMENTS   Specific   -------------------------------------------------   These indirect immunofluorescence results, demonstrating   positive IgG cell surface antibody reactivity on both   monkey esophagus and intact human skin substrates, support   the diagnosis of pemphigus.      Assessment & Plan:   # Pemphigus Vulgaris -- WELL CONTROLLED  # High risk medication monitoring  In remission, has no current oral ulcers. Previously on prednisone, max dose 80mg  daily, tapered off in 07/2019.   - Continue IVIG monthly for 3 months, then taper  - Continue Cellcept to 1000mg  BID   - CBC with diff and CMP q6-8 weeks (reviewed from 8/9)  -- RE-START Doxycycline 100mg  BID  -- CONTINUE Niacinamide 500mg  TID  -- CONTINUE Calcium 1g supplement daily  (can be acquired over the counter)  -- CONTINUE Multivitamin 2 tabs daily (patient already taking multivitamin)    #Anxiety  - alprazolam 0.25mg  po PRN, refilled per patient request    # Acrochordon, Inflamed  - Discussed diagnosis with patient.  - Counseled patient to return to clinic sooner for any new or changing skin lesions or skin concerns.    Patient given the opportunity to ask questions.  Side effects of medications have been discussed.  Sun precautions discussed and emphasized with the patient.    Return to Clinic 8 weeks or sooner prn     The patient was seen and examined with the attending physician below, who agrees with the above assessment and plan.    Park Meo, MD, PhD  Resident Physician, PGY-4  [employer], [facility]  Department of Dermatology    Chuck Hint, MD, PhD  Attending [place], [facility]  Department  of Dermatology

## 2020-06-26 NOTE — Patient Instructions (Signed)
-   Continue IVIG monthly for 6 months, then taper  - Continue Cellcept to 1000mg  BID   - CBC with diff and CMP q6-8 weeks (reviewed from 8/9)  -- RE-START Doxycycline 100mg  BID  -- CONTINUE Niacinamide 500mg  TID  -- CONTINUE Calcium 1g supplement daily (can be acquired over the counter)  -- CONTINUE Multivitamin 2 tabs daily (patient already taking multivitamin)

## 2020-06-29 NOTE — Addendum Note (Signed)
Addended by: Chuck Hint A on: 06/29/2020 03:16 PM     Modules accepted: Level of Service

## 2020-06-30 ENCOUNTER — Encounter: Payer: Self-pay | Admitting: Internal Medicine

## 2020-07-03 ENCOUNTER — Encounter: Payer: Self-pay | Admitting: Internal Medicine

## 2020-07-03 ENCOUNTER — Telehealth: Payer: Self-pay | Admitting: Dermatology

## 2020-07-03 MED ORDER — VALACYCLOVIR HCL 500 MG OR TABS
500.0000 mg | ORAL_TABLET | Freq: Two times a day (BID) | ORAL | 0 refills | Status: DC
Start: 2020-07-03 — End: 2020-07-20

## 2020-07-03 NOTE — Telephone Encounter (Signed)
Dr. Boyce Medici refill Valtrex for patient.

## 2020-07-10 ENCOUNTER — Institutional Professional Consult (permissible substitution): Payer: No Typology Code available for payment source | Admitting: Psychiatry

## 2020-07-15 ENCOUNTER — Other Ambulatory Visit: Payer: Self-pay | Admitting: Dermatology

## 2020-07-16 MED ORDER — MYCOPHENOLATE MOFETIL 500 MG OR TABS
1000.0000 mg | ORAL_TABLET | Freq: Two times a day (BID) | ORAL | 11 refills | Status: DC
Start: 2020-07-16 — End: 2020-08-09

## 2020-07-17 ENCOUNTER — Telehealth: Payer: Self-pay | Admitting: Ob/Gyn

## 2020-07-17 NOTE — Telephone Encounter (Signed)
Called pt and booked sooner appt for pt tomorrow

## 2020-07-17 NOTE — Telephone Encounter (Signed)
Patient calling in to schedule appointment with MD due to vaginal burning sensation. I offered the next available on 1/31. Patient would like a sooner appointment or what would MD advise. Please further assist.

## 2020-07-18 ENCOUNTER — Encounter: Payer: Self-pay | Admitting: Internal Medicine

## 2020-07-18 ENCOUNTER — Ambulatory Visit: Payer: No Typology Code available for payment source | Admitting: Ob/Gyn

## 2020-07-18 ENCOUNTER — Encounter: Payer: Self-pay | Admitting: Ob/Gyn

## 2020-07-18 ENCOUNTER — Other Ambulatory Visit
Admission: RE | Admit: 2020-07-18 | Discharge: 2020-07-18 | Disposition: A | Discharge status: Home / Self Care | Payer: No Typology Code available for payment source | Source: Ambulatory Visit | Attending: Ob/Gyn | Admitting: Ob/Gyn

## 2020-07-18 VITALS — BP 118/71 | HR 87 | Temp 96.8°F | Ht 60.0 in | Wt 204.2 lb

## 2020-07-18 DIAGNOSIS — N9489 Other specified conditions associated with female genital organs and menstrual cycle: Secondary | ICD-10-CM | POA: Insufficient documentation

## 2020-07-18 NOTE — Progress Notes (Signed)
[id number]   [place]  07/18/2020   Gender: female  Current Location: [facility]  DOB: 04-Mar-1971      OB/GYN      Amb Follow Up Note, Gynecology   Clinician Documentation:    Jewel Baize, M.D,  [place]   OB/GYN    Chief Complaint:     History of Present Illness:  This is a 49 year old female, G2P1011  LMP: No LMP recorded. (Menstrual status: IUD).  Started having vulvar burning yesterday  No burning with urination, frequency or urgency  Noted after sitting the car wearing tight pants  Lasted 5 hours and through the night  Today is slightly better  No new soap or detergent  No discharge, itching or odor  Located on left side at opening to vagina  H/o hsv take prn acyclovir, last took 2 weeks ago.  Symptoms not on genitalia but lower back    Medications:    Current Outpatient Medications:     ALPRAZolam (XANAX) 0.25 MG tablet, Take 1 tablet (0.25 mg) by mouth as needed for Anxiety (2 hours prior to IV infusion)., Disp: 10 tablet, Rfl: 1    buPROPion (WELLBUTRIN XL) 150 MG XL tablet, Take 1 tablet (150 mg) by mouth every morning., Disp: 90 tablet, Rfl: 3    clotrimazole-betamethasone (LOTRISONE) 1-0.05 % cream, Apply 1 Application topically 2 times daily. Use a small amount as directed to armpits, Disp: 1 Tube, Rfl: 11    diphenhydrAMINE (BENADRYL) 50 MG/ML injection, Please give 50 mg IV push prior to Rituxan infusions.  If needed may give a repeat 25 mg IV push dose., Disp: 1 vial, Rfl: 8    doxycycline (MONODOX) 100 MG capsule, Take 1 capsule (100 mg) by mouth 2 times daily., Disp: 60 capsule, Rfl: 11    Multiple Vitamin (MULTIVITAMIN) capsule, Take 1 capsule by mouth daily., Disp: 30 tablet, Rfl: 0    mycoPHENOLate mofetil (CELLCEPT) 500 MG tablet, TAKE 2 TABLETS (1,000 MG) BY MOUTH 2 TIMES DAILY. START THIS AFTER YOU FINISH YOUR RITUXAN INFUSION REGIMEN, Disp: 120 tablet, Rfl: 11    mycoPHENOLate mofetil (CELLCEPT) 500 MG tablet, Take 2 tablets (1,000 mg) by mouth 2 times  daily., Disp: 60 tablet, Rfl: 11    niacinamide 500 MG tablet, Take 1 tablet (500 mg) by mouth 3 times daily (with meals)., Disp: 30 tablet, Rfl: 11    valACYclovir (VALTREX) 500 MG tablet, Take 1 tablet (500 mg) by mouth 2 times daily. Take 1 tablet twice a day for 10 days, Disp: 20 tablet, Rfl: 0    vitamin D3 50 MCG (2000 UT) capsule, by Oral route., Disp: , Rfl:     Allergies:  No Known Allergies    Review of Systems:  . Constitutional:  Denies fever, chills, weight loss, weight gain  . Head & Neck:  Denies headache, vision change, hearing change, sore throat  . Respiratory:  Denies shortness of breath, cough  . Cardiovascular:  Denies chest pain/pressure, orthopnea, PND, palpitations  . Gastrointestinal:  Denies abdominal pain, nausea, vomiting, diarrhea, or rectal bleeding  . Genitourinary:  Denies dysuria, polyuria, hesitancy, frequency  . Musculoskeletal:  Denies joint pain, leg edema  . Neurological:  Denies  focal numbness, weakness  . All other Review of Systems is negative   .   Physical Exam:   Vital Signs:  BP 118/71 (BP Location: Left arm, BP Patient Position: Sitting)    Pulse 87    Temp 96.8  F (36 C) (Temporal)    Ht 5' (1.524 m)    Wt 92.6 kg (204 lb 3.2 oz)    BMI 39.88 kg/m     General:  female, in nad   GU/Pelvic:    BUS: no lesions, masses or tenderness   CX/VAG: no discharge, lesions or odor, area of symptoms are left posterior introitus, no skin changes or lesions   Biman: normal uterus size and contour, no cmt.  No adnexal masses or tenderness    Extremities:   no edema   Neurological:  alert and oriented x 3, motor 5/5, sensory intact   Skin:  no suspicious lesions     Diagnostic Data:    Assessment/Plan  Berania Peedin is a 49 year old  presenting today for :     ICD-10-CM ICD-9-CM   1. Vulvar burning  N94.89 625.9   no abnl exam findings  Possible contact irritation    Plan  Epsom salts sitz bath  Can use otc hydrocortisone  Vaginitis collected, will notify and call in rx if  needed. Pt will be in [place], ok to notify on mychart  If not better or worsen to call.            Jewel Baize, M.D.   Ob/Gyn Attending

## 2020-07-20 ENCOUNTER — Encounter: Payer: Self-pay | Admitting: Internal Medicine

## 2020-07-20 LAB — GENITAL CULTURE: Gram Stain: NONE SEEN

## 2020-07-20 MED ORDER — VALACYCLOVIR HCL 500 MG OR TABS
500.0000 mg | ORAL_TABLET | Freq: Two times a day (BID) | ORAL | 0 refills | Status: DC
Start: 2020-07-20 — End: 2020-09-19

## 2020-08-09 ENCOUNTER — Other Ambulatory Visit: Payer: Self-pay | Admitting: Dermatology

## 2020-08-09 DIAGNOSIS — L100 Pemphigus vulgaris: Secondary | ICD-10-CM

## 2020-08-09 MED ORDER — MYCOPHENOLATE MOFETIL 500 MG OR TABS
ORAL_TABLET | ORAL | 11 refills | Status: DC
Start: 2020-08-09 — End: 2020-09-19

## 2020-08-16 ENCOUNTER — Encounter: Payer: Self-pay | Admitting: Internal Medicine

## 2020-08-17 ENCOUNTER — Encounter: Payer: Self-pay | Admitting: Internal Medicine

## 2020-08-17 MED ORDER — BUPROPION XL (DAILY) 150 MG OR TB24
150.0000 mg | ORAL_TABLET | Freq: Every morning | ORAL | 3 refills | Status: DC
Start: 2020-08-17 — End: 2021-08-12

## 2020-09-10 ENCOUNTER — Ambulatory Visit: Payer: No Typology Code available for payment source | Admitting: Dermatology

## 2020-09-12 ENCOUNTER — Ambulatory Visit: Payer: No Typology Code available for payment source | Admitting: Dermatology

## 2020-09-13 ENCOUNTER — Encounter: Payer: Self-pay | Admitting: Internal Medicine

## 2020-09-14 ENCOUNTER — Encounter: Payer: Self-pay | Admitting: Internal Medicine

## 2020-09-14 DIAGNOSIS — L100 Pemphigus vulgaris: Secondary | ICD-10-CM

## 2020-09-14 MED ORDER — ALPRAZOLAM 0.25 MG OR TABS
0.2500 mg | ORAL_TABLET | ORAL | 1 refills | Status: DC | PRN
Start: 2020-09-14 — End: 2020-10-29

## 2020-09-19 ENCOUNTER — Encounter: Payer: Self-pay | Admitting: Psychiatry

## 2020-09-19 ENCOUNTER — Telehealth: Payer: No Typology Code available for payment source | Admitting: Psychiatry

## 2020-09-19 VITALS — Ht 60.0 in | Wt 203.0 lb

## 2020-09-19 DIAGNOSIS — G4719 Other hypersomnia: Secondary | ICD-10-CM

## 2020-09-19 DIAGNOSIS — F515 Nightmare disorder: Secondary | ICD-10-CM

## 2020-09-19 DIAGNOSIS — G4761 Periodic limb movement disorder: Secondary | ICD-10-CM

## 2020-09-19 DIAGNOSIS — E662 Morbid (severe) obesity with alveolar hypoventilation: Secondary | ICD-10-CM

## 2020-09-19 DIAGNOSIS — G4700 Insomnia, unspecified: Secondary | ICD-10-CM

## 2020-09-19 DIAGNOSIS — F458 Other somatoform disorders: Secondary | ICD-10-CM

## 2020-09-19 DIAGNOSIS — G4733 Obstructive sleep apnea (adult) (pediatric): Secondary | ICD-10-CM

## 2020-09-19 DIAGNOSIS — E669 Obesity, unspecified: Secondary | ICD-10-CM

## 2020-09-19 NOTE — Patient Instructions (Signed)
1. We will arrange an in-lab sleep study (polysomnography).   2. If the sleep study is positive for OSA, then you'll be placed on one the PAP therapies in a form of APAP (automatic PAP), CPAP or BPAP.   3. If you can not tolerate a PAP therapy, then an alternative treatment might be considered after reviewing the treatment outcome in the sleep clinic.   4. Use caution in operating machinery or driving a vehicle until you have the OSA diagnosed and treated.  5. Follow up to the sleep clinic/center will be arranged after the sleep study.

## 2020-09-19 NOTE — Progress Notes (Signed)
Physician Requesting The Consultation:  Boyce Medici, [address]  [place],  [place] [address]    Chief complaint (Questions to Be Answered): snoring, daytime sleepiness, insomnia, restless legs    History of Presenting Illness:  Anita Turner is being seen in the sleep clinic requested by Dr. Alfonse Spruce for further evaluation of the above symptoms noted in the chief complaint section.      a. Nature of sleep symptoms: snoring, daytime sleepiness, insomnia, restless legs  Onset:  2016 with weight gain and insomnia due to steroid (started for pemphigus bulgaris)      b. Sleep schedule:  Bed time: 2200  Sleep latency: 15 min  Number of awakening: 2  Final awakening time: 0700  Estimate sleep time: 5 h  Nap: no per week    c. Sleeping Environment  Medication/food at bed time: melatonin 9mg   Dark, quiet, cool bed room? yes  Sleep partner issue: no  Sleeping position: left side or stomach      d. Sleep Quality  Urge to move your legs? no  Snoring? yes  Gasping/snorting? no  Witnessed apneas? no  Headache/heartburn/pain? no  Sleep hallucination? no  Sleep paralysis? no  Dream quality: nightmares when sleeping on the back  Sleep behavior (sleepwalking, sleep eating, sleep terror, acting out dream): leg moving, teeth grinding  Nocturia? yes    e. Daytime Sleepiness and Sleep Related Problems  Regular daytime activity? (work schedule) walk, yoga  Excessive daytime sleepiness? yes  Caffeine use?  Not much.  (latest time of caffeine use:na)  Stimulant med? (name, dosing time, for what?) no  Sleep attacks (ever fallen asleep in potentially dangerous situations?)     no    f. comorbid conditions  Depression? yes  Anxiety? yes  T&A? no  UPPP? no  Supplemental oxygen? no  COPD/asthma? no  Head injury? no  Seizure? no    g. family sleep issue: a brother with OSA  Insomnia:  OSA:  Narcolepsy:  RLS:  Sleepwalking:    Epworth Sleep Assessment Questionnaire 09/17/2020   Sitting and Reading 3   Watching TV 0   Sitting inactive in a public  place (mtg, church, theater,etc) 1   As a passenger in a car for an hour without a break 3   Lying down to rest in the afternoon when the circumstances permit 1   Sitting and talking to someone 0   Sitting quietly after lunch without alcohol 0   In a car, while stopped for a few minutes in traffic 0   Epworth Total Score 8       FOSQ Score: 16     Past Medical History:   Diagnosis Date   . Abnormal EKG 03/18/2017   . 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 Class 2 obesity due to excess calories without serious comorbidity with body mass index (BMI) of 38.0 to 38.9 in adult 04/30/2017   . Foot pain 06/23/2018   . Ingrowing toenail 06/23/2018   . Nephrolithiasis    . Obesity with body mass index 30 or greater 09/05/2016   . Osteoarthritis of knee 09/05/2016   . Pain of right thumb 02/06/2016   . Patellofemoral stress syndrome 08/22/2016   . 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 Pemphigus vulgaris 03/18/2019   . Prediabetes 06/14/2018   . Shortness of breath 03/18/2017   . Strain of muscle of right hip 09/05/2016   . Synovitis 02/06/2016        MEDS:  Current  Outpatient Medications on File Prior to Visit   Medication Sig Dispense Refill   . ALPRAZolam (XANAX) 0.25 MG tablet Take 1 tablet (0.25 mg) by mouth as needed for Anxiety (2 hours prior to IV infusion). 10 tablet 1   . buPROPion (WELLBUTRIN XL) 150 MG XL tablet Take 1 tablet (150 mg) by mouth every morning. 90 tablet 3   . clotrimazole-betamethasone (LOTRISONE) 1-0.05 % cream Apply 1 Application topically 2 times daily. Use a small amount as directed to armpits 1 Tube 11   . diphenhydrAMINE (BENADRYL) 50 MG/ML injection Please give 50 mg IV push prior to Rituxan infusions.  If needed may give a repeat 25 mg IV push dose. 1 vial 8   . Multiple Vitamin (MULTIVITAMIN) capsule Take 1 capsule by mouth daily. 30 tablet 0   . [DISCONTINUED] mycoPHENOLate mofetil (CELLCEPT) 500 MG tablet 2 TABS TWICE A DAY START AFTER FINISHING RITUXAN INFUSION REGIMEN 120 tablet 11   . mycoPHENOLate  mofetil (CELLCEPT) 500 MG tablet Take 2 tablets (1,000 mg) by mouth 2 times daily. (Patient taking differently: Take 500 mg by mouth daily. ) 60 tablet 11   . [DISCONTINUED] niacinamide 500 MG tablet Take 1 tablet (500 mg) by mouth 3 times daily (with meals). 30 tablet 11   . [DISCONTINUED] valACYclovir (VALTREX) 500 MG tablet Take 1 tablet (500 mg) by mouth 2 times daily. Take 1 tablet twice a day for 10 days 20 tablet 0   . vitamin D3 50 MCG (2000 UT) capsule by Oral route. Three times a week 5086mcg       No current facility-administered medications on file prior to visit.     ALL:  Patient has no known allergies.    Family History:  Family History   Problem Relation Name Age of Onset   . Diabetes Mother          HTN, DJD   . Neurological Disease (Parkinsons/Huntingtons/Huntingtons Chorea) Father          DM, alcoholism   . Diabetes Father     . No Known Problems Sister     . Thyroid Brother     . Cataract Maternal Grandfather     . Cataract Paternal Grandfather         Social History     Tobacco Use   . Smoking status: Never Smoker   . Smokeless tobacco: Never Used   Substance Use Topics   . Alcohol use: Not Currently   . Drug use: Never         Review of Systems:  10/14 systems reviewed, all negative except where mentioned in HPI or below:  Review of Systems       Physical Examination  Vitals:    09/19/20 0801   Weight: 92.1 kg (203 lb)   Height: 5' (1.524 m)   Estimated body mass index is 39.65 kg/m as calculated from the following:    Height as of this encounter: 5' (1.524 m).    Weight as of this encounter: 92.1 kg (203 lb).    General:  Appearance is consistent with stated age and good nutritional status.  There are no apparent signs of acute distress.   Head and Face:  No head or facial deformity, no facial weakness or involuntary movement.  Throat:  Mallampati: 4.  Neck: Supple, symmetrical.  Respiratory:  respirations are relaxed and even   Neurologic:  Alert and oriented.    Psychological/MSE:   Appropriate dress and hygiene, alert, attentive, comfortable  eye contact, easily engaged in conversation at a moderate pace, word choice and articulation are effortless and appropriate, mood is appropriate, affect is full, normal intensity and mood congruent, orientated, recent and remote memory are intake, normal thought content.      Results/Records Reviewed: PSG ordered.     Impression:  Anita Turner is a 50 year old female presenting with symptoms and signs of OSA, hypoventilation, periodic limb movements of sleep (PLMS) and bruxism. Additional diagnoses and differential diagnoses are included the following Problem List as well    Problem List    ICD-10-CM ICD-9-CM    1. OSA (obstructive sleep apnea)  G47.33 327.23 Overnight Polysomnography   2. Insomnia, unspecified type  G47.00 780.52    3. Obesity hypoventilation syndrome (CMS-HCC)  E66.2 278.03 Overnight Polysomnography   4. Periodic limb movements of sleep  G47.61 327.51 Overnight Polysomnography   5. Bruxism  F45.8 306.8 Overnight Polysomnography   6. Nightmares  F51.5 307.47    7. Obesity (BMI 30-39.9)  E66.9 278.00    8. Excessive daytime sleepiness  G47.19 780.54        Plan:  1.  Good sleep hygiene.  2.  Polysomnogram to evaluate for OSA, hypoventilation, periodic limb movements of sleep (PLMS) and bruxism.   3.  Discussed the risk of untreated OSA including vascular complications and driving while fatigued.   4.  Nutrition and weight management.  5.  Avoid unnecessary CNS stimulants or depressants.  6.  Discussed dangers of untreated EDS and of driving while fatigued.  7.  Follow up with an NP after the sleep study.    Patient instructions were given as the below:    Staff Attestation  I have personally provided 45 minutes of clinical care time. Time includes review of sleep questionnaire, clarification of symptoms, discussion of differential diagnosis, necessary work-ups, contingency plans, communication to arrange next steps and documentation of  the above.         Staff Involved: Attending Physician Only  Raeford Razor, MD, MS  Attending Physician  Sleep Medicine

## 2020-09-21 ENCOUNTER — Ambulatory Visit: Payer: No Typology Code available for payment source | Admitting: Dermatology

## 2020-09-21 VITALS — BP 134/81 | HR 84

## 2020-09-21 DIAGNOSIS — Z79899 Other long term (current) drug therapy: Secondary | ICD-10-CM

## 2020-09-21 DIAGNOSIS — L100 Pemphigus vulgaris: Secondary | ICD-10-CM

## 2020-09-21 DIAGNOSIS — B009 Herpesviral infection, unspecified: Secondary | ICD-10-CM

## 2020-09-21 MED ORDER — VALACYCLOVIR HCL 500 MG OR TABS
500.0000 mg | ORAL_TABLET | Freq: Every day | ORAL | 2 refills | Status: DC
Start: 2020-09-21 — End: 2020-10-17

## 2020-09-21 NOTE — Interdisciplinary (Signed)
Anita Turner is a 50 year old female consult

## 2020-09-23 NOTE — Progress Notes (Signed)
Department of Dermatology  Anita Turner  PATIENT: Anita Turner  MRN: [id number]  DOB: 08-20-1970  DATE OF SERVICE: 09/21/2020    DERMATOLOGY FOLLOW-UP PATIENT VISIT     REFERRING PRACTITIONER: Self, Referred  PRIMARY CARE PROVIDER: Boyce Medici  CHIEF COMPLAINT:   Chief Complaint   Patient presents with   . 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 Charm Barges). Last seen 06/2020 and has been off IVIg since November. Was previously on rituxan as well. Now only taking 500 mg cellcept daily with no new symptoms or flares. Her main involvement was oral mucosa but also had skin involvement during her last flare. Has never had vulvovaginal involvement. But does have recurrent vulvovaginal herpes and takes valtrex at first signs of symptoms. Has this frequently, at least 6 episodes per year.     Reports multiple side effects with IVIg and in the future if needs to resume would prefer to do IVIg in infusion center instead of at home.     No vulvovaginal discharge, pain, burning, itching, or other symptoms.    Has a teenager daughter. Her husband was recently diagnosed with bipolar disorder and this, in the setting of moving form [place] to [place] during the pandemic, created a lot of stress. Particularly as did not have friend/family network in the area.     ROS: No fevers, chills, cough    Past Medical:Pt has a past medical history of Abnormal EKG (03/18/2017), Chondromalacia of right patella (08/13/2017), Class 2 obesity due to excess calories without serious comorbidity with body mass index (BMI) of 38.0 to 38.9 in adult (04/30/2017), Foot pain (06/23/2018), Ingrowing toenail (06/23/2018), Nephrolithiasis, Obesity with body mass index 30 or greater (09/05/2016), Osteoarthritis of knee (09/05/2016), Pain of right thumb (02/06/2016), Patellofemoral stress syndrome  (08/22/2016), 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  Social History: Pt reports that she has never smoked. She has never used smokeless tobacco. She reports previous alcohol use. She reports that she does not use drugs.  Family History:Pt family history includes Cataract in her maternal grandfather and paternal grandfather; Diabetes in her father and mother; Neurological Disease (Parkinsons/Huntingtons/Huntingtons Chorea) in her father; No Known Problems in her sister; Thyroid in her brother.  Home Medications: Pt has a current medication list which includes the following prescription(s): alprazolam, bupropion, clotrimazole-betamethasone, diphenhydramine, multivitamin, mycophenolate mofetil, valacyclovir, and vitamin d3.  Allergies: Patient has no known allergies.    Objective:   General: Pleasant, in no acute distress, appears stated age  Psychiatric: Mood and affect are within normal limits  Respiratory: No increased work of breathing     09/21/20  1438   BP: 134/81   Pulse: 84       Skin Exam:  Skin Type: IV  Patient declines a chaperone today  Pertinent skin findings below:  Skin exam performed including scalp, face, conjunctiva, lips, oral mucosa, hands, and the following was noted:    Patient denies vulvovaginal concerns or symptoms  No oral lesions or gingival erythema    02/07/2019   Basement Membrane Zone (BMZ) IgG, IgG4, and IgA  IgG: Negative, monkey esophagus substrate     Negative, human split skin substrate      IgG4: Negative, monkey esophagus substrate  Negative, human split skin substrate   IgA: Negative, monkey esophagus substrate     Negative, human split skin substrate   Positive IgG, including IgG4, cell surface antibodies,         monkey esophagus substrate     Enzyme Linked Immunosorbent Assay (ELISA)    --------------------------------------------------   Bullous Pemphigoid (BP) 180 and 230 IgG Antibodies   IgG BP 180 antibodies: 1 unit  NEGATIVE  IgG BP 230 antibodies: 1 unit  NEGATIVE    02/07/2019  Cell Surface IgG Antibodies   IgG: Positive, titer 1:2560 (H), monkey esophagus substrate     Positive, titer 1:640 (H), intact human skin substrate     Enzyme-Linked Immunosorbent Assay (ELISA)  -----------------------------------------  Desmoglein (DSG) 1 and 3 IgG Antibodies   IgG desmoglein 1 antibodies:  7 units  NEGATIVE    Reference Range:      Positive (H) = Greater than 20 units      Borderline/Indeterminate = 14-20 units      Negative = Less than 14 units   IgG desmoglein 3 antibodies: 320 units (H)  POSITIVE     (Initial level, 139 units, greater than high         calibrator; diluted to achieve 64 units, within      assay calibrators, and multiplied by the dilution      factor of 5)   COMMENTS   Specific   -------------------------------------------------   These indirect immunofluorescence results, demonstrating   positive IgG cell surface antibody reactivity on both   monkey esophagus and intact human skin substrates, support   the diagnosis of pemphigus.    Assessment/Plan     # Pemphigus vulgaris   # High risk medication monitoring  Reviewed prior notes (patient followed by Dr. Charm Barges) and prior labwork including serologies for pemphigus/pemphigoid, CBC, CMP. She was previously on rituxan. Was also on IVIg monthly but stopped in November due to difficulty tolerating infusions.   Has no active clinical findings today and is asymptomatic. Denies vulvovaginal involvement. Plan was for her to be on cellcept 1000 mg bid but she is on 500 mg daily and well-controlled (not on doxy and NCN) and prefers to stay on this regimen if possible.   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 Risks, benefits, alternatives to therapy were discussed. Questions related to this recommended plan of care were answered.    RTC 2 months or sooner prn    Marcelle Overlie, MD

## 2020-09-28 ENCOUNTER — Other Ambulatory Visit
Admission: RE | Admit: 2020-09-28 | Discharge: 2020-09-28 | Disposition: A | Discharge status: Home / Self Care | Payer: No Typology Code available for payment source | Source: Ambulatory Visit | Attending: Dermatology | Admitting: Dermatology

## 2020-09-28 DIAGNOSIS — Z79899 Other long term (current) drug therapy: Secondary | ICD-10-CM | POA: Insufficient documentation

## 2020-09-28 LAB — CBC WITH DIFF, BLOOD
ANC automated: 5 10*3/uL (ref 2.0–8.1)
Basophils %: 0.9 %
Basophils Absolute: 0.1 10*3/uL (ref 0.0–0.2)
Eosinophils %: 2.9 %
Eosinophils Absolute: 0.3 10*3/uL (ref 0.0–0.5)
Hematocrit: 41.2 % (ref 34.0–44.0)
Hgb: 13.7 G/DL (ref 11.5–15.0)
Lymphocytes %: 36 %
Lymphocytes Absolute: 3.3 10*3/uL (ref 0.9–3.3)
MCH: 28.2 PG (ref 27.0–33.5)
MCHC: 33.2 G/DL (ref 32.0–35.5)
MCV: 85 FL (ref 81.5–97.0)
MPV: 8.8 FL (ref 7.2–11.7)
Monocytes %: 6.4 %
Monocytes Absolute: 0.6 10*3/uL (ref 0.0–0.8)
Neutrophils % (A): 53.8 %
PLT Count: 376 10*3/uL (ref 150–400)
RBC: 4.85 10*6/uL (ref 3.70–5.00)
RDW-CV: 13.2 % (ref 11.6–14.4)
White Bld Cell Count: 9.2 10*3/uL (ref 4.0–10.5)

## 2020-09-28 LAB — COMPREHENSIVE METABOLIC PANEL, BLOOD
ALT: 20 U/L (ref 7–52)
AST: 22 U/L (ref 13–39)
Albumin: 3.9 G/DL (ref 3.7–5.3)
Alk Phos: 92 U/L (ref 34–104)
BUN: 6 mg/dL — ABNORMAL LOW (ref 7–25)
Bilirubin, Total: 0.3 mg/dL (ref 0.0–1.4)
CO2: 28 mmol/L (ref 21–31)
Calcium: 9.1 mg/dL (ref 8.6–10.3)
Chloride: 105 mmol/L (ref 98–107)
Creat: 0.8 mg/dL (ref 0.6–1.2)
Electrolyte Balance: 6 mmol/L (ref 2–12)
Glucose: 107 mg/dL (ref 70–115)
Potassium: 4.2 mmol/L (ref 3.5–5.1)
Protein, Total: 6.5 G/DL (ref 6.0–8.3)
Sodium: 139 mmol/L (ref 136–145)
eGFR - high estimate: >60 (ref >59)
eGFR - low estimate: >60 (ref >59)

## 2020-09-28 NOTE — Addendum Note (Signed)
Addended by: Lyla Son on: 09/28/2020 02:21 PM     Modules accepted: Orders

## 2020-10-12 ENCOUNTER — Encounter: Payer: Self-pay | Admitting: Retina Specialist

## 2020-10-12 ENCOUNTER — Ambulatory Visit: Payer: No Typology Code available for payment source | Attending: Retina Specialist | Admitting: Retina Specialist

## 2020-10-12 DIAGNOSIS — H43823 Vitreomacular adhesion, bilateral: Secondary | ICD-10-CM

## 2020-10-12 DIAGNOSIS — H2513 Age-related nuclear cataract, bilateral: Secondary | ICD-10-CM | POA: Insufficient documentation

## 2020-10-12 DIAGNOSIS — L100 Pemphigus vulgaris: Secondary | ICD-10-CM | POA: Insufficient documentation

## 2020-10-12 NOTE — Progress Notes (Signed)
50 yo NP referred by PCP, Dr. Autumn Messing  Pt on Prednisone 40mg  daily PO-tapering from 80mg , has been on since July this year and also for two years from 2017.  Pt with Pemphigus Vulgaris.  Stopped steroids about 1 year ago  Since starting Pred pt has noticed more difficulty reading street signs-lights are also blurry  Also notes outer corners OU-"are a bit baggy"  No irritation-notes intermittent heaviness -eye fatigue  No Ocular Surgeries OU  No gtts ou.  Pt states she as previously on Prednisone in 2017-stopped 2019  Pt recently located from [place] this summer.  Using IVIG.  1. Pemphigus vulgaris  2. Vitreomacular adhesion of both eyes  3. Age-related nuclear cataract of both eyes  OU: 20/25 in OD and 20/30 in OS, clear cornea, 1+ nuclear sclerosis without PSC, no cell in AC/AV, good foveal contour, no SRF, no RT/RD  Observe      _______________________________________   Extended Ophthalmoscopy 10/12/20  by slit lamp and indirect methods reveals:    Main Ophthalmology Exam     External Exam       Right Left    External Normal Normal          Slit Lamp Exam       Right Left    Lids/Lashes Normal for Age Normal for Age    Conjunctiva/Sclera White and quiet White and quiet    Cornea Clear Clear    Anterior Chamber Deep and quiet Deep and quiet    Iris Round, no rubeosis Round, no rubeosis    Lens 1+ Nuclear sclerosis 1+ Nuclear sclerosis    Vitreous Clear Clear          Fundus Exam       Right Left    Disc Healthy contour and size Healthy contour and size    Macula Normal contour and reflex for age, VMA, no heme Normal contour and reflex for age, VMA, no SRF    Vessels Normal Normal    Periphery Flat and attached 360 degrees Flat and attached 360 degrees             <div id="MAIN_EXAM_REVIEWED"></div>     - See Assesment/plan above    Follow-up plan:  [facility]  OCTm Fundus photo FA ICG FAF OCT  RNFL Bscan Other  Injection Laser   12 Months  ou                 I reviewed and confirmed all history components,  including the HPI; and any other elements performed by the technician. I performed the key and critical portions of the exam. The final examination findings, image interpretations, and plan as documented in the record represent my personal judgement and conclusion.     Leola Brazil MD.

## 2020-10-17 ENCOUNTER — Other Ambulatory Visit: Payer: Self-pay | Admitting: Dermatology

## 2020-10-17 MED ORDER — VALACYCLOVIR HCL 500 MG OR TABS
ORAL_TABLET | ORAL | 8 refills | Status: DC
Start: 2020-10-17 — End: 2020-11-22

## 2020-10-19 ENCOUNTER — Encounter: Payer: Self-pay | Admitting: Internal Medicine

## 2020-10-19 ENCOUNTER — Encounter: Payer: Self-pay | Admitting: Dermatology

## 2020-10-23 ENCOUNTER — Encounter: Payer: Self-pay | Admitting: Dermatology

## 2020-10-25 ENCOUNTER — Telehealth: Payer: Self-pay | Admitting: Internal Medicine

## 2020-10-25 NOTE — Telephone Encounter (Signed)
-----   Message from Boyce Medici, MD sent at 10/25/2020  8:01 AM PDT -----  Regarding: FW: Appointment scheduled from [place]: [phone]  Yes. Ok to proceed  ----- Message -----  From: Reina Fuse, Theodis Shove  Sent: 10/24/2020   3:37 PM PDT  To: Boyce Medici, MD  Subject: FW: Appointment scheduled from [place]           Dr. Alfonse Spruce ok to proceed with apt?   ----- Message -----  From: Thurnell Garbe D  Sent: 10/24/2020   2:44 PM PDT  To: [facility] Pool  Subject: FW: Appointment scheduled from [facility] staff,  Is this ok for in person or zoom? Please advise.  Thank you   ----- Message -----  From: Azzie Glatter  Sent: 10/24/2020   1:30 PM PDT  To: [facility] Office Pool  Subject: Appointment scheduled from [facility] For: Azzie Glatter [phone])  Visit Type: [facility] BRIEF VISIT (9449)    10/29/2020   11:40 AM  20 mins.  Boyce Medici, MD       [facility]    Patient Comments:  Body pains

## 2020-10-25 NOTE — Telephone Encounter (Signed)
Spoke to patient ok to proceed with appointment on 10/29/20 arrive 11:20 am.  Patient voiced understanding.

## 2020-10-29 ENCOUNTER — Ambulatory Visit: Payer: No Typology Code available for payment source | Admitting: Internal Medicine

## 2020-10-29 ENCOUNTER — Encounter: Payer: Self-pay | Admitting: Internal Medicine

## 2020-10-29 VITALS — BP 122/65 | HR 79 | Temp 98.1°F | Ht 60.0 in | Wt 203.1 lb

## 2020-10-29 DIAGNOSIS — R52 Pain, unspecified: Secondary | ICD-10-CM

## 2020-10-29 MED ORDER — FLUCONAZOLE 150 MG OR TABS
ORAL_TABLET | ORAL | 0 refills | Status: DC
Start: 2020-10-29 — End: 2020-10-29

## 2020-10-29 MED ORDER — CLONAZEPAM 0.5 MG OR TABS
0.5000 mg | ORAL_TABLET | Freq: Two times a day (BID) | ORAL | 0 refills | Status: DC | PRN
Start: 2020-10-29 — End: 2021-03-18

## 2020-10-29 MED ORDER — NALTREXONE HCL 50 MG OR TABS
50.0000 mg | ORAL_TABLET | Freq: Every day | ORAL | 0 refills | Status: DC
Start: 2020-10-29 — End: 2020-11-30

## 2020-10-29 NOTE — Patient Instructions (Addendum)
When starting naltrexone: tale a quarter tablet (12.5 mg) in the morning for 1 week. For the next week take a quarter tablet in the AM and in the PM. For the 3rd week take half a tablet (25 mg) in the morning and a quarter tablet in the PM. Then take half a tablet in the morning and half a tablet in the PM from then on. Continue to monitor your weight.     You can switch from xanax to klonipin. Over the counter sleep medications include Unisom or melatonin

## 2020-10-29 NOTE — Interdisciplinary (Signed)
Pt is here to discuss generalized body aches x 2 years  And has been having trouble staying asleep for the past 2 years as well

## 2020-10-29 NOTE — Progress Notes (Unsigned)
[facility] History and Physical    PATIENT:  Anita Turner  MRN:  [id number]  DOB:  19-Jul-1971  DATE OF SERVICE:  04/28/2020    PRIMARY CARE PROVIDER: Boyce Medici    Chief Complaint   Patient presents with   . Other     generalized body pain x 2 years    . Sleep Problem     trouble staying asleep x 2 years        Subjective:    Anita Turner is a 50 year old female with hx of pemphigus vulgaris on IVIG, anxiety who presents for f/u    Has been on wellbutrin for anxiety xanax does help     Reports diffuse pain on her knees, hips, hands. Worse in the AM, about 7/10 in severity. Rodman Pickle which helps.     Previously:     Pt reports depressed mood. Recently moved and does not have any friends in the area. She has protective factors of her family including her husband and daughter. Denies SI HI. She reports insomnia, has tried multiple sleep medications prior now using Melatonin and tea, unable to maintain sleep. Usually does not have issues with sleep initiation. Husband reports snoring     Pemphigus vulgaris: reports sxs well controlled. Has been on IVIG. Previously on high dose steroids with elevated WBC then tapered since 07/2019. Recent ED visit labs in 03/12/2020 demonstrating continued leukocytosis and thrombocytosis.     Denies any new symptoms, at baseline level of health. Has history of anxiety, which she has been using alprazolam PRN.     Past Medical History:   Past Medical History:   Diagnosis Date   . Abnormal EKG 03/18/2017   . 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 Class 2 obesity due to excess calories without serious comorbidity with body mass index (BMI) of 38.0 to 38.9 in adult 04/30/2017   . Foot pain 06/23/2018   . Ingrowing toenail 06/23/2018   . Nephrolithiasis    . Obesity with body mass index 30 or greater 09/05/2016   . Osteoarthritis of knee 09/05/2016   . Pain of right thumb 02/06/2016   . Patellofemoral stress syndrome 08/22/2016   . 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 Pemphigus  vulgaris 03/18/2019   . Prediabetes 06/14/2018   . Shortness of breath 03/18/2017   . Strain of muscle of right hip 09/05/2016   . Synovitis 02/06/2016        Past Surgical History:   Past Surgical History:   Procedure Laterality Date   . CHOLECYSTECTOMY  2017   . APPENDECTOMY  2002       Current Medications:   Current Outpatient Medications   Medication Sig Dispense Refill   . ALPRAZolam (XANAX) 0.25 MG tablet Take 1 tablet (0.25 mg) by mouth as needed for Anxiety (2 hours prior to IV infusion). 10 tablet 1   . buPROPion (WELLBUTRIN XL) 150 MG XL tablet Take 1 tablet (150 mg) by mouth every morning. 90 tablet 3   . clotrimazole-betamethasone (LOTRISONE) 1-0.05 % cream Apply 1 Application topically 2 times daily. Use a small amount as directed to armpits 1 Tube 11   . diphenhydrAMINE (BENADRYL) 50 MG/ML injection Please give 50 mg IV push prior to Rituxan infusions.  If needed may give a repeat 25 mg IV push dose. 1 vial 8   . Multiple Vitamin (MULTIVITAMIN) capsule Take 1 capsule by mouth daily. 30 tablet 0   . mycoPHENOLate mofetil (CELLCEPT)  500 MG tablet Take 2 tablets (1,000 mg) by mouth 2 times daily. (Patient taking differently: Take 500 mg by mouth daily. ) 60 tablet 11   . valACYclovir (VALTREX) 500 MG tablet TAKE 1 TABLET BY MOUTH EVERY DAY [PLAN LIMITS TO 30 DAYS] 30 tablet 8   . vitamin D3 50 MCG (2000 UT) capsule by Oral route. Three times a week 5043mg       No current facility-administered medications for this visit.       Allergies: Patient has no known allergies.    Family History:   Family History   Problem Relation Name Age of Onset   . Diabetes Mother          HTN, DJD   . Neurological Disease (Parkinsons/Huntingtons/Huntingtons Chorea) Father          DM, alcoholism   . Diabetes Father     . No Known Problems Sister     . Thyroid Brother     . Cataract Maternal Grandfather     . Cataract Paternal Grandfather         Social History:   Social History     Socioeconomic History   . Marital status:  Married     Spouse name: Not on file   . Number of children: Not on file   . Years of education: Not on file   . Highest education level: Not on file   Occupational History   . Not on file   Tobacco Use   . Smoking status: Never Smoker   . Smokeless tobacco: Never Used   Substance and Sexual Activity   . Alcohol use: Not Currently   . Drug use: Never   . Sexual activity: Yes     Partners: Male   Social Activities of Daily Living Present   . Not on file   Social History Narrative   . Not on file        Review of Systems:     Per HPI, otherwise neg ROS     Objective:    BP 122/65 (BP Location: Left arm, BP Patient Position: Sitting, BP cuff size: Regular)   Pulse 79   Temp 98.1 F (36.7 C) (Temporal)   Ht 5' (1.524 m)   Wt 92.1 kg (203 lb 2 oz)   LMP 10/21/2020   SpO2 98%   Breastfeeding No   BMI 39.67 kg/m  Body mass index is 39.67 kg/m.    General: Well-developed, well-nourished  NAD.  HEENT: PERRL, EOMI, no scleral icterus. MMM, OP clear, b/l external ears normal, Neck without obvious deformity on inspection  CVS: RRR, no m/r/g.   Resp: Normal respiratory effort without accessory muscle use, CTAB no w/r/r.  GI: +BS, soft, NTND  Ext: WWP, no LE edema.  Skin: Warm and dry. No rashes or lesions noted.  Neuro: Alert and interactive, moving all extremities spontaneously, steady gait.   Psych: Appropriately ranging mood and affect. Appropriate responses to questions.     Labs:  Results for orders placed or performed during the hospital encounter of 09/28/20   Comprehensive Metabolic Panel   Result Value Ref Range    Sodium 139 136 - 145 mmol/L    Potassium 4.2 3.5 - 5.1 mmol/L    Chloride 105 98 - 107 mmol/L    CO2 28 21 - 31 mmol/L    Electrolyte Balance 6 2 - 12 mmol/L    Glucose 107 70 - 115 mg/dL    BUN 6 (L)  7 - 25 mg/dL    Creat 0.8 0.6 - 1.2 mg/dL    eGFR - low estimate >60 >59    eGFR - high estimate >60 >59    Calcium 9.1 8.6 - 10.3 mg/dL    Protein, Total 6.5 6.0 - 8.3 G/DL    Albumin 3.9 3.7 - 5.3  G/DL    Alk Phos 92 34 - 104 U/L    AST 22 13 - 39 U/L    ALT 20 7 - 52 U/L    Bilirubin, Total 0.3 0.0 - 1.4 mg/dL   CBC w/ Diff Lavender   Result Value Ref Range    White Bld Cell Count 9.2 4.0 - 10.5 THOUS/MCL    RBC 4.85 3.70 - 5.00 MILL/MCL    Hgb 13.7 11.5 - 15.0 G/DL    Hematocrit 41.2 34.0 - 44.0 %    MCV 85.0 81.5 - 97.0 FL    MCH 28.2 27.0 - 33.5 PG    MCHC 33.2 32.0 - 35.5 G/DL    RDW-CV 13.2 11.6 - 14.4 %    PLT Count 376 150 - 400 THOUS/MCL    MPV 8.8 7.2 - 11.7 FL    Diff Type DIFFERENTIAL PERFORMED BY AUTOMATED ANALYSIS     Neutrophils % (A) 53.8 %    ANC automated 5.0 2.0 - 8.1 THOUS/MCL    Lymphocytes % 36.0 %    Lymphocytes Absolute 3.3 0.9 - 3.3 THOUS/MCL    Monocytes % 6.4 %    Monocytes Absolute 0.6 0.0 - 0.8 THOUS/MCL    Eosinophils % 2.9 %    Eosinophils Absolute 0.3 0.0 - 0.5 THOUS/MCL    Basophils % 0.9 %    Basophils Absolute 0.1 0.0 - 0.2 THOUS/MCL       Lab Results   Component Value Date    HGBA1C 5.7 (H) 09/07/2019     Lab Results   Component Value Date    CHOL 190 05/19/2019     Lab Results   Component Value Date    CREAT 0.8 09/28/2020    BUN 6 (L) 09/28/2020    K 4.2 09/28/2020    CL 105 09/28/2020    CO2 28 09/28/2020     Lab Results   Component Value Date    ALT 20 09/28/2020    AST 22 09/28/2020     No results found for: TSH    Radiology:  Screening Mammogram With Digital Breast Tomosynthesis - Bilateral    Result Date: 04/27/2020  SCREENING MAMMOGRAM WITH DIGITAL BREAST TOMOSYNTHESIS  CLINICAL HISTORY: Patient is 50 years old and is seen for screening. No personal history of breast cancer.  No family history of breast cancer. No family history of ovarian cancer.  IBIS estimated lifetime risk: 9.3%  COMPARISON: No prior imaging studies are available for comparison.  TECHNIQUE: Imaging was performed with 3D tomosynthesis and 2D digital mammography. Computer aided detection R2 version 9.3 was used for 2D digital images.  VIEWS OBTAINED: bilateral craniocaudal, bilateral craniocaudal  3D, bilateral mediolateral oblique, bilateral mediolateral oblique 3D, bilateral exaggerated craniocaudal, bilateral exaggerated craniocaudal 3D, bilateral MLO anterior compression, and bilateral MLO anterior compression 3D.  MAMMOGRAM FINDINGS: There are scattered areas of fibroglandular density.  There is no suspicious mass, calcification or architectural distortion to suggest malignancy.       ACR BI-RADS Category 1 -  Negative.  RECOMMENDATION:  There is no mammographic evidence of malignancy.  A routine follow-up mammogram in 1 year is recommended unless  clinically indicated otherwise. Patient information has been entered into a reminder system with a target due date of 1 year.   I have personally reviewed the images upon which this report is based and agree with the findings and conclusions expressed above.         Assessment & Plan:    1. Insomnia, unspecified type  - Consult/Referral to Sleep Medicine; Future  - trial ramalteon     2. Dysthymic disorder  - Consult/Referral to Psychology    #Pemphigus vulgaris  Currently on IVIG, well controlled sxs- tapering.Was previously on steroids that was tapered off in 2020.     #Leukocytosis:   #thrombocytosis:  Likely secondary. Denies any systemic sxs including  Vasomotor sxs, signs of thrombosis, b symptoms, signs of infection.   - CBC  - smear     #BMI: 40, w hx of DM. Would likely benefit fr glp1 agonist. Pt has noted improvement with wellbutrin for anxiety, will add naltrexone for combination medicaition.     #diffuse pain: Discussed non pharm including physical therapy. Consider referral to rheum    #anxiety: has noted improvement w xanax, will switch to klonipin.     #. Routine Health Maintenance:  Health Maintenance   Topic Date Due   . Pneumococcal Vaccine (1 of 4 - PCV13) Never done   . Hepatitis C Screening  Never done   . HIV Screening  Never done   . Tetanus (1 - Tdap) Never done   . Colorectal Cancer Screening  Never done   . COVID-19 Vaccine (3 -  Pfizer risk 4-dose series) 12/21/2019   . PHQ9 Depression Monitoring ([facility])  12/15/2020   . Annual AUDIT Screen  04/21/2021   . Annual DAST Screen  04/21/2021   . Breast Cancer Screen  04/26/2021   . Cervical Cancer Screening  12/27/2021   . Influenza  Completed   . Polio Vaccine  Aged Out   . HPV Vaccine <= 26 Yrs  Aged Out   . Meningococcal MCV4 Vaccine  Aged Out       The above plan of care, diagnosis, orders, and follow-up were discussed with the patient.  Questions related to this recommended plan of care were answered.    Advised to return to clinic in 3 months or sooner PRN.    No future appointments.    Boyce Medici, MD 04/28/2020 1:09 PM

## 2020-11-01 ENCOUNTER — Encounter: Payer: Self-pay | Admitting: Internal Medicine

## 2020-11-05 ENCOUNTER — Encounter: Payer: Self-pay | Admitting: Internal Medicine

## 2020-11-05 DIAGNOSIS — R109 Unspecified abdominal pain: Secondary | ICD-10-CM

## 2020-11-06 ENCOUNTER — Encounter: Payer: Self-pay | Admitting: Internal Medicine

## 2020-11-15 ENCOUNTER — Encounter: Payer: Self-pay | Admitting: Internal Medicine

## 2020-11-19 ENCOUNTER — Encounter: Payer: Self-pay | Admitting: Internal Medicine

## 2020-11-19 DIAGNOSIS — G8929 Other chronic pain: Secondary | ICD-10-CM

## 2020-11-22 ENCOUNTER — Encounter: Payer: Self-pay | Admitting: Dermatology

## 2020-11-22 ENCOUNTER — Ambulatory Visit: Payer: No Typology Code available for payment source | Admitting: Dermatology

## 2020-11-22 VITALS — Temp 97.7°F

## 2020-11-22 DIAGNOSIS — Z79899 Other long term (current) drug therapy: Secondary | ICD-10-CM

## 2020-11-22 DIAGNOSIS — L100 Pemphigus vulgaris: Secondary | ICD-10-CM

## 2020-11-22 DIAGNOSIS — B009 Herpesviral infection, unspecified: Secondary | ICD-10-CM

## 2020-11-22 DIAGNOSIS — L918 Other hypertrophic disorders of the skin: Secondary | ICD-10-CM

## 2020-11-22 MED ORDER — VALACYCLOVIR HCL 500 MG OR TABS
500.0000 mg | ORAL_TABLET | Freq: Every day | ORAL | 3 refills | Status: DC
Start: 2020-11-22 — End: 2021-08-22

## 2020-11-22 NOTE — Progress Notes (Signed)
Department of Dermatology  [place]  PATIENT: Shaterria Sager  MRN: [id number]  DOB: 1971-06-12  DATE OF SERVICE: 11/22/2020    DERMATOLOGY FOLLOW-UP PATIENT VISIT     REFERRING PRACTITIONER: Boyce Medici  PRIMARY CARE PROVIDER: Boyce Medici  CHIEF COMPLAINT:   Chief Complaint   Patient presents with   . 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 Currently on cellcept 500 mg daily. No side effects, would like to taper off. No new lesions. No recurrent HSV either as has been on ppx since last visit, needs refill of this.     Would like some skin tags treated on neck.     Will be going to [place] in June.     No vulvovaginal discharge, pain, burning, itching, or other symptoms.    See prior note for full review.     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  Social History: Pt reports that she has never smoked. She has never used smokeless tobacco. She reports previous alcohol use. She reports that she does not use drugs.  Family History:Pt family history includes Cataract in her maternal grandfather and paternal grandfather; Diabetes in her father and mother; Neurological Disease (Parkinsons/Huntingtons/Huntingtons  Chorea) in her father; No Known Problems in her sister; Thyroid in her brother.  Home Medications: Pt has a current medication list which includes the following prescription(s): bupropion, clonazepam, clotrimazole-betamethasone, diphenhydramine, multivitamin, mycophenolate mofetil, naltrexone, valacyclovir, and vitamin d3.  Allergies: Patient has no known allergies.    Objective:   General: Pleasant, in no acute distress, appears stated age  Psychiatric: Mood and affect are within normal limits  Respiratory: No increased work of breathing     11/22/20  0946   Temp: 97.7 F (36.5 C)       Skin Exam:  Skin Type: IV  Patient declines a chaperone today  Pertinent skin findings below:  Skin exam performed including scalp, face, conjunctiva, lips, oral mucosa, hands, and the following was noted:    Patient denies vulvovaginal concerns or symptoms  No oral lesions or gingival erythema  Scattered skin colored to hyperpigmented pedunculated papules on neck     02/07/2019   Basement Membrane Zone (BMZ) IgG, IgG4, and IgA  IgG: Negative, monkey esophagus substrate     Negative, human split skin substrate      IgG4: Negative, monkey esophagus substrate        Negative, human split skin substrate   IgA: Negative, monkey esophagus substrate     Negative, human split skin substrate   Positive IgG, including IgG4, cell surface antibodies,         monkey esophagus substrate     Enzyme Linked Immunosorbent Assay (ELISA)   --------------------------------------------------   Bullous Pemphigoid (BP) 180 and 230 IgG Antibodies   IgG BP 180  antibodies: 1 unit  NEGATIVE  IgG BP 230 antibodies: 1 unit  NEGATIVE    02/07/2019  Cell Surface IgG Antibodies   IgG: Positive, titer 1:2560 (H), monkey esophagus substrate     Positive, titer 1:640 (H), intact human skin substrate     Enzyme-Linked Immunosorbent Assay (ELISA)  -----------------------------------------  Desmoglein (DSG) 1 and 3 IgG Antibodies   IgG  desmoglein 1 antibodies:  7 units  NEGATIVE    Reference Range:      Positive (H) = Greater than 20 units      Borderline/Indeterminate = 14-20 units      Negative = Less than 14 units   IgG desmoglein 3 antibodies: 320 units (H)  POSITIVE     (Initial level, 139 units, greater than high         calibrator; diluted to achieve 64 units, within      assay calibrators, and multiplied by the dilution      factor of 5)   COMMENTS   Specific   -------------------------------------------------   These indirect immunofluorescence results, demonstrating   positive IgG cell surface antibody reactivity on both   monkey esophagus and intact human skin substrates, support   the diagnosis of pemphigus.    Assessment/Plan     # Pemphigus vulgaris   # High risk medication monitoring  Reviewed prior notes (patient followed by Dr. Charm Barges) and prior labwork including serologies for pemphigus/pemphigoid, CBC, CMP. She was previously on rituxan. Was also on IVIg monthly but stopped in November due to difficulty tolerating infusions.   Has no active clinical findings today and is asymptomatic. Denies vulvovaginal involvement. Plan was for her to be on cellcept 1000 mg bid but she is on 500 mg daily and well-controlled (not on doxy and NCN) and prefers to stay on this regimen or discontinue if possible.  - Continue cellcept 500 mg daily for now. Discussed reasonable to consider discontinuing as she has no active disease and this is a very low dose.  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 Verbal consent obtained. Risks of infection, scarring, nerve damage, pigment change, numbness, incomplete removal, recurrence, bleeding, pain, and allergy anesthesia were all reviewed. Benefits and alternatives also discussed. The patient agreed to proceed.    The above plan of care, diagnosis, orders, and follow-up were discussed with the patient. Risks, benefits, alternatives to therapy were discussed. Questions related to this recommended plan of care were answered.    RTC in 3 months (prefers to schedule prior to trip in June)    Marcelle Overlie, MD

## 2020-11-23 ENCOUNTER — Inpatient Hospital Stay (INDEPENDENT_AMBULATORY_CARE_PROVIDER_SITE_OTHER)
Admit: 2020-11-23 | Discharge: 2020-11-23 | Disposition: A | Discharge status: Home Health | Payer: No Typology Code available for payment source | Attending: Internal Medicine | Admitting: Internal Medicine

## 2020-11-23 ENCOUNTER — Ambulatory Visit: Payer: No Typology Code available for payment source

## 2020-11-23 DIAGNOSIS — Z9049 Acquired absence of other specified parts of digestive tract: Secondary | ICD-10-CM

## 2020-11-23 DIAGNOSIS — Z87442 Personal history of urinary calculi: Secondary | ICD-10-CM

## 2020-11-23 DIAGNOSIS — R109 Unspecified abdominal pain: Secondary | ICD-10-CM

## 2020-11-29 ENCOUNTER — Encounter: Payer: Self-pay | Admitting: Internal Medicine

## 2020-11-29 ENCOUNTER — Encounter: Payer: Self-pay | Admitting: Dermatology

## 2020-11-30 ENCOUNTER — Ambulatory Visit: Payer: No Typology Code available for payment source | Admitting: Anesthesiology

## 2020-11-30 ENCOUNTER — Ambulatory Visit (HOSPITAL_BASED_OUTPATIENT_CLINIC_OR_DEPARTMENT_OTHER)
Admit: 2020-11-30 | Discharge: 2020-11-30 | Disposition: A | Discharge status: Home Health | Payer: No Typology Code available for payment source

## 2020-11-30 ENCOUNTER — Ambulatory Visit
Admission: RE | Admit: 2020-11-30 | Discharge: 2020-11-30 | Disposition: A | Discharge status: Home / Self Care | Payer: No Typology Code available for payment source | Attending: Anesthesiology | Admitting: Anesthesiology

## 2020-11-30 VITALS — BP 117/79 | HR 87 | Temp 98.5°F | Resp 16 | Ht 60.0 in | Wt 205.0 lb

## 2020-11-30 DIAGNOSIS — M170 Bilateral primary osteoarthritis of knee: Secondary | ICD-10-CM | POA: Insufficient documentation

## 2020-11-30 DIAGNOSIS — M25542 Pain in joints of left hand: Secondary | ICD-10-CM

## 2020-11-30 DIAGNOSIS — M79641 Pain in right hand: Secondary | ICD-10-CM | POA: Insufficient documentation

## 2020-11-30 DIAGNOSIS — M25541 Pain in joints of right hand: Secondary | ICD-10-CM

## 2020-11-30 DIAGNOSIS — M7918 Myalgia, other site: Secondary | ICD-10-CM

## 2020-11-30 DIAGNOSIS — M2550 Pain in unspecified joint: Secondary | ICD-10-CM

## 2020-11-30 DIAGNOSIS — Z6841 Body Mass Index (BMI) 40.0 and over, adult: Secondary | ICD-10-CM | POA: Insufficient documentation

## 2020-11-30 DIAGNOSIS — M25562 Pain in left knee: Secondary | ICD-10-CM

## 2020-11-30 DIAGNOSIS — M79642 Pain in left hand: Secondary | ICD-10-CM | POA: Insufficient documentation

## 2020-11-30 DIAGNOSIS — G8929 Other chronic pain: Secondary | ICD-10-CM | POA: Insufficient documentation

## 2020-11-30 DIAGNOSIS — M25561 Pain in right knee: Secondary | ICD-10-CM | POA: Insufficient documentation

## 2020-11-30 MED ORDER — DICLOFENAC SODIUM 1 % EX GEL
2.0000 g | Freq: Four times a day (QID) | CUTANEOUS | 3 refills | Status: DC | PRN
Start: 2020-11-30 — End: 2021-02-14

## 2020-11-30 MED ORDER — MISCELLANEOUS COMPOUNDED MEDICATION - NON CONTROLLED
3 refills | Status: DC
Start: 2020-11-30 — End: 2020-12-21

## 2020-11-30 MED ORDER — PREGABALIN 50 MG OR CAPS
50.0000 mg | ORAL_CAPSULE | Freq: Every evening | ORAL | 3 refills | Status: DC
Start: 2020-11-30 — End: 2020-12-28

## 2020-11-30 NOTE — Telephone Encounter (Addendum)
Pt would like to check with you if she should get a booster dose.    Please advise

## 2020-11-30 NOTE — Progress Notes (Signed)
[place]    Initial Consultation    Date of Service: 11/30/2020    Referring Physician: Boyce Medici  PCP: Boyce Medici    Thank you for the opportunity to see your patient Anita Turner at the [facility] at [facility] [place], [place].    Chief Complaint: Widespread pain in the joints    History of Present Illness:  As you know, Anita Turner is a 50 year old female pemphigus vulgaris and pemphigus foliaceous, currently treated by dermatology with [facility]. She was previously treating in [place], and was initially prescribed Prednisone for approximately a year and a half. It was recommended she start tapering down on Prednisone, and during the process, started noting more pain in a widespread fashion in her joints. The pain is worse in the morning and at night time. Her pain is improved with rest and Tylenol or Advil as needed. Her pain is average 8/10 and described as aching, cramping, and tender. She has had no new imaging or physical therapy to date. She does, however do Tai Chi and Yoga daily, which helps. Her pcp prescribed Naltrexone 12.99m and titrated to 556mbut caused worsening joint pain and constipation, and thus stopped the medication. Patient denies any history of prescribed medication or illicit drug drug abuse, misuse, addiction, diversion, or overdose. They also deny any known history of depressed or abnormal breathing patterns. She has poor sleep as well. Reports well controlled mood at this time.    Pain location:widespread joint pain  Pain radiation: diffuse  Inciting event: none  Duration: since 2016  Pain Quality: aching, cramping, tender  VRS score:  Average 8/10, Best 8/10, Worst 8/10  Weakness: No   Numbness/tingling: No  Exacerbating factors: Activity, standing, walking  Alleviating factors:  Rest, Medications   Anticoagulation: No   Diabetes: No   Denies bowel or bladder  incontinence or saddle anesthesia.     FUNCTIONALITY: Independent    Workerman's Compensation: No   Disability Status: No   Is the patient currently involved in ligation with the injury: No     Pain Management History:    A. Specialists Seen:    PCP, dermatology  B. Trialed Pain Medications:     Naltrexone, Tylenol, Ibuprofen  C. Current Pain Medications:     Tylenol and Ibuprofen   D. Previous Investigations Performed:     None  E. Non-interventional Techniques:      PT for the knee only  (PT, TENS, acupuncture)  F. Interventional Techniques:   None    Past Medical History:   Patient Active Problem List   Diagnosis   . Pemphigus vulgaris   . Pemphigus foliaceus   . Strain of muscle of right hip   . Abnormal EKG   . Chondromalacia of right patella   . Obesity with body mass index 30 or greater   . Class 2 obesity due to excess calories without serious comorbidity with body mass index (BMI) of 38.0 to 38.9 in adult   . Disorder of vitamin B12   . Ingrowing toenail   . Osteoarthritis of knee   . Pain of right thumb   . Patellofemoral stress syndrome   . Prediabetes   . Foot pain   . Shortness of breath   . Synovitis   . Nephrolithiasis   . Need for vaccination   . Vitreomacular adhesion of both eyes   . Age-related nuclear cataract of  both eyes        Past Surgical History:   Past Surgical History:   Procedure Laterality Date   . APPENDECTOMY  2002   . CHOLECYSTECTOMY  2017        Allergies: No Known Allergies     Current Medications:   Current Outpatient Medications on File Prior to Visit   Medication Sig Dispense Refill   . buPROPion (WELLBUTRIN XL) 150 MG XL tablet Take 1 tablet (150 mg) by mouth every morning. 90 tablet 3   . clonazePAM (KLONOPIN) 0.5 MG tablet Take 1 tablet (0.5 mg) by mouth 2 times daily as needed for Anxiety. 10 tablet 0   . clotrimazole-betamethasone (LOTRISONE) 1-0.05 % cream Apply 1 Application topically 2 times daily. Use a small amount as directed to armpits 1 Tube 11   . diphenhydrAMINE  (BENADRYL) 50 MG/ML injection Please give 50 mg IV push prior to Rituxan infusions.  If needed may give a repeat 25 mg IV push dose. 1 vial 8   . Multiple Vitamin (MULTIVITAMIN) capsule Take 1 capsule by mouth daily. 30 tablet 0   . mycoPHENOLate mofetil (CELLCEPT) 500 MG tablet Take 2 tablets (1,000 mg) by mouth 2 times daily. (Patient taking differently: Take 500 mg by mouth daily.) 60 tablet 11   . naltrexone (DEPADE) 50 MG tablet Take 1 tablet (50 mg) by mouth daily. 30 tablet 0   . valACYclovir (VALTREX) 500 MG tablet Take 1 tablet (500 mg) by mouth daily. 90 tablet 3   . vitamin D3 50 MCG (2000 UT) capsule by Oral route. Three times a week 5022mg       No current facility-administered medications on file prior to visit.         Social History:   Social History     Socioeconomic History   . Marital status: Married     Spouse name: Not on file   . Number of children: Not on file   . Years of education: Not on file   . Highest education level: Not on file   Occupational History   . Not on file   Tobacco Use   . Smoking status: Never Smoker   . Smokeless tobacco: Never Used   Substance and Sexual Activity   . Alcohol use: Not Currently   . Drug use: Never   . Sexual activity: Yes     Partners: Male   Other Topics Concern   . Not on file   Social History Narrative   . Not on file     Social Determinants of Health     Financial Resource Strain: Not on file   Food Insecurity: Not on file   Transportation Needs: Not on file   Physical Activity: Not on file   Stress: Not on file   Social Connections: Not on file   Intimate Partner Violence: Not on file   Housing Stability: Not on file       Tobacco use: No    Cannabis use: No    Alcohol use: No   Recreational drug use: No    Exposure to toxins/poisonous substances at work or with hobbies: No   Occupation: HAgricultural engineer  Education: High school   Marital Status: Married  PRisk managerdenies any history of physical abuse, sexual abuse, abandonment by caregivers, alcohol or  drug use by caregivers, and emotional neglect/abuse. n/a                Family History:  Family History   Problem Relation Age of Onset   . Diabetes Mother         HTN, DJD   . Neurological Disease (Parkinsons/Huntingtons/Huntingtons Chorea) Father         DM, alcoholism   . Diabetes Father    . No Known Problems Sister    . Thyroid Brother    . Cataract Maternal Grandfather    . Cataract Paternal Grandfather       Pertinent family history includes above.     Review Of Systems:   A fourteen point review of systems performed and was otherwise negative for the following:  Constitutional:  fever, weight loss, fatigue, night sweats.   Eyes: blurry vision, double vision, eye pain, redness, vision loss.   Ear, Nose, Throat:  trouble hearing, tinnitus, ear pain, loss of balance.  Cardiovascular:  chest pain, palpitations, irregular heart beat.   Respiratory: difficulty breathing, chronic cough, coughing up blood.  Gastrointestinal:  indigestion, nausea, vomiting, diarrhea, constipation, abdominal pain.  Genitourinary:  incontinence, impotence, pain on urination, pain on sexual relations.  Musculoskeletal: see HPI   Skin/breast: skin changes, hair loss.  Neurological:  headache, facial pain, weakness, tremors, memory changes, seizures.  Psychiatric:  depression, suicidal ideation, hallucinations.   Hematological/lymphatic: abnormal bleeding, anemia, lumps or swelling.   Allergic/Immunologic: skin rash, joint pain, dry eyes/mouth.   Endocrine: excessive thirst, urination, hot/cold intolerance  With the exception of the following items:  Night sweats, fatigue, ringing in ears, limb pain on walking, heartburn, constipation, abdominal pain, joint pain, joint stiffness, back pain, joint swelling, weakness, feeling depressed, trouble sleeping, joint pain, dry eyes and/or dry mouth.    Physical Exam:    11/30/20  0920   BP: 117/79   Pulse: 87   Resp: 16   Temp: 98.5 F (36.9 C)      General: No apparent distress. Pleasant. Well  appearing. The patient does not appear sedated.  HEENT: Pupils equal and round, normocephalic, atraumatic. Symmetric facial muscles.   Pulmonary: Good chest wall excursion. Breathing unlabored.  Cardiovascular: Well perfused. No peripheral cyanosis.  Abdomen: Soft, non tender, nondistended  Psych: Appropriate affect and insight, non-pressured speech. No evidence of aberrant behavior.  Skin: No rashes or lesions. No allodynia.    Neuromuscular Exam: Upon initial assessment, the patient is seated in a neutral position.     Inspection: Back and extremities are symmetric and aligned. Muscle bulk is normal in appearance.     Palpation: There is no tenderness over the spinous processes and paravertebral musculature bilaterally.     The patient ambulates unassisted; gait is not antalgic. Leg length is grossly symmetric.     Lumbar ROM to flexion and extension is: full   Cervical ROM to flexion and extension is: full    Sensation to light touch is intact C5-T1 and L2-S1.    Upper Extremities:    MOTOR  STRENGTH  Shoulder  abduction Biceps flexion  Triceps  extension  Wrist extension Finger abduction   Right 5/5 5/5 5/5 5/5 5/5   Left 5/5 5/5 5/5 5/5 5/5     DTRs Biceps Triceps   Right 2+ 2+   Left 2+  2+        Lower Extremities:     MOTOR  STRENGTH   Hip   flexion  Knee  extension  Knee flexion  Ankle dorsiflexion Ankle plantarflex   EHL   Right 5/5 5/5 5/5 5/5 5/5 5/5   Left 5/5 5/5 5/5  5/5 5/5 5/5       DTRs Patella Achilles   Right 2+ 2+   Left 2+ 2+       Upper Motor Neuron Signs Upper Extremity Lower  Extremity   Right Absent Absent   Left Absent Absent       Special tests:   Bilateral knee exam: full range of motion with crepitus. Positive joint line tenderness  Bilateral hands: no swelling at DIP and PIP joints with full range of motion    Diagnostic Data: None    Impression:    ICD-10-CM ICD-9-CM   1. Joint pain in both hands  M25.541 719.44    M25.542    2. Chronic pain of both knees  M25.561 719.46    M25.562  338.29    G89.29    3. Pain in joint involving multiple sites  M25.50 719.49   4. Myofascial pain  M79.18 729.1       Assessment and Plan: Anita Turner is a 50 year old female with pemphigus vulgaris and pemphigus foliaceous, currently treated by dermatology with [facility]. She was previously treating in [place], and was initially prescribed Prednisone for approximately a year and a half. It was recommended she start tapering down on Prednisone, and during the process, started noting more pain in a widespread fashion in her joints. She consulted with Rheumatology in [place], but was a one time consult and no further work up and treatment was advised. Her pain is typically 8/10 and worse in the morning. She takes Tylenol or Advil as needed for pain, which helps. She currently takes a zoom Yoga class online daily and [place] as well. Physical examination today revealed full lumbar and cervical range of motion. There was diffuse tenderness in the cervical paraspinals, trapezius, rhomboids, trapezius, thoracic and lumbar paraspinals. Bilateral knee exam revealed full range of motion with crepitus and joint line tenderness. She had full strength and range of motion in both upper and lower extremities. For treatment at this time, we recommend X-rays of bilateral hands and knees, and will place a referral to physical therapy. Will also refer to Rheumatology for widespread joint pain. In terms of medications, will start Lyrica 46m nightly and Low Dose Naltrexone 4.525monce daily for widespread pain. Of note, she reports day time somnolence, abdominal issues, and sleep disturbances, which would include Fibromyalgia as a possible diagnosis.    Based on the patient's history, physical examination, and review of the available diagnostic data our multimodal recommendations include:    Medications:      For non-opioid therapy, the following medications were prescribed:  - Start Lyrica 5059mightly  - Start LDN compounded  4.5mg40mce daily (stop the 50 mg dose)    Opioid therapy:  - None    All medication profiles were outlined for the patient including potential benefits and expected side effects. Titration schedules were also outlined.    Interventions:  None at this time    Imaging:  X-rays of the hands and knees ordered today    Behavioral Therapies:  Discussed non-opioid modalities including:    Pain Psychology (Biofeedback, CBT, Counseling)  Complementary and Alternative Medicine (Acupuncture)  Rehab Therapies (PT, OT)  DME/Bracing/Orthotics/Off-Loading  Massage Therapy  Chiropractic Care  Aquatherapy  Self-Care/Wellness  Home Exercise Program   Relaxation Therapies (Mindfulness, Meditation)  Yoga  Ergonomic Modifications/Counseling  Weight Loss (Nutrition counseling, Diet Modifications)  Low Impact Aerobic Training   Sleep Hygiene  Smoking Cessation    Referrals:  1. PT  2. Pain psychology  3. Rheumatology    Controlled Substance Management Tools:     Prescription Drug Monitoring:  A CURES report was obtained and was reviewed with resident/fellow and treatment team.      The patient screened negative for alcohol/drug misuse on the SBIRT.     Screening for Clinical Depression: The patient was screened for depression.    _0 @   [facility] 09/17/2020 09/16/2020 09/13/2020 05/21/2020   PHQ2 Total _1 Patient was offered intervention where appropriate.    Multi-modal Pain Therapy: The patient was explicitly considered for multimodal and interdisciplinary therapy. Non-opioid and non-pharmacologic opportunities to enhance analgesia and quality of life were discussed.      Medications may cause sedation and impairment of judgment. The patient is advised against driving or using heavy machinery. The patient is also advised against using alcohol or other substances that may have a further additive effect. Shared decision making utilized to continue current treatment plan and patient's questions were answered.     Follow-Up Plan:   2-4 weeks with Dr. Veverly Fells, PA-C  The supervising physician was physically present in the office suite and immediately available when services were rendered.    Anita Turner was seen today for consultation.    Diagnoses and all orders for this visit:    Joint pain in both hands  -     Consult/Referral to Rheumatology  -     X-Ray Hand Minimum 3 Views - Bilateral; Future    Chronic pain of both knees  -     X-Ray Knee 3 Views - Bilateral; Future  -     [facility]    Pain in joint involving multiple sites  -     miscellaneous compounded medication - non-controlled; Low Dose Naltrexone 4.72m one tab by mouth every morning  -     diclofenac (VOLTAREN) 1 % gel; Apply 2 g topically every 6 hours as needed (pain).  -     Consult/Referral to Pain - Adult Psychology    Myofascial pain  -     miscellaneous compounded medication - non-controlled; Low Dose Naltrexone 4.530mone tab by mouth every morning  -     [facility]    Other orders  -     pregabalin (LYRICA) 50 MG capsule; Take 1 capsule (50 mg) by mouth at bedtime.      ATTENDING SUPERVISION NOTE:    I have reviewed the patient's medical history, interviewed, [facility] I have discussed my findings and recommendations with the patient as well as the medical student/PA/resident/fellow. I have reviewed and edited the medical student/PA/resident/fellow's note including the history, medications, and physical examination.  I personally formulated the assessment and plan. Please see the note for further details. Two identifiers were used to verify the identity of the patient.         COUNSELING & COORDINATION OF CARE:    This visit involved counseling and coordination of care that comprised more than 50% of the visit time.       ATTENDING ASSESSMENT:    PaLorece Keachs a 89ear old female who presents with a chief complaint of diffuse joint pain consistent with the diagnosis  of OA vs fibromyalgia vs rheumatologic arthritis after my review of all relevant medical data and in conjunction with my  physical exam findings. Our plan at this time includes refer to PT and pain psychology, low dose naloxone, trial of lyrica nightly, imaging.      Doree Fudge, MD     11/30/2020

## 2020-11-30 NOTE — Patient Instructions (Addendum)
Lyrica: start 50mg  at bedtime

## 2020-11-30 NOTE — Telephone Encounter (Addendum)
Pt would like to know if she is suppose to get a shingles vaccine.    Please advise.

## 2020-12-03 ENCOUNTER — Telehealth: Payer: Self-pay | Admitting: Anesthesiology

## 2020-12-03 ENCOUNTER — Ambulatory Visit: Payer: No Typology Code available for payment source | Admitting: Nurse Practitioner

## 2020-12-03 NOTE — Telephone Encounter (Signed)
Called pharmacy and LVM.  MZ

## 2020-12-03 NOTE — Telephone Encounter (Signed)
[employer] called stating that patient would like to get the 90 pills at once instead of the 30 pills with 3 refills.  Call back [phone]

## 2020-12-04 NOTE — Telephone Encounter (Signed)
I called pt's pharmacy and LVM informing that only 30 days at a time can be dispense.  MZ

## 2020-12-05 ENCOUNTER — Encounter: Payer: Self-pay | Admitting: Dermatology

## 2020-12-05 ENCOUNTER — Ambulatory Visit: Payer: No Typology Code available for payment source

## 2020-12-05 NOTE — Interdisciplinary (Signed)
Pt is here for Pfizer 3rd dose.  MYepes LVN

## 2020-12-05 NOTE — Telephone Encounter (Signed)
Spoke with pt, I apologize for my mistake and I informed her that we can see her on 03/14/21 at 10:20 am.    Pt agreed to re-schedule her appointment to 03/14/21.      Pt was grateful for the called.

## 2020-12-07 ENCOUNTER — Encounter: Payer: Self-pay | Admitting: Dermatology

## 2020-12-19 ENCOUNTER — Encounter: Payer: Self-pay | Admitting: Anesthesiology

## 2020-12-21 ENCOUNTER — Encounter: Payer: Self-pay | Admitting: Anesthesiology

## 2020-12-21 ENCOUNTER — Ambulatory Visit: Payer: No Typology Code available for payment source | Attending: Anesthesiology | Admitting: Anesthesiology

## 2020-12-21 VITALS — BP 132/76 | HR 82 | Temp 97.7°F | Resp 20 | Ht 60.0 in | Wt 205.0 lb

## 2020-12-21 DIAGNOSIS — M25542 Pain in joints of left hand: Secondary | ICD-10-CM

## 2020-12-21 DIAGNOSIS — G8929 Other chronic pain: Secondary | ICD-10-CM

## 2020-12-21 DIAGNOSIS — M25541 Pain in joints of right hand: Secondary | ICD-10-CM | POA: Insufficient documentation

## 2020-12-21 DIAGNOSIS — M170 Bilateral primary osteoarthritis of knee: Secondary | ICD-10-CM

## 2020-12-21 DIAGNOSIS — M2550 Pain in unspecified joint: Secondary | ICD-10-CM | POA: Insufficient documentation

## 2020-12-21 DIAGNOSIS — M25562 Pain in left knee: Secondary | ICD-10-CM

## 2020-12-21 DIAGNOSIS — M7918 Myalgia, other site: Secondary | ICD-10-CM | POA: Insufficient documentation

## 2020-12-21 MED ORDER — MISCELLANEOUS COMPOUNDED MEDICATION - NON CONTROLLED
1 refills | Status: DC
Start: 2020-12-21 — End: 2021-03-27

## 2020-12-21 NOTE — Progress Notes (Signed)
[place]    Follow UP Visit    Date of Service: 12/21/2020    Referring Physician: Boyce Medici  PCP: Boyce Medici    Thank you for the opportunity to see your patient Anita Turner at the [facility] at [facility] [place], [place].    Chief Complaint: Widespread pain in the joints    History of Present Illness:  As you know, Anita Turner is a 50 year old female pemphigus vulgaris and pemphigus foliaceous, currently treated by dermatology with [facility]. She was previously treating in [place], and was initially prescribed Prednisone for approximately a year and a half. It was recommended she start tapering down on Prednisone, and during the process, started noting more pain in a widespread fashion in her joints. The pain is worse in the morning and at night time. Her pain is improved with rest and Tylenol or Advil as needed. Her pain is average 8/10 and described as aching, cramping, and tender. She has had no new imaging or physical therapy to date. She does, however do Tai Chi and Yoga daily, which helps. Her pcp prescribed Naltrexone 12.29m and titrated to 513mbut caused worsening joint pain and constipation, and thus stopped the medication. Patient denies any history of prescribed medication or illicit drug drug abuse, misuse, addiction, diversion, or overdose. They also deny any known history of depressed or abnormal breathing patterns. She has poor sleep as well. Reports well controlled mood at this time.    Pain location:widespread joint pain  Pain radiation: diffuse  Inciting event: none  Duration: since 2016  Pain Quality: aching, cramping, tender  VRS score:  Average 8/10, Best 8/10, Worst 8/10  Weakness: No   Numbness/tingling: No  Exacerbating factors: Activity, standing, walking  Alleviating factors:  Rest, Medications   Anticoagulation: No   Diabetes: No   Denies bowel or bladder  incontinence or saddle anesthesia.     Interval History Today 12/21/20:  The patient presents for follow up. She states her pain is confined to bilateral knees since starting the Lyrica nightly. The pain intensity also improved to 4/10. She started noticing new clicking noise from her R knee when getting up. She also noted new onset depression and generalized pain whenever she took Naltrexone and stopped taking it. She says the symptoms have since resolved and she does not correlate it to lyrica use, we outlined this as a potential side effect and to stop it if symptoms return. She has not worked with PT or seen Rheumatology yet.    Pain:4/10  Location: B/L Knee (R>L)  Radiation: none  Quality: sharp  Modifying Factors: walking, standing  Denies bowel or bladder incontinence or saddle anesthesia.  Functionality: independent, mildly limited ADLs        FUNCTIONALITY: Independent    Workerman's Compensation: No   Disability Status: No   Is the patient currently involved in ligation with the injury: No     Pain Management History:    A. Specialists Seen:    PCP, dermatology  B. Trialed Pain Medications:     Naltrexone, Tylenol, Ibuprofen  C. Current Pain Medications:     Tylenol and Ibuprofen   D. Previous Investigations Performed:     None  E. Non-interventional Techniques:      PT for the knee only  (PT, TENS, acupuncture)  F. Interventional Techniques:   None    Past Medical History:   Patient Active  Problem List   Diagnosis   . Pemphigus vulgaris   . Pemphigus foliaceus   . Strain of muscle of right hip   . Abnormal EKG   . Chondromalacia of right patella   . Obesity with body mass index 30 or greater   . Class 2 obesity due to excess calories without serious comorbidity with body mass index (BMI) of 38.0 to 38.9 in adult   . Disorder of vitamin B12   . Ingrowing toenail   . Osteoarthritis of knee   . Pain of right thumb   . Patellofemoral stress syndrome   . Prediabetes   . Foot pain   . Shortness of breath   .  Synovitis   . Nephrolithiasis   . Need for vaccination   . Vitreomacular adhesion of both eyes   . Age-related nuclear cataract of both eyes        Past Surgical History:   Past Surgical History:   Procedure Laterality Date   . APPENDECTOMY  2002   . CHOLECYSTECTOMY  2017        Allergies: No Known Allergies     Current Medications:   Current Outpatient Medications on File Prior to Visit   Medication Sig Dispense Refill   . buPROPion (WELLBUTRIN XL) 150 MG XL tablet Take 1 tablet (150 mg) by mouth every morning. 90 tablet 3   . clonazePAM (KLONOPIN) 0.5 MG tablet Take 1 tablet (0.5 mg) by mouth 2 times daily as needed for Anxiety. 10 tablet 0   . clotrimazole-betamethasone (LOTRISONE) 1-0.05 % cream Apply 1 Application topically 2 times daily. Use a small amount as directed to armpits 1 Tube 11   . diclofenac (VOLTAREN) 1 % gel Apply 2 g topically every 6 hours as needed (pain). 1 each 3   . diphenhydrAMINE (BENADRYL) 50 MG/ML injection Please give 50 mg IV push prior to Rituxan infusions.  If needed may give a repeat 25 mg IV push dose. 1 vial 8   . [DISCONTINUED] miscellaneous compounded medication - non-controlled Low Dose Naltrexone 4.86m one tab by mouth every morning 30 capsule 3   . Multiple Vitamin (MULTIVITAMIN) capsule Take 1 capsule by mouth daily. 30 tablet 0   . mycoPHENOLate mofetil (CELLCEPT) 500 MG tablet Take 2 tablets (1,000 mg) by mouth 2 times daily. (Patient taking differently: Take 500 mg by mouth daily.) 60 tablet 11   . pregabalin (LYRICA) 50 MG capsule Take 1 capsule (50 mg) by mouth at bedtime. 30 capsule 3   . valACYclovir (VALTREX) 500 MG tablet Take 1 tablet (500 mg) by mouth daily. 90 tablet 3   . vitamin D3 50 MCG (2000 UT) capsule by Oral route. Three times a week 500101m       No current facility-administered medications on file prior to visit.         Social History:   Social History     Socioeconomic History   . Marital status: Married     Spouse name: Not on file   . Number of  children: Not on file   . Years of education: Not on file   . Highest education level: Not on file   Occupational History   . Not on file   Tobacco Use   . Smoking status: Never Smoker   . Smokeless tobacco: Never Used   Substance and Sexual Activity   . Alcohol use: Not Currently   . Drug use: Never   . Sexual activity: Yes  Partners: Male   Other Topics Concern   . Not on file   Social History Narrative   . Not on file     Social Determinants of Health     Financial Resource Strain: Not on file   Food Insecurity: Not on file   Transportation Needs: Not on file   Physical Activity: Not on file   Stress: Not on file   Social Connections: Not on file   Intimate Partner Violence: Not on file   Housing Stability: Not on file       Tobacco use: No    Cannabis use: No    Alcohol use: No   Recreational drug use: No    Exposure to toxins/poisonous substances at work or with hobbies: No   Occupation: Agricultural engineer   Education: High school   Marital Status: Married  Risk manager denies any history of physical abuse, sexual abuse, abandonment by caregivers, alcohol or drug use by caregivers, and emotional neglect/abuse. n/a                Family History:   Family History   Problem Relation Age of Onset   . Diabetes Mother         HTN, DJD   . Neurological Disease (Parkinsons/Huntingtons/Huntingtons Chorea) Father         DM, alcoholism   . Diabetes Father    . No Known Problems Sister    . Thyroid Brother    . Cataract Maternal Grandfather    . Cataract Paternal Grandfather       Pertinent family history includes above.     Review Of Systems:   A fourteen point review of systems performed and was otherwise negative for the following:  Constitutional:  fever, weight loss, fatigue, night sweats.   Eyes: blurry vision, double vision, eye pain, redness, vision loss.   Ear, Nose, Throat:  trouble hearing, tinnitus, ear pain, loss of balance.  Cardiovascular:  chest pain, palpitations, irregular heart beat.   Respiratory:  difficulty breathing, chronic cough, coughing up blood.  Gastrointestinal:  indigestion, nausea, vomiting, diarrhea, constipation, abdominal pain.  Genitourinary:  incontinence, impotence, pain on urination, pain on sexual relations.  Musculoskeletal: see HPI   Skin/breast: skin changes, hair loss.  Neurological:  headache, facial pain, weakness, tremors, memory changes, seizures.  Psychiatric:  depression, suicidal ideation, hallucinations.   Hematological/lymphatic: abnormal bleeding, anemia, lumps or swelling.   Allergic/Immunologic: skin rash, joint pain, dry eyes/mouth.   Endocrine: excessive thirst, urination, hot/cold intolerance  With the exception of the following items:  Night sweats, fatigue, ringing in ears, limb pain on walking, heartburn, constipation, abdominal pain, joint pain, joint stiffness, back pain, joint swelling, weakness, feeling depressed, trouble sleeping, joint pain, dry eyes and/or dry mouth.    Physical Exam:    12/21/20  1321   BP: 132/76   Pulse: 82   Resp: 20   Temp: 97.7 F (36.5 C)      General: No apparent distress. Pleasant. Well appearing. The patient does not appear sedated.  HEENT: Pupils equal and round, normocephalic, atraumatic. Symmetric facial muscles.   Pulmonary: Good chest wall excursion. Breathing unlabored.  Cardiovascular: Well perfused. No peripheral cyanosis.  Abdomen: Soft, non tender, nondistended  Psych: Appropriate affect and insight, non-pressured speech. No evidence of aberrant behavior.  Skin: No rashes or lesions. No allodynia.    Neuromuscular Exam: Upon initial assessment, the patient is seated in a neutral position.     Inspection: Back and extremities are symmetric  and aligned. Muscle bulk is normal in appearance.     Palpation: There is no tenderness over the spinous processes and paravertebral musculature bilaterally.     The patient ambulates unassisted; gait is not antalgic. Leg length is grossly symmetric.     Lumbar ROM to flexion and extension  is: full   Cervical ROM to flexion and extension is: full    Sensation to light touch is intact C5-T1 and L2-S1.    Upper Extremities:    MOTOR  STRENGTH  Shoulder  abduction Biceps flexion  Triceps  extension  Wrist extension Finger abduction   Right 5/5 5/5 5/5 5/5 5/5   Left 5/5 5/5 5/5 5/5 5/5     DTRs Biceps Triceps   Right 2+ 2+   Left 2+  2+        Lower Extremities:     MOTOR  STRENGTH   Hip   flexion  Knee  extension  Knee flexion  Ankle dorsiflexion Ankle plantarflex   EHL   Right 5/5 5/5 5/5 5/5 5/5 5/5   Left 5/5 5/5 5/5 5/5 5/5 5/5       DTRs Patella Achilles   Right 2+ 2+   Left 2+ 2+       Upper Motor Neuron Signs Upper Extremity Lower  Extremity   Right Absent Absent   Left Absent Absent       Special tests:   Bilateral knee exam: full range of motion with crepitus. Positive joint line tenderness  Bilateral hands: no swelling at DIP and PIP joints with full range of motion    Diagnostic Data:     XR B/L HAND 11/30/20  FINDINGS:    AP, lateral, and oblique views of each hand were obtained.     There is no evidence of displaced acute fracture or dislocation.    No evidence of an aggressive appearing periostitis.  No gross active appearing periarticular erosions are identified.  There is subtle suggestion of periarticular osteopenia of the bilateral hands and  No evidence of dystrophic appearing soft tissue calcifications.    IMPRESSION:     Periarticular osteopenia of the bilateral hands, which is nonspecific in etiology, however can be seen in early or subtle inflammatory arthropathy. Correlation with patient's clinical laboratory testing and physical examination may be helpful for further assessment.    XR B/L KNEE 11/30/20  FINDINGS:  Standing frontal, Merchant, and lateral views of the bilateral knees are presented for interpretation.     There is no evidence of displaced acute fracture or knee joint dislocation. There is moderate to severe medial compartment narrowing of the right knee. There  is subchondral sclerosis at the medial tibial plateau and moderately prominent marginal osteophytes at the right knee femoral-tibial joint. There is an approximate 5 mm lateral subluxation of the right tibial plateau relative to the distal right femur. There is moderate appearing medial compartment narrowing of the left knee, with moderately prominent medial and lateral marginal osteophytes at the femoral-tibial joint. There is a central osteophyte from the weightbearing aspect of the bilateral lateral femoral condyles. There is compensatory widening of the lateral compartments of the bilateral knees with associated mild varus deformity. There is moderate appearing degenerative change of the bilateral patellofemoral articulations. No significant appearing joint effusion is identified.        IMPRESSION:    Moderate to severe appearing degenerative change of the right knee predominantly in the basis of medial compartment degeneration.    Moderate appearing  degenerative change of the left knee.      Impression:    ICD-10-CM ICD-9-CM   1. Left knee pain, unspecified chronicity  M25.562 719.46   2. Bilateral primary osteoarthritis of knee  M17.0 715.16   3. Joint pain in both hands  M25.541 719.44    M25.542    4. Chronic pain of both knees  M25.561 719.46    M25.562 338.29    G89.29    5. Pain in joint involving multiple sites  M25.50 719.49   6. Myofascial pain  M79.18 729.1       Assessment and Plan: Anita Turner is a 50 year old female with pemphigus vulgaris and pemphigus foliaceous, currently treated by dermatology with [facility]. She was previously treating in [place], and was initially prescribed Prednisone for approximately a year and a half. It was recommended she start tapering down on Prednisone, and during the process, started noting more pain in a widespread fashion in her joints. She consulted with Rheumatology in [place], but was a one time consult and no further work up and treatment was  advised. Her pain is typically 8/10 and worse in the morning. She takes Tylenol or Advil as needed for pain, which helps. She currently takes a zoom Yoga class online daily and [place] as well. Physical examination today revealed full lumbar and cervical range of motion. There was diffuse tenderness in the cervical paraspinals, trapezius, rhomboids, trapezius, thoracic and lumbar paraspinals. Bilateral knee exam revealed full range of motion with crepitus and joint line tenderness. She had full strength and range of motion in both upper and lower extremities.   For treatment at this time, we recommend X-rays of bilateral hands and knees, and will place a referral to physical therapy. Will also refer to Rheumatology for widespread joint pain. In terms of medications, will start Lyrica 10m nightly and Low Dose Naltrexone 4.558monce daily for widespread pain. Of note, she reports day time somnolence, abdominal issues, and sleep disturbances, which would include Fibromyalgia as a possible diagnosis.    Today, she presents for f/u. Her diffuse joint pain has generally improved since starting Lyrica, now confined to just her bilateral knees. She developed new onset depression and generalized superficial pain with naltrexone and has stopped taking it. 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 Given the imaging findings and presentation, we will proceed with b/l knee IA injections. Lyrica will be continued and LDN will be stopped due to AE. Discussed in detail multimodal options available to address patient's daily pain burden and impact on functionality/quality of life. Risks, benefits, and alternatives of varied options including medication management/changes, interventional approaches, behavioral modifications, further diagnostic workup, as well as referral options outlined with patient employing shared decision.      Based  on the patient's history, physical examination, and review of the available diagnostic data our multimodal recommendations include:    Medications:      For non-opioid therapy, the following medications were prescribed:  - Continue Lyrica 5037mightly; patient instructed to discontinue if weight gain continues or changes In mood noted.  - Stop LDN due to side effects  - Start Ketamine 10%/Ketoprofen 10%/Gabapentin 10%/Lidocaine 5 % cream TID PRN    Opioid therapy:  - None    All medication profiles were outlined for the patient including potential benefits and expected side effects. Titration schedules were also  outlined.    Interventions:Scheduled for R IA knee injection. Consider L knee injection in the future    Imaging:  X-rays of the hands and knees done, no new imaging at this time    Behavioral Therapies:  Discussed non-opioid modalities including:    Pain Psychology (Biofeedback, CBT, Counseling)  Complementary and Alternative Medicine (Acupuncture)  Rehab Therapies (PT, OT)  DME/Bracing/Orthotics/Off-Loading  Massage Therapy  Chiropractic Care  Aquatherapy  Self-Care/Wellness  Home Exercise Program   Relaxation Therapies (Mindfulness, Meditation)  Yoga  Ergonomic Modifications/Counseling  Weight Loss (Nutrition counseling, Diet Modifications)  Low Impact Aerobic Training   Sleep Hygiene  Smoking Cessation    Referrals:    1. PT  2. Pain psychology  3. Rheumatology    Controlled Substance Management Tools:     Prescription Drug Monitoring:  A CURES report was obtained and was reviewed with resident/fellow and treatment team.      The patient screened negative for alcohol/drug misuse on the SBIRT.     Screening for Clinical Depression: The patient was screened for depression.    _0 @   [facility] 09/17/2020 09/16/2020 09/13/2020 05/21/2020   PHQ2 Total _1 Patient was offered intervention where appropriate.    Multi-modal Pain Therapy: The patient was explicitly considered for multimodal and  interdisciplinary therapy. Non-opioid and non-pharmacologic opportunities to enhance analgesia and quality of life were discussed.      Medications may cause sedation and impairment of judgment. The patient is advised against driving or using heavy machinery. The patient is also advised against using alcohol or other substances that may have a further additive effect. Shared decision making utilized to continue current treatment plan and patient's questions were answered.     Follow-Up Plan:  S/f R Knee IA injections    Netta Cedars, MD  Anesthesiology PGY-2      Anita Turner was seen today for follow up.    Diagnoses and all orders for this visit:    Left knee pain, unspecified chronicity  -     Major Joint Injection (Hip, Knee, Shoulder); Future  -     miscellaneous compounded medication - non-controlled; Compounded Neuropathic Ointment: Ketamine 10%/Ketoprofen 10%/Gabapentin 10%/Lidocaine 5 %   Apply thin layer (1 gram) TID to affected area, avoid open wounds  Quantity suffice x 1 month (50 grams)  -     Consult/Referral to Rheumatology    Bilateral primary osteoarthritis of knee  -     Major Joint Injection (Hip, Knee, Shoulder); Future  -     miscellaneous compounded medication - non-controlled; Compounded Neuropathic Ointment: Ketamine 10%/Ketoprofen 10%/Gabapentin 10%/Lidocaine 5 %   Apply thin layer (1 gram) TID to affected area, avoid open wounds  Quantity suffice x 1 month (50 grams)  -     Consult/Referral to Rheumatology    Joint pain in both hands  -     Major Joint Injection (Hip, Knee, Shoulder); Future  -     miscellaneous compounded medication - non-controlled; Compounded Neuropathic Ointment: Ketamine 10%/Ketoprofen 10%/Gabapentin 10%/Lidocaine 5 %   Apply thin layer (1 gram) TID to affected area, avoid open wounds  Quantity suffice x 1 month (50 grams)  -     Consult/Referral to Rheumatology    Chronic pain of both knees  -     Major Joint Injection (Hip, Knee, Shoulder); Future  -     miscellaneous  compounded medication - non-controlled; Compounded Neuropathic Ointment: Ketamine 10%/Ketoprofen 10%/Gabapentin 10%/Lidocaine 5 %  Apply thin layer (1 gram) TID to affected area, avoid open wounds  Quantity suffice x 1 month (50 grams)  -     Consult/Referral to Rheumatology    Pain in joint involving multiple sites  -     Consult/Referral to Rheumatology    Myofascial pain  -     Consult/Referral to Rheumatology            ATTENDING SUPERVISION NOTE:  I have reviewed the patient's medical history, interviewed, and examined Anita Turner. I have discussed my findings and recommendations with the patient as well as the Resident or Fellow or Nurse Practitioner or Physician's Assistant. I have reviewed and edited the note including the history, medications, and physical examination. I personally formulated the assessment and plan in conjunction with the resident or fellow or Nurse Practitioner or Physician's Assistant. Please see the note for further details. Two identifiers were used to verify the identity of the patient.     COUNSELING & COORDINATION OF CARE:  Physician time spent doing the following on the day of the visit:  . Preparing for the visit (ex reviewing test results)   . Obtain or review the patient history   . Performing a medically necessary exam   . Education for the patient, a family member, or caregiver   . Writing orders for tests, medicine, and/or additional services   . Refer or communicate with other health care professionals (when not separately reportable)   . Entering clinical information into the patient's record   . Interpreting and sharing test results with the patient   . Coordinating patient care    Doree Fudge, MD

## 2020-12-21 NOTE — Patient Instructions (Signed)
Pre-Procedure Instructions:       Please be advised that you will need a responsible person to drive you home after the procedure such as a family member or friend; a taxi or Uber will not be acceptable.      You do not need to be fasting for this procedure, unless being scheduled for sedation   If you are a diabetic, fasting glucose will be obtained on the day of the procedure. Significantly elevated levels may result in cancellation of the procedure.      If you are pregnant, or think you may be pregnant, please inform the office and nurse as soon as possible     If you are taking any blood thinning medications, you may need to hold it in preparation for the procedure. Please notify the staff member scheduling this procedure and they will contact the nurse at the [place] office. The Pain Management Nurse will contact your physician managing your anticoagulation therapy to verify if it is safe for you to hold this medication. Please continue taking this medication as prescribed until you get specific instructions from the nurse. If you have any questions, please call the [place] office at [phone].     Please call the [place] office to reschedule your procedure if you have an active infection. If you present to the [place] office while taking antibiotics for an infection or show signs of active infection, the Physician may postpone your procedure.

## 2020-12-25 ENCOUNTER — Telehealth: Payer: Self-pay | Admitting: Rheumatology

## 2020-12-25 NOTE — Telephone Encounter (Signed)
Patient is scheduled on 03/27/21 and would like to know if she can be seen before 01/11/21. If patient cant be seen before this date she will keep 03/27/21. Please contact patient for further assistance, thank you.

## 2020-12-26 NOTE — Telephone Encounter (Signed)
12/26/2020 at 0906.  Called and spoke to patient.  No appt available before 01/11/2021.  Offered sooner appt with Alanson Puls, NP and Dr. Yvone Neu but patient decline.  Patient will keep her current appt.  Alean Rinne, RN

## 2020-12-27 ENCOUNTER — Encounter: Payer: Self-pay | Admitting: Anesthesiology

## 2020-12-28 ENCOUNTER — Encounter: Payer: Self-pay | Admitting: Anesthesiology

## 2020-12-28 ENCOUNTER — Ambulatory Visit: Payer: No Typology Code available for payment source | Admitting: Anesthesiology

## 2020-12-28 MED ORDER — PREGABALIN 50 MG OR CAPS
50.0000 mg | ORAL_CAPSULE | Freq: Every evening | ORAL | 0 refills | Status: DC
Start: 2020-12-28 — End: 2021-02-14

## 2020-12-30 ENCOUNTER — Encounter: Payer: Self-pay | Admitting: Internal Medicine

## 2021-01-03 ENCOUNTER — Ambulatory Visit: Payer: Self-pay | Admitting: Dermatology

## 2021-01-03 NOTE — Telephone Encounter (Signed)
New vaccine card left at front office for patient to pickup.  Patient was called and advised.

## 2021-01-04 ENCOUNTER — Telehealth: Payer: Self-pay | Admitting: Retina Specialist

## 2021-01-04 NOTE — Telephone Encounter (Signed)
The pt is asking for a follow up app to address her L eye redness, eye pain, discomfort. I was able to find appt for 07/13 but shes reporting eye pain so she needs to be triaged. I called [facility] and was assisted with getting the pt connected to a tech to assist. Thank you.

## 2021-01-08 ENCOUNTER — Telehealth: Payer: No Typology Code available for payment source | Admitting: Family Medicine

## 2021-01-08 DIAGNOSIS — R059 Cough, unspecified: Secondary | ICD-10-CM

## 2021-01-08 NOTE — Progress Notes (Signed)
[facility]  [address] [place]    TELEMEDICINE VISIT     SUBJECTIVE:     Anita Turner is a 50 year old female called for the following:    Dry cough at night, started 10 days ago.  Tried dextromethorphan, benzocaine/menthol with little effect.  Now more noticing during the day as well.  No sore throat, no congestion, no runny nose, no myalgia, no nausea, no chills.   Mild itchy ears, no ear pain.  No hx of allergies, no hx of asthma.  Hx of reflux.    OBJECTIVE:     PHYSICAL EXAMINATION:  No vitals signs were done as visit was not face-to-face.    Limited physical exam includes:  General: Alert, no distress non-toxic appearing.    LABS AND IMAGING:  No recent contributory.    ASSESSMENT/PLAN:     1. Cough, subacute.   Suspecting secondary to known recent reflux sx. No hx of asthma. Differential includes allergic rhinitis, though pt denies hx. No other infectious sx at this time.   Trial OTC famotidine/Pepcid twice a day for the next two weeks. If still ineffective, okay to trial allergy medications such as fluticasone/Flonase spray with loratadine/Claritin or cetirizine/Zyrtec.   Pt aware if sx persist to follow up with PCP.    RTC: Return in about 1 month (around 02/07/2021) for cough w PCP if persists.    All the above issues were fully discussed with patient during our telephone conversation and patient is aware of all of these issues as outlined above.     Patient was given strict return precautions should symptoms worsen or fail to improve for any concerns.    Donia Guiles, MD  [place]

## 2021-01-09 ENCOUNTER — Ambulatory Visit: Payer: No Typology Code available for payment source | Admitting: Anesthesiology

## 2021-01-10 NOTE — Telephone Encounter (Signed)
Called the patient to check up her.    Mailbox was full unable to leave message.    If patient calls back please schedule with Dr Gregary Cromer.

## 2021-01-15 ENCOUNTER — Ambulatory Visit: Payer: Self-pay | Admitting: Internal Medicine

## 2021-02-13 ENCOUNTER — Telehealth: Payer: Self-pay | Admitting: Internal Medicine

## 2021-02-13 ENCOUNTER — Ambulatory Visit: Payer: No Typology Code available for payment source

## 2021-02-13 NOTE — Telephone Encounter (Signed)
appt rescheduled to 02/14/21 at 3:00 pm with Dr Marion Downer called spoke with patient informed appt would be a zoom visit patient voiced understanding

## 2021-02-13 NOTE — Interdisciplinary (Deleted)
Pt did not show for her 2pm physical therapy evaluation.

## 2021-02-13 NOTE — Telephone Encounter (Signed)
Pt currently booked on 8/3 and requesting  for sooner appt. States been having persistent cough for a month now, Pls assist and call her back, Thank you

## 2021-02-14 ENCOUNTER — Encounter: Payer: Self-pay | Admitting: Family Medicine

## 2021-02-14 ENCOUNTER — Other Ambulatory Visit: Payer: No Typology Code available for payment source | Admitting: Family Medicine

## 2021-02-14 DIAGNOSIS — R059 Cough, unspecified: Secondary | ICD-10-CM

## 2021-02-14 DIAGNOSIS — Z789 Other specified health status: Secondary | ICD-10-CM

## 2021-02-14 MED ORDER — AMOXICILLIN 500 MG OR CAPS
500.00 mg | ORAL_CAPSULE | Freq: Three times a day (TID) | ORAL | Status: DC
Start: 2021-01-08 — End: 2021-02-14

## 2021-02-14 NOTE — Interdisciplinary (Signed)
Patient consented to proceed with the video visit and confirmed she is in the state of [place].   Patient would like to discuss dry cough she's had for 1 month already.

## 2021-02-14 NOTE — Progress Notes (Signed)
Due to COVID-19 pandemic and a federally declared state of public health emergency, this telemedicine visit was conducted Audio+Video. The patient was in [place] for the duration of the visit.  The patient consented to a virtual visit, understanding the risks, such as a limited examination, and was aware that an in-person visit was available to them. Total time spent was 11-20 minutes.         Subjective:   Anita Turner is a 50 year old female who is here for Cough (Dry cough x1 month)      HPI    Anita Turner is a 50 year old female on     Dry cough for about 6 weeks  Initially cough only at night.  Had telemed visit 01-08-21 and rxed pepcid.  Tried a few days- no help and then went to [place] and forgot her medication.  Cough decreased.  July 10th came back from [place] and cough has worsened.  Has tried Delsym but not helping.  Denies fever, sob, vomiting, wheezing    No hx asthma, allergies    Covid disease history- No  Known exposure to person with active Covid disease-No  Covid vaccine- had 3 Pfizer covid vaccines,  Most recent 12-05-20    Covid test - has not tested for Covid.  Does not have a home antigen test        Patient Active Problem List    Diagnosis Date Noted   . Age-related nuclear cataract of both eyes 10/12/2020   . 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 Nephrolithiasis 05/05/2019   . Need for vaccination 05/05/2019   . Pemphigus vulgaris 03/18/2019   . 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 Ingrowing toenail 06/23/2018   . Foot pain 06/23/2018   . Disorder of vitamin B12 06/14/2018   . Prediabetes 06/14/2018   . 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 Class 2 obesity due to excess calories without serious comorbidity with body mass index (BMI) of 38.0 to 38.9 in adult 04/30/2017   . Abnormal EKG 03/18/2017   . Shortness of breath 03/18/2017   . Strain of muscle of right hip 09/05/2016   . Obesity with body mass index 30 or greater 09/05/2016   . Osteoarthritis of knee 09/05/2016    . Patellofemoral stress syndrome 08/22/2016   . Pain of right thumb 02/06/2016   . Synovitis 02/06/2016       Past Medical History:   Diagnosis Date   . Abnormal EKG 03/18/2017   . 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 Class 2 obesity due to excess calories without serious comorbidity with body mass index (BMI) of 38.0 to 38.9 in adult 04/30/2017   . Foot pain 06/23/2018   . Ingrowing toenail 06/23/2018   . Nephrolithiasis    . Obesity with body mass index 30 or greater 09/05/2016   . Osteoarthritis of knee 09/05/2016   . Pain of right thumb 02/06/2016   . Patellofemoral stress syndrome 08/22/2016   . 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 Pemphigus vulgaris 03/18/2019   . Prediabetes 06/14/2018   . Shortness of breath 03/18/2017   . Strain of muscle of right hip 09/05/2016   . Synovitis 02/06/2016       Past Surgical History:   Procedure Laterality Date   . CHOLECYSTECTOMY  2017   . APPENDECTOMY  2002       Family History   Problem Relation Name Age of Onset   . Diabetes Mother  HTN, DJD   . Neurological Disease (Parkinsons/Huntingtons/Huntingtons Chorea) Father          DM, alcoholism   . Diabetes Father     . No Known Problems Sister     . Thyroid Brother     . Cataract Maternal Grandfather     . Cataract Paternal Grandfather         Social History     Social History Narrative    Married, one teenager daughter, no pets, homemaker, yoga, walks for exercise        As of 02-14-21     Social History     Socioeconomic History   . Marital status: Married   Tobacco Use   . Smoking status: Never Smoker   . Smokeless tobacco: Never Used   Substance and Sexual Activity   . Alcohol use: Not Currently   . Drug use: Never   . Sexual activity: Yes     Partners: Male   Social History Narrative    Married, one teenager daughter, no pets, homemaker, yoga, walks for exercise        As of 02-14-21       Immunization History   Administered Date(s) Administered   . COVID-19 Therapist, music) [employer] >= 12 Years 12/05/2020   . COVID-19 [employer])  Purple Cap >= 12 Years 10/31/2019, 11/23/2019   . Influenza Vaccine (Unspecified) 06/13/2017, 05/20/2019   . Influenza Vaccine >=6 Months 04/30/2017, 06/18/2018, 05/05/2019, 05/21/2020       Current Outpatient Medications   Medication Sig Dispense Refill   . buPROPion (WELLBUTRIN XL) 150 MG XL tablet Take 1 tablet (150 mg) by mouth every morning. 90 tablet 3   . clonazePAM (KLONOPIN) 0.5 MG tablet Take 1 tablet (0.5 mg) by mouth 2 times daily as needed for Anxiety. 10 tablet 0   . diphenhydrAMINE (BENADRYL) 50 MG/ML injection Please give 50 mg IV push prior to Rituxan infusions.  If needed may give a repeat 25 mg IV push dose. 1 vial 8   . miscellaneous compounded medication - non-controlled Compounded Neuropathic Ointment: Ketamine 10%/Ketoprofen 10%/Gabapentin 10%/Lidocaine 5 %   Apply thin layer (1 gram) TID to affected area, avoid open wounds  Quantity suffice x 1 month (50 grams) 1 g 1   . Multiple Vitamin (MULTIVITAMIN) capsule Take 1 capsule by mouth daily. 30 tablet 0   . valACYclovir (VALTREX) 500 MG tablet Take 1 tablet (500 mg) by mouth daily. 90 tablet 3     No current facility-administered medications for this visit.       No Known Allergies      Review of Systems     Fever no  Chills  no  Cough  YES  Sob/difficulty breathing  no  Fatigue no  Muscle or body aches  no  New loss of taste or smell  no  Sore throat  Only when throat tickles and she coughs  Congestion or runny nose  no  Nausea  no  Vomiting  no   Diarrhea  no  Headache no      Objective:  Vital signs: There were no vitals taken for this visit.    Physical Exam  Exam limited due to video visit  Gen: Alert, NAD  Head: NC/AT  Pulm: No respiratory distress,  speaking in full sentences without difficulty, occ cough  Ext: no visible upper extremity deformity  Skin: no visible rash  Neuro: Awake and alert  Psych: conversing appropriately, normal affect  Assessment/Plan:    .  1. Cough  2. History of international travel (recently came back from  [place])    -cough x approx 6 weeks, dry cough, no known covid exposure but just came back for a trip to [place]    Pepcid and otc meds tried, not on ACE  - covid PCR test ordered,   - COVID-19 Isolation Precautions  - Coronavirus Disease 2019 (COVID-19) SCOV Nasal-Pharyngeal; Future    Discussed Covid-19 management/ER [email or web address] Covid website     pt to come to walk-in clinic this evening to have the covid PCR test

## 2021-02-15 LAB — CORONAVIRUS DISEASE 2019 (COVID-19) SCOV
COVID-19 Comment: NOT DETECTED
COVID-19 Result: DETECTED — AB

## 2021-02-16 ENCOUNTER — Telehealth: Payer: No Typology Code available for payment source | Admitting: Nurse Practitioner

## 2021-02-16 DIAGNOSIS — U071 COVID-19: Secondary | ICD-10-CM

## 2021-02-16 DIAGNOSIS — R059 Cough, unspecified: Secondary | ICD-10-CM

## 2021-02-16 MED ORDER — PREDNISONE 20 MG OR TABS
20.0000 mg | ORAL_TABLET | Freq: Every day | ORAL | 0 refills | Status: AC
Start: 2021-02-16 — End: 2021-02-21

## 2021-02-16 MED ORDER — BENZONATATE 200 MG OR CAPS
200.0000 mg | ORAL_CAPSULE | Freq: Three times a day (TID) | ORAL | 0 refills | Status: DC | PRN
Start: 2021-02-16 — End: 2021-04-25

## 2021-02-16 MED ORDER — ALBUTEROL SULFATE 108 (90 BASE) MCG/ACT IN AERS
2.0000 | INHALATION_SPRAY | Freq: Four times a day (QID) | RESPIRATORY_TRACT | 0 refills | Status: DC | PRN
Start: 2021-02-16 — End: 2021-04-25

## 2021-02-16 NOTE — Patient Instructions (Signed)
Follow Up:  Please follow up with your primary medical doctor within 1 week on zoom or sooner if needed.   Please message your primary medical doctor with any follow up concerns.  Please go to the closed emergency department if symptoms are worsening or for any shortness of breath, chest pain, difficulty breathing, loss of consciousness, or new onset confusion.      Labs:  None     Medications:  -Please use Albuterol inhaler 1-2 puffs every 4-6 hours as needed for cough, chest congestion, wheezing or shortness off breath.   -May use Tessalon 1 pill by mouth every 8 hours as needed for cough.   -May use Prednisone 1 pill by mouth once a day x 5 days. Please take this medication with food and water.      Recommendations:  Increase fluids  Maintain hand hygiene  Please take a good multivitamin and probitic.   Increase rest.   Over the counter cold and flu medications  Vitamin C, Vitamin D, Zinc    Follow CDC guidelines for COVID-19.   -Isolate from other members of family/household members; it's best if you can sleep in a separate room and use a separate bathroom   -Isolate at home until 5 days have passed from the first day you became ill AND you have had no fevers for 24 hours without taking any Tylenol (acetaminophen) or ibuprofen AND you are generally improved. Then please follow STRICT mask wearing for the following 5 days after you are done with isolation.  -When out of your room, wear mask and try to maintain 6 feet of space from household members; family/household members must wear mask when coming into your room to deliver food or provide care   -No visitors and do not go out; have someone bring groceries and leave outside door.   -Strict handwashing is important; avoid touching face/eyes/nose/mouth   -Disinfect all high-touch surfaces in your home, car and environment with a disinfectant or soap and water   -Rest as needed and stay well hydrated; good nutrition is important for recovery   -Monitor your  temperature twice daily and notify your healthcare provider if over 100.4 and record your temperatures.   -Monitor your pulse oximetry and your heart rate twice a day and record  -Call 911 if you feel short of breath at rest and unable to complete sentences without taking breath   -Let you employer know about this infection so you can learn their return to work policy.     -Good web sites for information:   [email or web address]   [email or web address]     Thank you for allowing me to be part of your care today!

## 2021-02-16 NOTE — H&P (Cosign Needed)
Anita Turner is a 50 year old year old female is here for   No chief complaint on file.    Due to COVID-19 pandemic and a federally declared state of public health emergency, this telemedicine visit was conducted Audio+Video. Total time spent was 11-20 minutes.     Pt states they are in the states of [place] for this Telemed visit and consents to proceeding with the visit.     Encounter Type: Brief Visit Zoom video visit    Chief Complaint:  Patient presents to Zoom Video visit c/o persistent dry cough.    6 weeks ago pt was having flu like symptoms consist of body aches, fatigue and headaches. She thought this may be d/t taking Lyrica so her and her physician discontinued the lyrica. She then had an appointment and was told the cough could potentially be d/t GERD, she started Pepcid for 4-5 days but found no relief and stopped taking the Pepcid. Pt then travelled to [place]. Pt returned from [place] on 02/10/21 and the cough had worsened. 02/14/21 pt had a zoom video visit and received Covid testing which was positive.   Pt reports she now has a coughing attack about once an hour.     At time of visit pt reports no o fevers, chills, body aches, headaches, dizziness, fatigue, sore throat, ear ache, shortness of breath, chest pain, heart palpitations, loss of sense of taste or smell, chest congestion, nausea, vomiting, diarrhea, constipation, or rash.     Pt has tried delsym, vicks, halls, pepcid  Pt has received influenza vaccine this year.   Pt has received Covid vaccine x 3. (most recent booster 12/05/20).  Pt has no known recent exposure to persons positive for Covid 19.     Significant Medical Hx: depression, BMI > 30, prediabetes, pemphigus  Medications: please see medication list  Supplements: multivitamins    Review of Systems:  All systems were reviewed and were negative or stable except for persistent dry cough.     Allergies  No Known Allergies    OBJECTIVE:    No VS were obtained for this visit as it is a  Telemed visit.      Objective Notes:      Constitutional: Well developed, well nourished, alert and oriented x4, in no acute distress.  HEENT: Normal cephalic and atraumatic.  Respirations: Pt is speaking in full sentences. No audible wheezes. Respirations are regular, even, quiet and non-labored.   Skin: Skin appears smooth, dry and appropriate color for ethnicity.   Psychiatric: Mood is congruent to affect.     Assessment:    ICD-10-CM ICD-9-CM    1. COVID-19 virus infection  U07.1 079.89    2. Cough  R05.9 786.2 albuterol 108 (90 Base) MCG/ACT inhaler      predniSONE (DELTASONE) 20 MG tablet      benzonatate (TESSALON) 200 MG capsule     Answered all pt's questions to pt's satisfaction. Pt verbalizes understanding and agrees to plan of care.     It is possible that pt's initial covid infection occurred in June and that this is post viral cough and d/t high sensitivity of PCR testing she is continuing to test postiive on PCR. However, d/t unclear beginning of Covid symptoms vs cough will recommend pt maintains current CDC quarantine guidelines. Discussed with pt iporantance of having her family tested as well. It is unclear when exactly covid symptoms began but as pt's cough worsened around the time she returned from [place] on  02/10/21 this is out of the 5 day window for initiatino of tx with paxlovid. Will no begin paxlvoid today, will provide supporive treatment instead.     Diff dx: post viral cough syndrome    Follow Up:  Please follow up with your primary medical doctor within 1 week on zoom or sooner if needed.   Please message your primary medical doctor with any follow up concerns.  Please go to the closed emergency department if symptoms are worsening or for any shortness of breath, chest pain, difficulty breathing, loss of consciousness, or new onset confusion.      Labs:  None.     Medications:  -Please use Albuterol inhaler 1-2 puffs every 4-6 hours as needed for cough, chest congestion, wheezing or  shortness off breath.   -May use Tessalon 1 pill by mouth every 8 hours as needed for cough.   -May use Prednisone 1 pill by mouth once a day x 5 days. Please take this medication with food and water.     Recommendations:  Increase fluids  Maintain hand hygiene  Please take a good multivitamin and probitic.   Increase rest.   Over the counter cold and flu medications  Vitamin C, Vitamin D, Zinc    Follow CDC guidelines for COVID-19.   -Isolate from other members of family/household members; it's best if you can sleep in a separate room and use a separate bathroom   -Isolate at home until 5 days have passed from the first day you became ill AND you have had no fevers for 24 hours without taking any Tylenol (acetaminophen) or ibuprofen AND you are generally improved. Then please follow STRICT mask wearing for the following 5 days after you are done with isolation.  -When out of your room, wear mask and try to maintain 6 feet of space from household members; family/household members must wear mask when coming into your room to deliver food or provide care   -No visitors and do not go out; have someone bring groceries and leave outside door.   -Strict handwashing is important; avoid touching face/eyes/nose/mouth   -Disinfect all high-touch surfaces in your home, car and environment with a disinfectant or soap and water   -Rest as needed and stay well hydrated; good nutrition is important for recovery   -Monitor your temperature twice daily and notify your healthcare provider if over 100.4 and record your temperatures.   -Monitor your pulse oximetry and your heart rate twice a day and record  -Call 911 if you feel short of breath at rest and unable to complete sentences without taking breath   -Let you employer know about this infection so you can learn their return to work policy.     -Good web sites for information:   [email or web address]   [email or web address]      Thank  you for allowing me to be part of your care today!

## 2021-02-17 ENCOUNTER — Encounter: Payer: Self-pay | Admitting: Internal Medicine

## 2021-02-20 ENCOUNTER — Ambulatory Visit: Payer: No Typology Code available for payment source

## 2021-02-27 ENCOUNTER — Ambulatory Visit: Payer: No Typology Code available for payment source

## 2021-03-06 ENCOUNTER — Encounter: Payer: No Typology Code available for payment source | Admitting: Internal Medicine

## 2021-03-12 ENCOUNTER — Inpatient Hospital Stay (INDEPENDENT_AMBULATORY_CARE_PROVIDER_SITE_OTHER)
Admit: 2021-03-12 | Discharge: 2021-03-12 | Disposition: A | Discharge status: Home Health | Payer: No Typology Code available for payment source

## 2021-03-12 ENCOUNTER — Encounter: Payer: Self-pay | Admitting: Internal Medicine

## 2021-03-12 DIAGNOSIS — R059 Cough, unspecified: Secondary | ICD-10-CM

## 2021-03-13 ENCOUNTER — Encounter: Payer: Self-pay | Admitting: Internal Medicine

## 2021-03-13 ENCOUNTER — Ambulatory Visit: Payer: No Typology Code available for payment source | Admitting: Internal Medicine

## 2021-03-13 VITALS — BP 136/72 | HR 68 | Temp 97.1°F | Ht 60.0 in | Wt 205.0 lb

## 2021-03-13 DIAGNOSIS — R059 Cough, unspecified: Secondary | ICD-10-CM

## 2021-03-13 MED ORDER — OMEPRAZOLE 20 MG OR CPDR
20.0000 mg | DELAYED_RELEASE_CAPSULE | Freq: Every day | ORAL | 3 refills | Status: DC
Start: 2021-03-13 — End: 2021-04-25

## 2021-03-13 NOTE — Patient Instructions (Signed)
1)please start omeprazole daily for 8 weeks. You can increase I is 2 tablets per day if needed.  2) Please use albuterol inhaler as needed for wheezing/cough  3) Follow-up if symptoms persists.

## 2021-03-13 NOTE — Interdisciplinary (Signed)
Pt is here to discuss cough x 1 month

## 2021-03-13 NOTE — Progress Notes (Signed)
 [facility] Primary [facility] History and Physical    PATIENT:  Anita Turner  MRN:  [id number]  DOB:  1970/12/03  DATE OF SERVICE:  04/28/2020    PRIMARY CARE PROVIDER: Hanley Hays    Chief Complaint   Patient presents with   . Cough     Cough x 1 month

## 2021-03-14 ENCOUNTER — Ambulatory Visit: Payer: No Typology Code available for payment source | Admitting: Dermatology

## 2021-03-14 ENCOUNTER — Other Ambulatory Visit
Admission: RE | Admit: 2021-03-14 | Discharge: 2021-03-14 | Disposition: A | Discharge status: Home / Self Care | Payer: No Typology Code available for payment source | Attending: Dermatology | Admitting: Dermatology

## 2021-03-14 VITALS — Temp 97.7°F

## 2021-03-14 DIAGNOSIS — Z79899 Other long term (current) drug therapy: Secondary | ICD-10-CM

## 2021-03-14 DIAGNOSIS — L918 Other hypertrophic disorders of the skin: Secondary | ICD-10-CM

## 2021-03-14 DIAGNOSIS — L100 Pemphigus vulgaris: Secondary | ICD-10-CM

## 2021-03-14 DIAGNOSIS — B009 Herpesviral infection, unspecified: Secondary | ICD-10-CM

## 2021-03-15 LAB — GLYCOSYLATED HGB(A1C), BLOOD: Glycated Hgb, A1C: 5.5 % (ref 4.6–5.6)

## 2021-03-15 NOTE — Progress Notes (Signed)
 Department of Dermatology  [facility]  PATIENT: Anita Turner  MRN: [id number]  DOB: 03-24-1971  DATE OF SERVICE: 03/14/2021    DERMATOLOGY FOLLOW-UP PATIENT VISIT     REFERRING PRACTITIONER: Hanley Hays  PRIMARY CARE PROVIDER: Hanley Hays  CHIEF COMPLAINT:   Chief Complaint   Patient presents with   . Follow Up     Subjective:     Anita Turner is a 50 year old very pleasant female who was seen in clinic for f/u pemphigus vulgaris and pemphigus foliaceous.     Recently returned from trip to [place] and has been under increasing stress. Had some sun but is now taking precautions to prevent sun exposure.   Since last visit in April has been off cellcept but feels she is getting some irritation/dryness in mouth (no active lesions) and worried it may be coming back. Wondering if she can have repeat DG levels today.    No recurrent HSV (on ppx).    Would like some skin tags treated on neck as did not resolve with LN2.     No vulvovaginal discharge, pain, burning, itching, or other symptoms.    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  Social History: Pt reports that she has never smoked. She has never used smokeless tobacco. She reports previous alcohol use. She reports that she does not use drugs.  Family History:Pt family history includes Cataract in  her maternal grandfather and paternal grandfather; Diabetes in her father and mother; Neurological Disease (Parkinsons/Huntingtons/Huntingtons Chorea) in her father; No Known Problems in her sister; Thyroid in her brother.  Home Medications: Pt has a current medication list which includes the following prescription(s): albuterol, benzonatate, bupropion, clonazepam, diphenhydramine, miscellaneous compounded medication - non-controlled, multivitamin, omeprazole, and valacyclovir.  Allergies: Patient has no known allergies.    Objective:   General: Pleasant, in no acute distress, appears stated age  Psychiatric: Mood and affect are within normal limits  Respiratory: No increased work of breathing     03/14/21  1054   Temp: 97.7 F (36.5 C)       Skin Exam:  Skin Type: IV  Patient declines a chaperone today  Pertinent skin findings below:  Skin exam performed including scalp, face, conjunctiva, lips, oral mucosa, hands, and the following was noted:    Patient denies vulvovaginal concerns or symptoms  No oral lesions or gingival erythema today  Faint erythema of b/l cheeks  Scattered skin colored to hyperpigmented pedunculated papules on neck and axillae b/l     02/07/2019   Basement Membrane Zone (BMZ) IgG, IgG4, and IgA  IgG: Negative, monkey esophagus substrate     Negative, human split skin substrate      IgG4: Negative, monkey esophagus substrate        Negative, human split skin substrate   IgA: Negative, monkey esophagus substrate     Negative, human split skin substrate   Positive IgG, including IgG4, cell surface antibodies,  monkey esophagus substrate     Enzyme Linked Immunosorbent Assay (ELISA)   --------------------------------------------------   Bullous Pemphigoid (BP) 180 and 230 IgG Antibodies   IgG BP 180 antibodies: 1 unit  NEGATIVE  IgG BP 230 antibodies: 1 unit  NEGATIVE    02/07/2019  Cell Surface IgG Antibodies   IgG: Positive, titer 1:2560 (H), monkey esophagus  substrate     Positive, titer 1:640 (H), intact human skin substrate     Enzyme-Linked Immunosorbent Assay (ELISA)  -----------------------------------------  Desmoglein (DSG) 1 and 3 IgG Antibodies   IgG desmoglein 1 antibodies:  7 units  NEGATIVE    Reference Range:      Positive (H) = Greater than 20 units      Borderline/Indeterminate = 14-20 units      Negative = Less than 14 units   IgG desmoglein 3 antibodies: 320 units (H)  POSITIVE     (Initial level, 139 units, greater than high         calibrator; diluted to achieve 64 units, within      assay calibrators, and multiplied by the dilution      factor of 5)   COMMENTS   Specific   -------------------------------------------------   These indirect immunofluorescence results, demonstrating   positive IgG cell surface antibody reactivity on both   monkey esophagus and intact human skin substrates, support   the diagnosis of pemphigus.    Assessment/Plan     # Pemphigus vulgaris   # High risk medication monitoring  Patient previously followed by Dr. Roetta Sessions and prior [facility] including serologies for pemphigus/pemphigoid, CBC, CMP. She was previously on rituxan. Was also on IVIg monthly but stopped in November 2021 due to difficulty tolerating infusions.   Has no active clinical findings today and is asymptomatic. Denies vulvovaginal involvement. Plan was for her to be on cellcept 1000 mg bid but since last visit as well-controlled and her concern for side effects, she discontinued (not on doxy and NCN).    - Discussed adding systemic steroids but patient prefers to hold off for now. Discussed if flaring to notify me, to start systemic steroids.  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  Improvement with suppressive therapy, which is particularly important in the setting of her pemphigus as may be difficult to identify whether new symptomatology is related to HSV or pemphigus  - Continue valtrex 500 mg daily as suppression     # Inflamed acrochordons, neck and axillae  - Clinically without concerning features on exam today but the patient is to return to clinic for any changing skin lesions or skin concerns  - No improvement with prior LN2, so treated with snipping therapy today, patient tolerated well    The above plan of care, diagnosis, orders, and follow-up were discussed with the patient. Risks, benefits, alternatives to therapy were discussed. Questions related to this recommended plan of care were answered.    RTC in 2 months    Mee Hives, MD

## 2021-03-16 ENCOUNTER — Other Ambulatory Visit: Payer: Self-pay | Admitting: Internal Medicine

## 2021-03-18 MED ORDER — CLONAZEPAM 0.5 MG OR TABS
ORAL_TABLET | ORAL | 0 refills | Status: DC
Start: 2021-03-18 — End: 2021-11-21

## 2021-03-20 LAB — PEMPHIGUS ANTIBODY PANEL

## 2021-03-21 ENCOUNTER — Encounter: Payer: Self-pay | Admitting: Dermatology

## 2021-03-24 ENCOUNTER — Encounter: Payer: Self-pay | Admitting: Internal Medicine

## 2021-03-27 ENCOUNTER — Other Ambulatory Visit: Payer: Self-pay | Admitting: Rheumatology

## 2021-03-27 ENCOUNTER — Encounter: Payer: Self-pay | Admitting: Rheumatology

## 2021-03-27 ENCOUNTER — Ambulatory Visit: Payer: No Typology Code available for payment source | Attending: Rheumatology | Admitting: Rheumatology

## 2021-03-27 VITALS — BP 132/69 | HR 81 | Temp 97.8°F | Resp 14 | Wt 202.0 lb

## 2021-03-27 DIAGNOSIS — M2550 Pain in unspecified joint: Secondary | ICD-10-CM | POA: Insufficient documentation

## 2021-03-27 DIAGNOSIS — Z6839 Body mass index (BMI) 39.0-39.9, adult: Secondary | ICD-10-CM | POA: Insufficient documentation

## 2021-03-27 DIAGNOSIS — L100 Pemphigus vulgaris: Secondary | ICD-10-CM | POA: Insufficient documentation

## 2021-03-27 DIAGNOSIS — R059 Cough, unspecified: Secondary | ICD-10-CM | POA: Insufficient documentation

## 2021-03-27 DIAGNOSIS — E669 Obesity, unspecified: Secondary | ICD-10-CM | POA: Insufficient documentation

## 2021-03-27 DIAGNOSIS — M1990 Unspecified osteoarthritis, unspecified site: Secondary | ICD-10-CM | POA: Insufficient documentation

## 2021-03-27 MED ORDER — MYCOPHENOLATE MOFETIL 500 MG OR TABS
500.0000 mg | ORAL_TABLET | Freq: Two times a day (BID) | ORAL | Status: DC
Start: ? — End: 2021-04-10

## 2021-03-27 MED ORDER — DICLOFENAC SODIUM 1 % EX GEL
CUTANEOUS | 0 refills | Status: DC
Start: 2021-03-27 — End: 2021-11-21

## 2021-03-27 MED ORDER — DICLOFENAC SODIUM 1 % EX GEL
4.0000 g | Freq: Four times a day (QID) | CUTANEOUS | 0 refills | Status: DC
Start: 2021-03-27 — End: 2021-03-27

## 2021-03-27 NOTE — Patient Instructions (Addendum)
 Here is some information on wellness and immune health particularly     From the [facility] clinic    [email or web address]       I suggest a paraffin bath, physical therapy  I also suggest

## 2021-03-27 NOTE — Progress Notes (Signed)
 Rheumatology Clinic   New patient Consult note    I was kindly requested by Spraker, Cortney L to evaluate the patient for complaints of joint pain.     Encounter Date: 03/27/2021    History of Present Illness:  Anita Turner is a 50 year old female with co

## 2021-03-29 ENCOUNTER — Ambulatory Visit: Payer: No Typology Code available for payment source | Admitting: Internal Medicine

## 2021-03-29 ENCOUNTER — Other Ambulatory Visit
Admission: RE | Admit: 2021-03-29 | Discharge: 2021-03-29 | Disposition: A | Discharge status: Home / Self Care | Payer: No Typology Code available for payment source | Source: Ambulatory Visit | Attending: Internal Medicine | Admitting: Internal Medicine

## 2021-03-29 ENCOUNTER — Encounter: Payer: Self-pay | Admitting: Internal Medicine

## 2021-03-29 VITALS — BP 134/71 | HR 76 | Temp 97.2°F | Ht 60.0 in | Wt 206.4 lb

## 2021-03-29 DIAGNOSIS — Z23 Encounter for immunization: Secondary | ICD-10-CM

## 2021-03-29 DIAGNOSIS — R058 Other specified cough: Secondary | ICD-10-CM

## 2021-03-29 DIAGNOSIS — N393 Stress incontinence (female) (male): Secondary | ICD-10-CM

## 2021-03-29 DIAGNOSIS — R300 Dysuria: Secondary | ICD-10-CM | POA: Insufficient documentation

## 2021-03-29 LAB — URINALYSIS 8, POINT OF CARE TESTING
Glucose, UA POCT: NEGATIVE mg/dL
Ketone, UA POCT: NEGATIVE mg/dL
Leukocytes, UA POCT: NEGATIVE
Nitrite, UA POCT: NEGATIVE
Protein, UA POCT: NEGATIVE mg/dL
Specific Gravity, UA (POCT): <1.005 (ref 1.003–1.030)
pH, UA POCT: 6 (ref 5.0–8.0)

## 2021-03-29 MED ORDER — PHENAZOPYRIDINE HCL 100 MG OR TABS
100.0000 mg | ORAL_TABLET | Freq: Three times a day (TID) | ORAL | 0 refills | Status: DC
Start: 2021-03-29 — End: 2021-04-25

## 2021-03-29 NOTE — Progress Notes (Signed)
Patient: Anita Turner  DOB: [birth year]  Primary Care Provider: Boyce Medici  BRIEF VISIT/FOLLOW UP-ADULT    Subjective:      Anita Turner is a 50 year old Non-Hispanic female who presents today for Cough (Patient reports urinating when coughing also burning sensation)      Current Concerns/Interval History:     #Cough:  Chronic cough present since having COVID  Has had chest xray which was negative.  Previously evaluated by PCP.  The sensation is like a tickle in the back of the throat.  On PPI for acid reflux, has inhaler but makes it worse, tried the Tessalon pearls but makes her sleepy, uses the lozenges but worried about using too much because of fear of using too much with history of pemphigus and irritation to her mouth.    #Burning with urination:  Present urine leakage when coughing and a burning sensation.  Denies fevers, flank pain, diarrhea, nausea, or blood in her urine.  Has history of kidney stones and this does not feel like this time.      Past Medical History:  Patient Active Problem List   Diagnosis   . Pemphigus vulgaris   . Pemphigus foliaceus   . Strain of muscle of right hip   . Abnormal EKG   . Chondromalacia of right patella   . Obesity with body mass index 30 or greater   . Class 2 obesity due to excess calories without serious comorbidity with body mass index (BMI) of 38.0 to 38.9 in adult   . Disorder of vitamin B12   . Ingrowing toenail   . Osteoarthritis of knee   . Pain of right thumb   . Patellofemoral stress syndrome   . Prediabetes   . Foot pain   . Shortness of breath   . Synovitis   . Nephrolithiasis   . Need for vaccination   . Vitreomacular adhesion of both eyes   . Age-related nuclear cataract of both eyes       Past Surgical History:  Past Surgical History:   Procedure Laterality Date   . CHOLECYSTECTOMY  2017   . APPENDECTOMY  2002       Medications:    Current Outpatient Medications:   .  albuterol 108 (90 Base) MCG/ACT inhaler, Inhale 2 puffs by mouth every 6 hours  as needed for Wheezing or Shortness of Breath (cough or chest congestion)., Disp: 1 each, Rfl: 0  .  benzonatate (TESSALON) 200 MG capsule, Take 1 capsule (200 mg) by mouth every 8 hours as needed for Cough., Disp: 30 capsule, Rfl: 0  .  buPROPion (WELLBUTRIN XL) 150 MG XL tablet, Take 1 tablet (150 mg) by mouth every morning., Disp: 90 tablet, Rfl: 3  .  clonazePAM (KLONOPIN) 0.5 MG tablet, TAKE 1 TABLET BY MOUTH 2 TIMES DAILY AS NEEDED FOR ANXIETY., Disp: 10 tablet, Rfl: 0  .  diclofenac (VOLTAREN) 1 % gel, APPLY 4 GRAM TOPICALLY 4 TIMES A DAY, Disp: 100 g, Rfl: 0  .  Multiple Vitamin (MULTIVITAMIN) capsule, Take 1 capsule by mouth daily., Disp: 30 tablet, Rfl: 0  .  mycoPHENOLate mofetil (CELLCEPT) 500 MG tablet, Take 500 mg by mouth 2 times daily., Disp: , Rfl:   .  omeprazole (PRILOSEC) 20 MG capsule, Take 1 capsule (20 mg) by mouth every morning (before breakfast)., Disp: 90 capsule, Rfl: 3  .  valACYclovir (VALTREX) 500 MG tablet, Take 1 tablet (500 mg) by mouth daily., Disp: 90 tablet, Rfl: 3  Allergies:  No Known Allergies    FAMILY HISTORY:  Family History   Problem Relation Name Age of Onset   . Diabetes Mother          HTN, DJD   . Neurological Disease (Parkinsons/Huntingtons/Huntingtons Chorea) Father          DM, alcoholism   . Diabetes Father     . No Known Problems Sister     . Thyroid Brother     . Cataract Maternal Grandfather     . Cataract Paternal Grandfather         SOCIAL HISTORY:      reports that she has never smoked. She has never used smokeless tobacco. She reports previous alcohol use. She reports that she does not use drugs.    Review of Systems:  Review of Systems    Objective:     BP 134/71   Pulse 76   Temp 97.2 F (36.2 C) (Temporal)   Ht 5' (1.524 m)   Wt 93.6 kg (206 lb 6.4 oz)   SpO2 98%   BMI 40.31 kg/m     Physical Exam:  Physical Exam  Vitals reviewed.   Constitutional:       Appearance: Normal appearance.   Eyes:      Extraocular Movements: Extraocular movements  intact.      Conjunctiva/sclera: Conjunctivae normal.   Cardiovascular:      Rate and Rhythm: Normal rate and regular rhythm.      Heart sounds: No murmur heard.  Pulmonary:      Effort: Pulmonary effort is normal. No respiratory distress.      Breath sounds: Normal breath sounds. No wheezing or rales.   Abdominal:      General: There is no distension.      Tenderness: There is no abdominal tenderness. There is no right CVA tenderness or left CVA tenderness.   Neurological:      Mental Status: She is alert.         Labs and Imaging:  Lab Results   Component Value Date    HGB 13.7 09/28/2020    SODIUM 139 09/28/2020    K 4.2 09/28/2020    CREAT 0.8 09/28/2020    GFR >60 09/28/2020    AST 22 09/28/2020    ALT 20 09/28/2020    TBILI 0.3 09/28/2020    A1C 5.5 03/14/2021    CHOL 190 05/19/2019    TRIG 224 (H) 05/19/2019         Assessment & Plan:     Anita Turner was seen today for cough.    Diagnoses and all orders for this visit:    Dry cough  Chronic and thought to be secondary to recent COVID infection.  Recommended covid recovery clinic and referral to pulmonary.  -     Consult/Referral to General Pulmonary    Dysuria  Negative dip today other than RBC.  Will send urine for formal urinalysis and refer to specialty given worsening incontinence.  -     Urine Dipstick (POC)  -     Cancel: Urinalysis; Future  -     Consult/Referral to Uro-Gynecology  -     Urinalysis  -     Urinalysis with Culture Reflex, when indicated; Future  -     phenazopyridine (PYRIDIUM) 100 MG tablet; Take 1 tablet (100 mg) by mouth 3 times daily.    Female stress incontinence  Negative dip today other than RBC.  Will send urine for  formal urinalysis and refer to specialty given worsening incontinence.  -     Urine Dipstick (POC)  -     Cancel: Urinalysis; Future  -     Consult/Referral to Uro-Gynecology  -     Urinalysis  -     Cancel: Urinalysis with Culture Reflex, when indicated; Future  -     Urinalysis with Culture Reflex, when indicated  -      Urinalysis with Culture Reflex, when indicated; Future    Need for vaccination  -     Pneumococcal (PNEUMOVAX-23)  -     Tdap (BOOSTRIX or Adacel) for patients 28-70 years old    Routine Health Maintenance:  The 10-year ASCVD risk score (Arnett DK, et al., 2019) is: 1.2%    Values used to calculate the score:      Age: 28 years      Sex: Female      Is Non-Hispanic African American: No      Diabetic: No      Tobacco smoker: No      Systolic Blood Pressure: Q000111Q mmHg      Is BP treated: No      HDL Cholesterol: 48 MG/DL      Total Cholesterol: 190 MG/DL    Health Maintenance   Topic Date Due   . Pneumococcal Vaccine (1 - PCV) Never done   . Hepatitis C Screening  Never done   . HIV Screening  Never done   . Tetanus (1 - Tdap) Never done   . Colorectal Cancer Screening  Never done   . COVID-19 Vaccine (4 - Booster for Pfizer series) 02/27/2021   . Breast Cancer Screen  04/26/2021   . Influenza (1) 05/04/2021   . PHQ2 Depression Screen ([place])  09/13/2021   . Cervical Cancer Screening  12/27/2021   . Annual AUDIT Screen  02/26/2022   . Annual DAST Screen  02/26/2022   . Polio Vaccine  Aged Out   . HPV Vaccine <= 26 Yrs  Aged Out   . Meningococcal MCV4 Vaccine  Aged Out       Requested Prescriptions     Signed Prescriptions Disp Refills   . phenazopyridine (PYRIDIUM) 100 MG tablet 6 tablet 0     Sig: Take 1 tablet (100 mg) by mouth 3 times daily.       Follow Up: Return if symptoms worsen or fail to improve.    Future Appointments   Date Time Provider [place]   03/29/2021  4:40 PM Blenda Peals, Hunt Oris, MD [facility]   05/16/2021  9:40 AM Crosby Oyster, MD [place] [facility]   06/21/2021 10:30 AM Alvy Beal, MD [place] [id number]

## 2021-03-29 NOTE — Interdisciplinary (Signed)
Anita Turner is a 50 year old female here for cough that causes urination with burning sensation.

## 2021-03-29 NOTE — Interdisciplinary (Signed)
Vaccines verified by MD.

## 2021-03-29 NOTE — Patient Instructions (Signed)
 It was nice to see you in my clinic today. Thank you for allowing me to take part in your care. If I may be of any further assistance, please do not hesitate to contact [phone]. If you have not done so already, please contact the front desk on your wa

## 2021-03-30 LAB — URINALYSIS WITH CULTURE REFLEX, WHEN INDICATED
Bilirubin, UA: NEGATIVE
Glucose, UA: NEGATIVE MG/DL
Hemoglobin, UA: NEGATIVE
Ketones, UA: NEGATIVE MG/DL
Leukocyte Esterase, UA: NEGATIVE
Nitrite, UA: NEGATIVE
Protein, UA: NEGATIVE MG/DL
Specific Grav, UA: 1.002 — ABNORMAL LOW (ref 1.003–1.030)
UA Cult: NEGATIVE
Urobilinogen, UA: <2 MG/DL (ref <2.0)
WBC, UA: <1 #/HPF (ref 0–5)
pH, UA: 6 (ref 5.0–8.0)

## 2021-04-02 ENCOUNTER — Other Ambulatory Visit
Admission: RE | Admit: 2021-04-02 | Discharge: 2021-04-02 | Disposition: A | Discharge status: Home / Self Care | Payer: No Typology Code available for payment source | Attending: Rheumatology | Admitting: Rheumatology

## 2021-04-02 DIAGNOSIS — L100 Pemphigus vulgaris: Secondary | ICD-10-CM | POA: Insufficient documentation

## 2021-04-02 DIAGNOSIS — M2550 Pain in unspecified joint: Secondary | ICD-10-CM

## 2021-04-02 LAB — SED RATE, BLOOD: Sedimentation Rate: 5 MM/HR (ref 0–20)

## 2021-04-02 LAB — C-REACTIVE PROTEIN, BLOOD: C Reactive Protein: 0.5 MG/DL (ref 0.0–1.0)

## 2021-04-04 LAB — RHEUMATOID FACTOR: Rheumatoid Factor: <10 IU/mL (ref 0–14)

## 2021-04-05 LAB — SSA (RO)(ENA) ANTIBODY, IGG: SSA (RO)(ENA) Ab, IgG: 2 UNITS (ref 0–19)

## 2021-04-06 ENCOUNTER — Encounter: Payer: Self-pay | Admitting: Rheumatology

## 2021-04-07 ENCOUNTER — Encounter: Payer: Self-pay | Admitting: Dermatology

## 2021-04-09 LAB — CYCLIC CITRUL PEP AB, IGG: Anti-CCP Ab IgG: <5 CU (ref <20.0)

## 2021-04-10 ENCOUNTER — Encounter: Payer: Self-pay | Admitting: Dermatology

## 2021-04-10 MED ORDER — MYCOPHENOLATE MOFETIL 500 MG OR TABS
1000.0000 mg | ORAL_TABLET | Freq: Two times a day (BID) | ORAL | 3 refills | Status: DC
Start: 2021-04-10 — End: 2021-04-17

## 2021-04-10 NOTE — Telephone Encounter (Signed)
From: Azzie Glatter  To: Alvy Beal, MD  Sent: 04/06/2021 4:08 PM PDT  Subject: Test results     Good morning Doctor, I noticed that I got all my test results I don't understand them please can you explain hope everything is normal!   Thank you   Anita Turner

## 2021-04-11 ENCOUNTER — Encounter: Payer: Self-pay | Admitting: Dermatology

## 2021-04-12 NOTE — Telephone Encounter (Signed)
Patient has been scheduled for 04/15/2021 at 0930 am.

## 2021-04-12 NOTE — Telephone Encounter (Signed)
Patient returning [place] phone call regarding scheduling an appoitment with Dr. Thornell Mule. Call the clinic. Patient would like a call back today.    Please Assist, thank you

## 2021-04-15 ENCOUNTER — Ambulatory Visit: Payer: No Typology Code available for payment source | Admitting: Dermatology

## 2021-04-15 DIAGNOSIS — L100 Pemphigus vulgaris: Secondary | ICD-10-CM

## 2021-04-15 DIAGNOSIS — Z79899 Other long term (current) drug therapy: Secondary | ICD-10-CM

## 2021-04-15 NOTE — Progress Notes (Signed)
Department of Dermatology  [place]  PATIENT: Anita Turner  MRN: [id number]  DOB: [birth year]  DATE OF SERVICE: 04/15/2021    DERMATOLOGY FOLLOW-UP PATIENT VISIT     REFERRING PRACTITIONER: Anita Turner, Referred  PRIMARY CARE PROVIDER: Boyce Turner  CHIEF COMPLAINT:   Chief Complaint   Patient presents with    Follow Up     Pemphigus vulgaris      Subjective:     Anita Turner is a 50 year old very pleasant female who was seen in clinic for f/u pemphigus vulgaris and pemphigus foliaceous.     Recently resumed cellcept but in setting of increasing stress had new lesions and irritation in the mouth, with some bleeding. Now resolved.   No issues with resuming cellcept - on 1000 mg bid. Repeat DSG 1 4. DSG 3 0    Four days ago, patient states she started noticing a lesion in her left back mouth. She was brushing her teeth and it started bleeding. Continues to have dry mouth. Has noticed some white spots.    Has noticed a hardness on her lower abdomen. States they were raised, burning, itchy and has since flattened down.    No recurrent HSV (on ppx).    No vulvovaginal discharge, pain, burning, itching, or other symptoms.    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  Social History: Pt reports that she has never smoked. She has never used smokeless tobacco. She  reports previous alcohol use. She reports that she does not use drugs.  Family History:Pt family history includes Cataract in her maternal grandfather and paternal grandfather; Diabetes in her father and mother; Neurological Disease (Parkinsons/Huntingtons/Huntingtons Chorea) in her father; No Known Problems in her sister; Thyroid in her brother.  Home Medications: Pt has a current medication list which includes the following prescription(s): albuterol, benzonatate, bupropion, clonazepam, diclofenac, multivitamin, mycophenolate mofetil, omeprazole, phenazopyridine, and valacyclovir.  Allergies: Patient has no known allergies.    Objective:   General: Pleasant, in no acute distress, appears stated age  Psychiatric: Mood and affect are within normal limits  Respiratory: No increased work of breathing    There were no vitals filed for this visit.    Skin Exam:  Skin Type: IV  Patient declines a chaperone today  Pertinent skin findings below:  Skin exam performed including scalp, face, conjunctiva, lips, oral mucosa, hands, and the following was noted:    No oral lesions   Right maxillary gingiva with faint erythema, otherwise clear    02/07/2019   Basement Membrane Zone (BMZ) IgG, IgG4, and IgA  IgG: Negative, monkey esophagus substrate     Negative, human split skin substrate      IgG4: Negative, monkey esophagus substrate        Negative, human split skin substrate   IgA: Negative, monkey esophagus substrate     Negative, human split skin substrate   Positive IgG, including IgG4, cell surface antibodies,  monkey esophagus substrate     Enzyme Linked Immunosorbent Assay (ELISA)   --------------------------------------------------   Bullous Pemphigoid (BP) 180 and 230 IgG Antibodies   IgG BP 180 antibodies: 1 unit  NEGATIVE  IgG BP 230 antibodies: 1 unit  NEGATIVE    02/07/2019  Cell Surface IgG Antibodies   IgG: Positive, titer 1:2560 (H), monkey esophagus substrate     Positive, titer  1:640 (H), intact human skin substrate     Enzyme-Linked Immunosorbent Assay (ELISA)  -----------------------------------------  Desmoglein (DSG) 1 and 3 IgG Antibodies   IgG desmoglein 1 antibodies:  7 units  NEGATIVE    Reference Range:      Positive (H) = Greater than 20 units      Borderline/Indeterminate = 14-20 units      Negative = Less than 14 units   IgG desmoglein 3 antibodies: 320 units (H)  POSITIVE     (Initial level, 139 units, greater than high         calibrator; diluted to achieve 64 units, within      assay calibrators, and multiplied by the dilution      factor of 5)   COMMENTS   Specific   -------------------------------------------------   These indirect immunofluorescence results, demonstrating   positive IgG cell surface antibody reactivity on both   monkey esophagus and intact human skin substrates, support   the diagnosis of pemphigus.    Assessment/Plan     # Pemphigus vulgaris   # High risk medication monitoring  Patient previously followed by Anita Turner and prior [facility] including serologies for pemphigus/pemphigoid, CBC, CMP. She was previously on rituxan. Was also on IVIg monthly but stopped in November 2021 due to difficulty tolerating infusions.   Has no active clinical findings today and is asymptomatic. Denies vulvovaginal involvement.     - Discussed adding systemic steroids but patient prefers to hold off for now as no further oral lesions. Discussed if flaring to notify me  - Continue cellcept 1000 mg bid  - CBC and CMP reviewed from 09/28/20 and wnl, plan for repeat labs in one month (CBC and CMP ordered)  - DG levels ordered at previous visit, also previous A1c 5.5   - Sun protection/avoidance discussed     # Recurrent genital HSV, >6 episodes per year  Improvement with suppressive therapy, which is particularly important in the setting of her pemphigus as may be difficult to identify whether new symptomatology is related to HSV or  pemphigus  - Continue valtrex 500 mg daily as suppression     The above plan of care, diagnosis, orders, and follow-up were discussed with the patient. Risks, benefits, alternatives to therapy were discussed. Questions related to this recommended plan of care were answered.    RTC in 6-8 weeks    Marcelle Overlie, MD

## 2021-04-17 ENCOUNTER — Other Ambulatory Visit: Payer: Self-pay | Admitting: Dermatology

## 2021-04-17 MED ORDER — MYCOPHENOLATE MOFETIL 500 MG OR TABS
1000.0000 mg | ORAL_TABLET | Freq: Two times a day (BID) | ORAL | 3 refills | Status: DC
Start: 2021-04-17 — End: 2021-08-22

## 2021-04-17 NOTE — Telephone Encounter (Signed)
Pharmacy is requesting a 90 day supply

## 2021-04-25 ENCOUNTER — Encounter: Payer: Self-pay | Admitting: Internal Medicine

## 2021-04-25 ENCOUNTER — Ambulatory Visit: Payer: No Typology Code available for payment source | Admitting: Internal Medicine

## 2021-04-25 VITALS — BP 115/77 | HR 74 | Temp 97.8°F | Resp 16 | Ht 60.0 in | Wt 203.0 lb

## 2021-04-25 DIAGNOSIS — U099 Post covid-19 condition, unspecified: Secondary | ICD-10-CM

## 2021-04-25 DIAGNOSIS — R058 Other specified cough: Secondary | ICD-10-CM

## 2021-04-25 DIAGNOSIS — Z23 Encounter for immunization: Secondary | ICD-10-CM

## 2021-04-25 DIAGNOSIS — B948 Sequelae of other specified infectious and parasitic diseases: Secondary | ICD-10-CM

## 2021-04-25 DIAGNOSIS — Z8616 Personal history of COVID-19: Secondary | ICD-10-CM

## 2021-04-25 DIAGNOSIS — N393 Stress incontinence (female) (male): Secondary | ICD-10-CM

## 2021-04-25 MED ORDER — FAMOTIDINE 20 MG OR TABS
20.0000 mg | ORAL_TABLET | Freq: Every day | ORAL | 1 refills | Status: DC
Start: 2021-04-25 — End: 2021-05-19

## 2021-04-25 MED ORDER — N-ACETYL-L-CYSTEINE 600 MG PO CAPS
600.0000 mg | ORAL_CAPSULE | Freq: Every day | ORAL | 1 refills | Status: DC
Start: 2021-04-25 — End: 2021-05-13

## 2021-04-25 MED ORDER — BENZONATATE 100 MG OR CAPS
100.0000 mg | ORAL_CAPSULE | Freq: Three times a day (TID) | ORAL | 0 refills | Status: DC | PRN
Start: 2021-04-25 — End: 2021-11-21

## 2021-04-25 NOTE — Interdisciplinary (Signed)
Anita Turner is a 50 year old female is here for covid recovery.

## 2021-04-25 NOTE — Progress Notes (Signed)
[facility] Initial Visit Note    PATIENT:  Anita Turner  MRN:  [id number]  DOB:  03-15-1971  DATE OF SERVICE:  04/25/2021    PRIMARY CARE PROVIDER: Boyce Medici    Chief Complaint   Patient presents with   . 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     Patient was diagnosed with COVID19 on July 2022. Patient was not hospitalized or admitted into the ICU. No oxygen requirements. No treatment for COVID19.     Initial symptoms were: body pain and dry cough    Continues to have symptoms of: cough, gum bleeding (feels that she is bleeding, sensitive), urinary incontinence     Patient was previously evaluated: none  Rheumatology evaluation for joint pain; has acupuncture, massage    She is taking clove powder and honey    Prior work up: none    COVID-19 Infection History:  - Date of initial symptom onset (if known): 01/2021  - Date of positive COVID-19 test: 02/2021   - Initial symptoms: body pain and dry cough  - Required ED visit? _0  Yes   _1  No  - Required inpatient stay? _2  Yes   _3  No   - Required oxygen? _4  Yes   _5  No  ---- if yes, how much?  (nasal cannula, HFNC, BiPAP, etc)  - Required intubation? _6  Yes   _7  No  - Required ICU stay? _8  Yes   _9  No  ---- if yes, for how long?     - Medications received for COVID-19 infection:   _10  None    _11  Bamlanivumab (or other monoclonal antibody therapy)   _12  Convalescent plasma  _13  Dexamethasone    _14  Remdesivir (+/- baricitinib)   _15  IL-6 pathway inhibitors (eg tocilizumab, sarilumab, siltuximab)  _16  Antibiotics (eg if secondary bacterial infection was suspected/treated)  _17  Paxlovid (nirmatrelvir/ritonavir)     - Complications of [id number] infection:  _18  None    _19  Respiratory failure or ARDS  _20  Cardiac arrhythmias  _21  ACS  _22  Pulmonary  embolism  _23  Stroke  _24  Inflammatory or autoimmune response (eg Multisystem inflammatory syndrome in children, Guillain-Barre syndrome)  _25  Encephalopathy or encephalitis  _26  Seizure  _27  Renal injury  _28  Secondary infection  _29  Other:     Post-COVID symptoms:  _30  Home oxygen; if yes, how much?  _31  Shortness of breath  _32  Cough  _33  Chest pain or pressure  _34  Palpitations  _35  Fatigue  _36  Headache  _37  Brain fog  _38  Difficulty concentrating  _39  Impaired memory  _40  Dizziness or vertigo  _41  Anxiety  _42  Depression  _43  Change or loss of taste/smell  _44  Myalgias  _45  Joint pain; if yes, what joints?  _46  Deconditioning/generalized weakness  _47  Focal weakness (eg from stroke); if yes, what extremities?  _48  Nausea  _49  Diarrhea  _50  Abdominal pain  _51  Urinary incontinence or other issues  _52  Bowel incontinence or other issues    Current needs/functional status:  - Require assistance with ADLs? _53  Yes   _54  No  ---- If yes, with what?  - Require assistance with IADLs? _55  Yes   _56  No  ---- If yes, with what?  - Receiving home health? _57  Yes   _58  No  ---  If yes, what kind of care? (oxygen, home health aid, home health nurse, PT, OT, speech, wound care)  - Have any DME or DME needs? 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 Pemphigus vulgaris   . Pemphigus foliaceus   . Strain of muscle of right hip   . Abnormal EKG   . Chondromalacia of right patella   . Obesity with body mass index 30 or greater   . Class 2 obesity due to excess calories without serious comorbidity with body mass index (BMI) of 38.0 to 38.9 in adult   . Disorder of vitamin B12   . Ingrowing toenail   . Osteoarthritis of knee   . Pain of right thumb   . Patellofemoral stress syndrome   . Prediabetes   . Foot pain    . Shortness of breath   . Synovitis   . Nephrolithiasis   . Need for vaccination   . Vitreomacular adhesion of both eyes   . Age-related nuclear cataract of both eyes       Past Surgical History:   Past Surgical History:   Procedure Laterality Date   . CHOLECYSTECTOMY  2017   . APPENDECTOMY  2002       Current Medications:   Current Outpatient Medications   Medication Sig Dispense Refill   . Acetylcysteine (N-ACETYL-L-CYSTEINE) 600 MG CAPS Take 1 capsule (600 mg) by mouth daily. 30 capsule 1   . benzonatate (TESSALON) 100 MG capsule Take 1 capsule (100 mg) by mouth every 8 hours as needed for Cough. 30 capsule 0   . buPROPion (WELLBUTRIN XL) 150 MG XL tablet Take 1 tablet (150 mg) by mouth every morning. 90 tablet 3   . clonazePAM (KLONOPIN) 0.5 MG tablet TAKE 1 TABLET BY MOUTH 2 TIMES DAILY AS NEEDED FOR ANXIETY. 10 tablet 0   . diclofenac (VOLTAREN) 1 % gel APPLY 4 GRAM TOPICALLY 4 TIMES A DAY 100 g 0   . famotidine (PEPCID) 20 MG tablet Take 1 tablet (20 mg) by mouth daily. 30 tablet 1   . Multiple Vitamin (MULTIVITAMIN) capsule Take 1 capsule by mouth daily. 30 tablet 0   . mycoPHENOLate mofetil (CELLCEPT) 500 MG tablet Take 2 tablets (1,000 mg) by mouth 2 times daily. 360 tablet 3   . valACYclovir (VALTREX) 500 MG tablet Take 1 tablet (500 mg) by mouth daily. 90 tablet 3     No current facility-administered medications for this visit.        Allergies: Patient has no known allergies.    Family History:   Family History   Problem Relation Name Age of Onset   . Diabetes Mother          HTN, DJD   . Neurological Disease (Parkinsons/Huntingtons/Huntingtons Chorea) Father          DM, alcoholism   . Diabetes Father     . No Known Problems Sister     . Thyroid Brother     . Cataract Maternal Grandfather     . Cataract Paternal Grandfather         Social History:   Social History     Socioeconomic History   . Marital status: Married   Tobacco Use   .  Smoking status: Never Smoker   . Smokeless tobacco: Never Used    Substance and Sexual Activity   . Alcohol use: Not Currently   . Drug use: Never   . Sexual activity: Yes     Partners: Male   Social History Narrative    Married, one teenager daughter, no pets, homemaker, yoga, walks for exercise        As of 02-14-21       Review of Systems:   Pertinent positives and negatives noted in HPI, otherwise all other systems reviewed and negative.    OBJECTIVE:     BP 115/77 (BP Location: Left arm, BP Patient Position: Sitting, BP cuff size: Regular)   Pulse 74   Temp 97.8 F (36.6 C) (Temporal)   Resp 16   Ht 5' (1.524 m)   Wt 92.1 kg (203 lb)   SpO2 99%   BMI 39.65 kg/m  Body mass index is 39.65 kg/m.    General: Well-developed, well-nourished seated comfortably in NAD.  HEENT: NCAT. PERRLA, EOMI, no scleral icterus. Eyelids appear normal with no signs of ptosis, lid lag or lid edema. MMM, OP clear. B/l external ears normal, b/l TMs normal. Neck without obvious deformity on inspection, no thyromegaly or thyroid nodules, no cervical or supraclavicular lymphadenopathy.  CVS: RRR, no m/r/g.  Resp: Normal respiratory effort without accessory muscle use, CTAB no w/r/r.  Ext: WWP, no LE edema.  Skin: Warm and dry. No rashes or lesions noted.  Neuro: Alert and interactive, moving all extremities spontaneously, steady gait. CN II-XII grossly intact.   Psych: Appropriately ranging mood and affect. Appropriate responses to questions. Good judgment/insight.    Surveys/Questionnaires:  [place] PHQ9 DEPRESSION QUESTIONNAIRE 05/21/2020 09/13/2020 09/16/2020 09/17/2020 02/14/2021 03/13/2021   Interest _0 0 0   Depressed _1 0 0   Sleep _2 0   Energy 3 -- -- 3 0 0   Appetite _3 0 0 0   Failure _4 0 0   Concentration _5 0 0   Movement _6 0 0 0   Suicide 0 -- -- 0 -- 0   Summary(Manual) -- -- -- -- -- --   Summary(Calculated) 21 -- -- 13 -- 0   Functional Very difficult Somewhat difficult Somewhat difficult -- Not difficult at all Not difficult at all       GAD 7  09/17/2020   1. Feeling nervous, anxious or on edge 2   2. Not being able to stop or control worrying 2   3. Worrying too much about different things 2   4. Trouble relaxing 2   5. Being so restless that it is hard to sit still 0   6. Being easily annoyed or irritable 2   7. Feeling afraid as if something awful might happen 2   GAD7 Patient Total 12       No flowsheet data found.    No flowsheet data found. (if reporting neurocognitive symptoms. Otherwise, screen with Mini-cog)        Labs:  Results for orders placed or performed during the hospital encounter of 04/02/21   SSA (RO)(ENA) Antibody, IgG   Result Value Ref Range    SSA (RO)(ENA) Ab, IgG 2 0 - 19 UNITS   C-Reactive Protein, Blood Green Plasma Separator Tube   Result Value Ref Range    C Reactive Protein 0.5 0.0 - 1.0 MG/DL  Sedimentation Rate (ESR), Blood Lavender   Result Value Ref Range    Sedimentation Rate 5 0 - 20 MM/HR   Cyclic Citrul Peptide Antibody, IgG Yellow serum separator tube   Result Value Ref Range    Anti-CCP Ab IgG <5.0 <20.0 CU   Rheumatoid Factor, Blood   Result Value Ref Range    Rheumatoid Factor <10 0 - 14 IU/mL       Lab Results   Component Value Date    WBCCOUNT 9.2 09/28/2020    HGB 13.7 09/28/2020    HCT 41.2 09/28/2020    PLT 376 09/28/2020     Lab Results   Component Value Date    CREAT 0.8 09/28/2020    BUN 6 (L) 09/28/2020    K 4.2 09/28/2020    CL 105 09/28/2020    CO2 28 09/28/2020     Lab Results   Component Value Date    ALT 20 09/28/2020    AST 22 09/28/2020     Lab Results   Component Value Date    TSHHS 2.222 05/19/2019     No results found for: TPIHS, TROPI  No results found for: BNP, BNP, BNP, BNP  No results found for: CKTOTAL, XCKT, CPK  Lab Results   Component Value Date    CRPC 0.5 04/02/2021     No results found for: DIMER, DDIMER, DDIMERHS, DDI, DDT, DDIMER  No results found for: FERRITIN, FERRITIN, FERRITIN, FERRITIN, [place]    Radiology:  No results found.    No results found for this or any previous  visit.        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     1. Personal history of COVID-19  2. Sequelae of other specified infectious and parasitic diseases  3. Need for vaccination  - Flu vaccine (6 months +)    4. Dry cough  5. Female stress incontinence      Persistent post-COVID symptoms in a vaccinated/boosted patient. COVID infection happened with no treatment and did not require a higher level of care.    - discussed/provided with information on supplements, home exercises/ breathing exercises  - discussed trial of daily antihistamine +/- famotidine; discontinue omeprazole  - obtain labs  - start NAC daily   - stop use of albuterol, this is not helping   - follow up with acupuncture    - CBC w/ Diff Lavender; Future  - Comprehensive Metabolic Panel; Future  - D-Dimer Highly Sensitive, Blood Blue; Future  - Ferritin, Blood Green Plasma Separator Tube; Future  - Thyroid Cascade Green Plasma Separator Tube; Future  - Vitamin B12, Blood Green Plasma Separator Tube; Future  - Folic Acid, Blood Yellow serum separator tube; Future      The above plan of care, diagnosis, orders, and follow-up were discussed with the patient. Questions related to this recommended plan of care were answered.    Advised to return to clinic in 4 weeks for follow up, sooner PRN.    I personally spent 60 total minutes in face-to-face and non-face-to-face activities related to the patient's visit today, excluding interpretation services and any separately reportable services/procedures    Future Appointments   Date Time Provider [place]   04/25/2021  9:00 AM Gladis Riffle, DO [facility]   04/30/2021 11:00 AM Purnell, Ethel Rana, MD [facility]  [place]   05/01/2021 11:00 AM Liliane Bade, PT [place] [facility] PT [place] [facility]   05/02/2021  9:00 AM Rolly Salter, MD [place]   05/06/2021 10:15 AM Liliane Bade, PT [place] [facility] PT [place] - [facility]   05/09/2021 11:00 AM Barbaraann Boys, [place]   05/14/2021 11:00 AM Liliane Bade, PT [place] MAN PT [facility] - Man   05/16/2021  9:40 AM Crosby Oyster, MD [place] [facility] [place]   06/21/2021 10:30 AM Alvy Beal, MD [facility] [address]       [place], DO 04/25/2021 11:14 AM

## 2021-04-25 NOTE — Interdisciplinary (Signed)
Verified Flu vaccine via intramuscular per Dr.Leong   order.

## 2021-04-26 ENCOUNTER — Encounter: Payer: Self-pay | Admitting: Internal Medicine

## 2021-04-30 ENCOUNTER — Encounter: Payer: Self-pay | Admitting: Family Practice

## 2021-04-30 ENCOUNTER — Ambulatory Visit: Payer: No Typology Code available for payment source | Admitting: Family Practice

## 2021-04-30 VITALS — BP 123/78 | HR 79 | Temp 97.6°F | Resp 16 | Ht 60.0 in | Wt 203.0 lb

## 2021-04-30 DIAGNOSIS — R5383 Other fatigue: Secondary | ICD-10-CM

## 2021-04-30 DIAGNOSIS — Z1321 Encounter for screening for nutritional disorder: Secondary | ICD-10-CM

## 2021-04-30 DIAGNOSIS — L102 Pemphigus foliaceous: Secondary | ICD-10-CM

## 2021-04-30 DIAGNOSIS — Z6839 Body mass index (BMI) 39.0-39.9, adult: Secondary | ICD-10-CM

## 2021-04-30 DIAGNOSIS — M79609 Pain in unspecified limb: Secondary | ICD-10-CM

## 2021-04-30 DIAGNOSIS — G4700 Insomnia, unspecified: Secondary | ICD-10-CM

## 2021-04-30 DIAGNOSIS — F439 Reaction to severe stress, unspecified: Secondary | ICD-10-CM

## 2021-04-30 DIAGNOSIS — M25561 Pain in right knee: Secondary | ICD-10-CM

## 2021-04-30 DIAGNOSIS — Z9049 Acquired absence of other specified parts of digestive tract: Secondary | ICD-10-CM

## 2021-04-30 DIAGNOSIS — M25562 Pain in left knee: Secondary | ICD-10-CM

## 2021-04-30 NOTE — Progress Notes (Signed)
 Name: Anita Turner   DOB: 1970-10-21   PCP: Hanley Hays, MD (General)      Chief Complaints: arthralgia ( knees, hips, finger)  HPI: Anita Turner is a 50 year old female with arthralgia ( knees hips, fingers) since 4-5 yrs ago. Pt with high stress - m

## 2021-04-30 NOTE — Patient Instructions (Addendum)
 AFTER VISIT CHECK OUT LOGISTICS:     Upon check out, our Medical Assistant will connect you to go over the recommendation. For detailed recommendation of today's visit, please see AVS  (1) handouts need to be sent :none  (2) Supplements ( see AVS) please s

## 2021-05-01 ENCOUNTER — Ambulatory Visit: Payer: No Typology Code available for payment source | Attending: Rheumatology

## 2021-05-01 DIAGNOSIS — M2550 Pain in unspecified joint: Secondary | ICD-10-CM | POA: Insufficient documentation

## 2021-05-01 DIAGNOSIS — M25561 Pain in right knee: Secondary | ICD-10-CM | POA: Insufficient documentation

## 2021-05-01 DIAGNOSIS — L100 Pemphigus vulgaris: Secondary | ICD-10-CM | POA: Insufficient documentation

## 2021-05-01 DIAGNOSIS — M25562 Pain in left knee: Secondary | ICD-10-CM | POA: Insufficient documentation

## 2021-05-01 NOTE — Interdisciplinary (Signed)
 Physical Therapy Evaluation    Ordering Physician Rico Sheehan    Diagnosis     ICD-10-CM ICD-9-CM    1. Pain in both knees, unspecified chronicity  M25.561 719.46     M25.562     2. Pain in joint, multiple sites  M25.50 719.49    3. Pemphigus vu

## 2021-05-02 ENCOUNTER — Ambulatory Visit: Payer: No Typology Code available for payment source | Admitting: Internal Medicine

## 2021-05-02 ENCOUNTER — Encounter: Payer: Self-pay | Admitting: Internal Medicine

## 2021-05-02 ENCOUNTER — Encounter: Payer: Self-pay | Admitting: Family Practice

## 2021-05-02 VITALS — BP 135/85 | HR 83 | Temp 97.5°F | Ht 60.0 in | Wt 203.0 lb

## 2021-05-02 DIAGNOSIS — L102 Pemphigus foliaceous: Secondary | ICD-10-CM

## 2021-05-02 DIAGNOSIS — R32 Unspecified urinary incontinence: Secondary | ICD-10-CM | POA: Insufficient documentation

## 2021-05-02 DIAGNOSIS — R7303 Prediabetes: Secondary | ICD-10-CM

## 2021-05-02 DIAGNOSIS — N393 Stress incontinence (female) (male): Secondary | ICD-10-CM

## 2021-05-02 DIAGNOSIS — E785 Hyperlipidemia, unspecified: Secondary | ICD-10-CM

## 2021-05-02 DIAGNOSIS — K760 Fatty (change of) liver, not elsewhere classified: Secondary | ICD-10-CM

## 2021-05-02 DIAGNOSIS — Z6838 Body mass index (BMI) 38.0-38.9, adult: Secondary | ICD-10-CM

## 2021-05-02 NOTE — Patient Instructions (Signed)
 Thank you very much for coming in for this visit.  Reviewing ultrasound discussed previous evidence of fatty liver.    Discussed at the current BMI that you qualify for bariatric surgery as long-term option discussed be considered in the future if not opti

## 2021-05-02 NOTE — Progress Notes (Signed)
 Integrative Bariatric Medicine consultation  PCP: Dr Hanley Hays  Referred by: Dr. Rozell Searing     Date:05/02/2021    Anita Turner is a 50 year old female who is here for evaluation of the above chief complaint.  Weight management    Before steroids 170 in 201

## 2021-05-03 ENCOUNTER — Other Ambulatory Visit
Admission: RE | Admit: 2021-05-03 | Discharge: 2021-05-03 | Disposition: A | Discharge status: Home / Self Care | Payer: No Typology Code available for payment source | Attending: Family Practice | Admitting: Family Practice

## 2021-05-03 ENCOUNTER — Encounter: Payer: Self-pay | Admitting: Internal Medicine

## 2021-05-03 DIAGNOSIS — Z713 Dietary counseling and surveillance: Secondary | ICD-10-CM | POA: Insufficient documentation

## 2021-05-03 DIAGNOSIS — E785 Hyperlipidemia, unspecified: Secondary | ICD-10-CM | POA: Insufficient documentation

## 2021-05-03 DIAGNOSIS — R7303 Prediabetes: Secondary | ICD-10-CM | POA: Insufficient documentation

## 2021-05-03 DIAGNOSIS — Z6839 Body mass index (BMI) 39.0-39.9, adult: Secondary | ICD-10-CM | POA: Insufficient documentation

## 2021-05-03 DIAGNOSIS — Z1321 Encounter for screening for nutritional disorder: Secondary | ICD-10-CM

## 2021-05-03 DIAGNOSIS — B948 Sequelae of other specified infectious and parasitic diseases: Secondary | ICD-10-CM

## 2021-05-03 LAB — CBC WITH DIFF, BLOOD
ANC automated: 4.3 10*3/uL (ref 2.0–8.1)
Basophils %: 1 %
Basophils Absolute: 0.1 10*3/uL (ref 0.0–0.2)
Eosinophils %: 2.9 %
Eosinophils Absolute: 0.2 10*3/uL (ref 0.0–0.5)
Hematocrit: 37.9 % (ref 34.0–44.0)
Hgb: 12.8 G/DL (ref 11.5–15.0)
Lymphocytes %: 38.1 %
Lymphocytes Absolute: 3.3 10*3/uL (ref 0.9–3.3)
MCH: 28.6 PG (ref 27.0–33.5)
MCHC: 33.9 G/DL (ref 32.0–35.5)
MCV: 84.6 FL (ref 81.5–97.0)
MPV: 8.5 FL (ref 7.2–11.7)
Monocytes %: 7.7 %
Monocytes Absolute: 0.7 10*3/uL (ref 0.0–0.8)
Neutrophils % (A): 50.3 %
PLT Count: 362 10*3/uL (ref 150–400)
RBC: 4.48 10*6/uL (ref 3.70–5.00)
RDW-CV: 14 % (ref 11.6–14.4)
White Bld Cell Count: 8.5 10*3/uL (ref 4.0–10.5)

## 2021-05-03 LAB — COMPREHENSIVE METABOLIC PANEL, BLOOD
ALT: 16 U/L (ref 7–52)
AST: 20 U/L (ref 13–39)
Albumin: 4 G/DL (ref 3.7–5.3)
Alk Phos: 75 U/L (ref 34–104)
BUN: 6 mg/dL — ABNORMAL LOW (ref 7–25)
Bilirubin, Total: 0.3 mg/dL (ref 0.0–1.4)
CO2: 26 mmol/L (ref 21–31)
Calcium: 8.6 mg/dL (ref 8.6–10.3)
Chloride: 104 mmol/L (ref 98–107)
Creat: 0.7 mg/dL (ref 0.6–1.2)
Electrolyte Balance: 6 mmol/L (ref 2–12)
Glucose: 110 mg/dL (ref 70–115)
Potassium: 3.9 mmol/L (ref 3.5–5.1)
Protein, Total: 6.1 G/DL (ref 6.0–8.3)
Sodium: 136 mmol/L (ref 136–145)
eGFR - high estimate: >60 (ref >59)
eGFR - low estimate: >60 (ref >59)

## 2021-05-03 LAB — D-DIMER HIGHLY SENSITIVE, BLOOD: D-Dimer Thrombosis Ultra: <270 ng/mL FEU (ref <500)

## 2021-05-03 LAB — VITAMIN B12, BLOOD: Vitamin B12 Lvl: 152 PG/ML — ABNORMAL LOW (ref 180–1241)

## 2021-05-03 LAB — FOLIC ACID, BLOOD: Folate, Srm: 20.2 NG/ML (ref >5.9)

## 2021-05-03 LAB — VITAMIN D, 25-OH TOTAL: Vitamin D, 25-Hydroxy, Total: 30.5 ng/mL (ref >30.0)

## 2021-05-03 LAB — THYROID CASCADE: TSH, Ultrasensitive: 1.945 u[IU]/mL (ref 0.450–4.120)

## 2021-05-03 LAB — FERRITIN, BLOOD: Ferritin: 24 NG/ML (ref 10–107)

## 2021-05-06 ENCOUNTER — Ambulatory Visit: Payer: No Typology Code available for payment source

## 2021-05-07 ENCOUNTER — Encounter: Payer: Self-pay | Admitting: Family Practice

## 2021-05-07 ENCOUNTER — Encounter: Payer: No Typology Code available for payment source | Admitting: Massage Therapy

## 2021-05-07 DIAGNOSIS — R7989 Other specified abnormal findings of blood chemistry: Secondary | ICD-10-CM

## 2021-05-07 DIAGNOSIS — Z6839 Body mass index (BMI) 39.0-39.9, adult: Secondary | ICD-10-CM

## 2021-05-07 DIAGNOSIS — E785 Hyperlipidemia, unspecified: Secondary | ICD-10-CM

## 2021-05-09 ENCOUNTER — Ambulatory Visit: Payer: No Typology Code available for payment source | Admitting: Acupuncturist

## 2021-05-09 DIAGNOSIS — G8929 Other chronic pain: Secondary | ICD-10-CM

## 2021-05-09 DIAGNOSIS — M545 Low back pain, unspecified: Secondary | ICD-10-CM

## 2021-05-09 DIAGNOSIS — M25562 Pain in left knee: Secondary | ICD-10-CM

## 2021-05-09 DIAGNOSIS — M25561 Pain in right knee: Secondary | ICD-10-CM

## 2021-05-09 NOTE — Progress Notes (Signed)
 [facility]      Brittnye Josephs, LAc      Licensed Acupuncturist  FOLLOW UP OFFICE VISIT      Patient Name: Anita Turner    DATE OF VISIT: 05/09/2021    VISIT START TIME: 1100  VISIT END TIME:1200 30 min intake insertion stimulation and removal of needl

## 2021-05-10 ENCOUNTER — Telehealth: Payer: Self-pay | Admitting: Dermatology

## 2021-05-10 ENCOUNTER — Ambulatory Visit: Payer: No Typology Code available for payment source | Admitting: Massage Therapy

## 2021-05-10 DIAGNOSIS — M25561 Pain in right knee: Secondary | ICD-10-CM

## 2021-05-10 NOTE — Telephone Encounter (Signed)
Spoke to patient and send message to MD per patient request

## 2021-05-10 NOTE — Telephone Encounter (Signed)
-----  Message from [facility] sent at 05/10/2021 10:12 AM PDT -----  Regarding: Appointment Request  Contact: [phone]  Appointment Request From: Azzie Glatter    With Provider: Crosby Oyster, MD [place]    Preferred Date Range: Any date 05/10/2021 or later    Preferred Times: Any Time    Reason for visit: Medical issue    Comments:  My upper and lower lips internal are getting very dry

## 2021-05-12 NOTE — Progress Notes (Signed)
 Subjective:   Anita Turner is a 50 year old female who is here for ER F/U (Light headed )        HPI    Anita Turner is a 50 year old female here for ER follow up and evaluation of dry mouth.      05-06-21 was seen at [facility] ER for near syncope, CT

## 2021-05-13 ENCOUNTER — Encounter: Payer: Self-pay | Admitting: Family Medicine

## 2021-05-13 ENCOUNTER — Ambulatory Visit: Payer: No Typology Code available for payment source | Admitting: Family Medicine

## 2021-05-13 VITALS — BP 135/77 | HR 80 | Temp 97.9°F | Resp 16 | Ht 60.0 in | Wt 202.0 lb

## 2021-05-13 DIAGNOSIS — Z8616 Personal history of COVID-19: Secondary | ICD-10-CM | POA: Insufficient documentation

## 2021-05-13 DIAGNOSIS — R058 Other specified cough: Secondary | ICD-10-CM

## 2021-05-13 DIAGNOSIS — K117 Disturbances of salivary secretion: Secondary | ICD-10-CM

## 2021-05-13 NOTE — Interdisciplinary (Signed)
Pt states here today er follow up light headed

## 2021-05-14 ENCOUNTER — Ambulatory Visit: Payer: No Typology Code available for payment source

## 2021-05-16 ENCOUNTER — Ambulatory Visit: Payer: No Typology Code available for payment source | Admitting: Internal Medicine

## 2021-05-16 ENCOUNTER — Ambulatory Visit: Payer: No Typology Code available for payment source | Admitting: Dermatology

## 2021-05-16 ENCOUNTER — Encounter: Payer: Self-pay | Admitting: Internal Medicine

## 2021-05-16 VITALS — BP 134/64 | HR 74 | Temp 97.2°F | Ht 60.0 in | Wt 203.0 lb

## 2021-05-16 VITALS — Temp 97.8°F

## 2021-05-16 DIAGNOSIS — Z6838 Body mass index (BMI) 38.0-38.9, adult: Secondary | ICD-10-CM

## 2021-05-16 DIAGNOSIS — L100 Pemphigus vulgaris: Secondary | ICD-10-CM

## 2021-05-16 DIAGNOSIS — K117 Disturbances of salivary secretion: Secondary | ICD-10-CM

## 2021-05-16 DIAGNOSIS — E538 Deficiency of other specified B group vitamins: Secondary | ICD-10-CM

## 2021-05-16 DIAGNOSIS — Z7689 Persons encountering health services in other specified circumstances: Secondary | ICD-10-CM

## 2021-05-16 DIAGNOSIS — Z0000 Encounter for general adult medical examination without abnormal findings: Secondary | ICD-10-CM

## 2021-05-16 DIAGNOSIS — Z79899 Other long term (current) drug therapy: Secondary | ICD-10-CM

## 2021-05-16 DIAGNOSIS — Z8616 Personal history of COVID-19: Secondary | ICD-10-CM

## 2021-05-16 DIAGNOSIS — Z1231 Encounter for screening mammogram for malignant neoplasm of breast: Secondary | ICD-10-CM

## 2021-05-16 DIAGNOSIS — L102 Pemphigus foliaceous: Secondary | ICD-10-CM

## 2021-05-16 MED ORDER — CLOTRIMAZOLE 10 MG MT TROC
10.0000 mg | Freq: Every day | OROMUCOSAL | 0 refills | Status: DC
Start: 2021-05-16 — End: 2021-11-21

## 2021-05-16 MED ORDER — CYANOCOBALAMIN 1000 MCG OR TBCR
1000.0000 ug | EXTENDED_RELEASE_TABLET | Freq: Every day | ORAL | 3 refills | Status: DC
Start: 2021-05-16 — End: 2021-08-22

## 2021-05-16 NOTE — Patient Instructions (Addendum)
 Thank you for establishing care with a new provider at the [facility] clinic. Interval lab work (cholesterol panel, vitamin B12, CBC) has been ordered - please complete this on a fasting basis in 2-3 months for further evaluation and schedule a subs

## 2021-05-16 NOTE — Interdisciplinary (Signed)
Pt here to establish care and would like to discuss Vitamin B12

## 2021-05-16 NOTE — Progress Notes (Signed)
 Department of Dermatology  [facility]  PATIENT: Anita Turner  MRN: [id number]  DOB: 12-Nov-1970  DATE OF SERVICE: 05/16/2021    DERMATOLOGY FOLLOW-UP PATIENT VISIT     REFERRING PRACTITIONER: Karma Greaser  PRIMARY CARE PROVIDER: Laurel Dimmer

## 2021-05-16 NOTE — Progress Notes (Signed)
 [facility] - Internal Medicine  Date of Visit: 05/16/21    History and Physical - Establish Care    Chief Concern:   Chief Complaint   Patient presents with   . Establish Care      History of Present Illness  Anita Turner is a 50 year old female who presen

## 2021-05-18 ENCOUNTER — Other Ambulatory Visit: Payer: Self-pay | Admitting: Internal Medicine

## 2021-05-19 MED ORDER — FAMOTIDINE 20 MG OR TABS
ORAL_TABLET | ORAL | 1 refills | Status: DC
Start: 2021-05-19 — End: 2021-05-23

## 2021-05-20 ENCOUNTER — Ambulatory Visit: Payer: No Typology Code available for payment source

## 2021-05-22 ENCOUNTER — Ambulatory Visit: Payer: No Typology Code available for payment source

## 2021-05-23 ENCOUNTER — Ambulatory Visit: Payer: No Typology Code available for payment source | Admitting: Internal Medicine

## 2021-05-23 ENCOUNTER — Encounter: Payer: Self-pay | Admitting: Internal Medicine

## 2021-05-23 VITALS — BP 126/74 | HR 81 | Temp 97.9°F | Resp 14 | Ht 60.0 in | Wt 201.6 lb

## 2021-05-23 DIAGNOSIS — N393 Stress incontinence (female) (male): Secondary | ICD-10-CM

## 2021-05-23 DIAGNOSIS — R053 Chronic cough: Secondary | ICD-10-CM

## 2021-05-23 DIAGNOSIS — R682 Dry mouth, unspecified: Secondary | ICD-10-CM

## 2021-05-23 DIAGNOSIS — E538 Deficiency of other specified B group vitamins: Secondary | ICD-10-CM

## 2021-05-23 DIAGNOSIS — U099 Post covid-19 condition, unspecified: Secondary | ICD-10-CM

## 2021-05-23 MED ORDER — FAMOTIDINE 20 MG OR TABS
10.0000 mg | ORAL_TABLET | Freq: Every day | ORAL | 1 refills | Status: DC
Start: 2021-05-23 — End: 2021-11-21

## 2021-05-23 MED ORDER — N-ACETYL-L-CYSTEINE 600 MG PO CAPS
600.0000 mg | ORAL_CAPSULE | Freq: Every day | ORAL | 1 refills | Status: DC
Start: 2021-05-23 — End: 2021-11-21

## 2021-05-23 MED ORDER — CYANOCOBALAMIN 1000 MCG/ML IJ SOLN
1000.0000 ug | INTRAMUSCULAR | Status: AC
Start: 2021-05-23 — End: 2021-06-13
  Administered 2021-05-23 – 2021-06-13 (×5): 1000 ug via INTRAMUSCULAR

## 2021-05-23 NOTE — Progress Notes (Signed)
 [facility] COVID-19 Recovery Clinic Visit Note    PATIENT:  Anita Turner  MRN:  [id number]  DOB:  23-Apr-1971  DATE OF SERVICE:  05/23/2021    PRIMARY CARE PROVIDER: Laurel Dimmer    Chief Complaint   Patient presents with   . Post Covid-19 Syndrome        SUB

## 2021-05-23 NOTE — Interdisciplinary (Signed)
Anita Turner is a 50 year old female  Is here today for covid recovery         -states cough has returned    -has developed dry mouth x10days    -stopped all meds

## 2021-05-27 ENCOUNTER — Ambulatory Visit: Payer: No Typology Code available for payment source

## 2021-05-30 ENCOUNTER — Ambulatory Visit: Payer: No Typology Code available for payment source

## 2021-05-30 ENCOUNTER — Ambulatory Visit: Payer: No Typology Code available for payment source | Admitting: Acupuncturist

## 2021-05-30 DIAGNOSIS — E538 Deficiency of other specified B group vitamins: Secondary | ICD-10-CM

## 2021-05-30 NOTE — Interdisciplinary (Signed)
Anita Turner is a 50 year old female here for B-12 inj, right deltoid via intramuscular, pt tolerated well.

## 2021-05-31 ENCOUNTER — Ambulatory Visit: Payer: No Typology Code available for payment source | Admitting: Dermatology

## 2021-05-31 DIAGNOSIS — K117 Disturbances of salivary secretion: Secondary | ICD-10-CM

## 2021-05-31 MED ORDER — PREDNISONE 10 MG OR TABS
ORAL_TABLET | ORAL | 0 refills | Status: DC
Start: 2021-05-31 — End: 2021-06-25

## 2021-05-31 NOTE — Progress Notes (Signed)
 Department of Dermatology  [facility]  PATIENT: Anita Turner  MRN: [id number]  DOB: [birth year]  DATE OF SERVICE: 05/31/2021    DERMATOLOGY FOLLOW-UP PATIENT VISIT     REFERRING PRACTITIONER: Laurel Dimmer  PRIMARY CARE PROVIDER: Laurel Dimmer  CHIEF COM

## 2021-05-31 NOTE — Interdisciplinary (Signed)
Anita Turner is a 50 year old female  Is here today for follow up      -dry mouth

## 2021-06-04 ENCOUNTER — Ambulatory Visit: Payer: No Typology Code available for payment source

## 2021-06-05 ENCOUNTER — Telehealth: Payer: Self-pay | Admitting: Ob/Gyn

## 2021-06-05 NOTE — Telephone Encounter (Signed)
Spoke to pt and informed of message from dr Philmore Pali and placed on waitlist

## 2021-06-05 NOTE — Telephone Encounter (Signed)
 Patient called and is scheduled for next appointment 12/21 with Dr. Learta Codding. However, patient stated that she would like to know if Dr. Learta Codding can accommodate her sooner for the concern of itching and burning in her vagina. I offered options as far as me chec

## 2021-06-06 ENCOUNTER — Ambulatory Visit: Payer: No Typology Code available for payment source

## 2021-06-06 DIAGNOSIS — E538 Deficiency of other specified B group vitamins: Secondary | ICD-10-CM

## 2021-06-06 NOTE — Interdisciplinary (Signed)
Anita Turner is a 50 year old female here B-12 inj via intramuscular, pt tolerated well.

## 2021-06-09 ENCOUNTER — Encounter: Payer: Self-pay | Admitting: Dermatology

## 2021-06-10 ENCOUNTER — Encounter: Payer: Self-pay | Admitting: Dermatology

## 2021-06-10 NOTE — Telephone Encounter (Addendum)
Please advise 

## 2021-06-10 NOTE — Telephone Encounter (Addendum)
Please see photo bellow and advise.

## 2021-06-10 NOTE — Telephone Encounter (Signed)
Spoke with pt and she stated that she will wait to see if medication helps and if not she will let us know for an appointment.

## 2021-06-11 ENCOUNTER — Ambulatory Visit: Payer: No Typology Code available for payment source

## 2021-06-13 ENCOUNTER — Ambulatory Visit: Payer: No Typology Code available for payment source | Admitting: Acupuncturist

## 2021-06-13 ENCOUNTER — Ambulatory Visit (INDEPENDENT_AMBULATORY_CARE_PROVIDER_SITE_OTHER): Payer: No Typology Code available for payment source

## 2021-06-13 DIAGNOSIS — E538 Deficiency of other specified B group vitamins: Secondary | ICD-10-CM

## 2021-06-13 NOTE — Interdisciplinary (Signed)
Anita Turner is a 50 year old female b12 injection

## 2021-06-18 ENCOUNTER — Ambulatory Visit: Payer: No Typology Code available for payment source

## 2021-06-18 ENCOUNTER — Encounter: Payer: Self-pay | Admitting: Dermatology

## 2021-06-19 ENCOUNTER — Telehealth: Payer: Self-pay | Admitting: Dermatology

## 2021-06-19 NOTE — Telephone Encounter (Signed)
 Patient called requesting a call back stat in regards to a my chart message that has not received a response as of yet and is also requesting an urgent sooner appointment as appointment available at the moment does not meet her needs. Please assist,thank y

## 2021-06-19 NOTE — Telephone Encounter (Signed)
Patient called requesting to speak with claudia as patient is returning her call. Called clinic no answer unable to LVM as line rang profusely. Please assist, thank you.

## 2021-06-19 NOTE — Telephone Encounter (Signed)
Called pt and left her a message to please call the office back regarding her request for a sooner appointment.

## 2021-06-20 ENCOUNTER — Ambulatory Visit: Payer: No Typology Code available for payment source | Admitting: Dermatology

## 2021-06-20 DIAGNOSIS — L100 Pemphigus vulgaris: Secondary | ICD-10-CM

## 2021-06-20 DIAGNOSIS — L304 Erythema intertrigo: Secondary | ICD-10-CM

## 2021-06-20 DIAGNOSIS — Z79899 Other long term (current) drug therapy: Secondary | ICD-10-CM

## 2021-06-20 DIAGNOSIS — B009 Herpesviral infection, unspecified: Secondary | ICD-10-CM

## 2021-06-20 MED ORDER — NYSTATIN-TRIAMCINOLONE 100000-0.1 UNIT/GM-% EX CREA
1.0000 | TOPICAL_CREAM | Freq: Two times a day (BID) | CUTANEOUS | 2 refills | Status: DC
Start: 2021-06-20 — End: 2021-08-22

## 2021-06-20 NOTE — Telephone Encounter (Addendum)
Pt has been added to the schedule for today.

## 2021-06-20 NOTE — Telephone Encounter (Addendum)
Spoke with pt and she was offer an appointment for today and she verbally agreed to be added to the schedule.

## 2021-06-21 ENCOUNTER — Ambulatory Visit: Payer: No Typology Code available for payment source

## 2021-06-21 ENCOUNTER — Encounter: Payer: Self-pay | Admitting: Rheumatology

## 2021-06-21 ENCOUNTER — Ambulatory Visit: Payer: No Typology Code available for payment source | Attending: Rheumatology | Admitting: Rheumatology

## 2021-06-21 VITALS — BP 107/69 | HR 94 | Temp 98.1°F | Resp 16 | Ht 60.0 in | Wt 205.0 lb

## 2021-06-21 DIAGNOSIS — M79644 Pain in right finger(s): Secondary | ICD-10-CM

## 2021-06-21 DIAGNOSIS — R682 Dry mouth, unspecified: Secondary | ICD-10-CM

## 2021-06-21 DIAGNOSIS — Z6841 Body Mass Index (BMI) 40.0 and over, adult: Secondary | ICD-10-CM

## 2021-06-21 MED ORDER — PILOCARPINE HCL 5 MG OR TABS
ORAL_TABLET | ORAL | 0 refills | Status: AC
Start: 2021-06-21 — End: 2021-08-02

## 2021-06-21 NOTE — Patient Instructions (Addendum)
 Start omega 3 supplementation  Start vitamin D supplementation 1000 international units  Stay active    [email or web address]

## 2021-06-21 NOTE — Progress Notes (Signed)
 Rheumatology Follow-Up Note    Encounter Date: 06/21/2021     Chief Complaint: Follow up arthralgia    History of Present Illness:  Dry mouth:  Thinks that this has increased since the initiation of immunsuppresants.  Joint pain: almost entirely resolved.

## 2021-06-21 NOTE — Progress Notes (Signed)
 Department of Dermatology  [facility]  PATIENT: Anita Turner  MRN: [id number]  DOB: Dec 03, 1970  DATE OF SERVICE: 06/20/2021    DERMATOLOGY FOLLOW-UP PATIENT VISIT     REFERRING PRACTITIONER: Karma Greaser  PRIMARY CARE PROVIDER: Laurel Dimmer

## 2021-06-24 ENCOUNTER — Other Ambulatory Visit: Payer: Self-pay | Admitting: Dermatology

## 2021-06-25 ENCOUNTER — Ambulatory Visit: Payer: No Typology Code available for payment source

## 2021-06-25 ENCOUNTER — Ambulatory Visit
Admission: RE | Admit: 2021-06-25 | Discharge: 2021-06-25 | Disposition: A | Discharge status: Home / Self Care | Payer: No Typology Code available for payment source | Attending: Internal Medicine | Admitting: Internal Medicine

## 2021-06-25 ENCOUNTER — Ambulatory Visit: Payer: No Typology Code available for payment source | Admitting: Internal Medicine

## 2021-06-25 DIAGNOSIS — Z1231 Encounter for screening mammogram for malignant neoplasm of breast: Secondary | ICD-10-CM | POA: Insufficient documentation

## 2021-06-25 DIAGNOSIS — Z0000 Encounter for general adult medical examination without abnormal findings: Secondary | ICD-10-CM | POA: Insufficient documentation

## 2021-06-25 MED ORDER — PREDNISONE 10 MG OR TABS
ORAL_TABLET | ORAL | 0 refills | Status: AC
Start: 2021-06-25 — End: 2021-07-23

## 2021-07-02 ENCOUNTER — Ambulatory Visit: Payer: No Typology Code available for payment source

## 2021-07-04 ENCOUNTER — Ambulatory Visit: Payer: No Typology Code available for payment source | Admitting: Acupuncturist

## 2021-07-08 ENCOUNTER — Encounter: Payer: Self-pay | Admitting: Massage Therapy

## 2021-07-08 NOTE — Progress Notes (Signed)
 Massage visit type:   [x]  Initial visit  []  Follow up visit      SUBJECTIVE:       Anita Turner is a 50 yo F who presents today for massage therapy. Referral by Dr. Clabe Seal     Patient's main concern today is: Bilateral lower leg arthralgia, h/o autoimmun

## 2021-07-10 ENCOUNTER — Encounter: Payer: Self-pay | Admitting: Dermatology

## 2021-07-10 MED ORDER — PREDNISONE 1 MG OR TABS
5.0000 mg | ORAL_TABLET | Freq: Every day | ORAL | 2 refills | Status: DC
Start: 2021-07-10 — End: 2021-11-21

## 2021-07-10 NOTE — Telephone Encounter (Addendum)
Please see photo and advise.

## 2021-07-17 ENCOUNTER — Telehealth: Payer: Self-pay | Admitting: Internal Medicine

## 2021-07-17 NOTE — Telephone Encounter (Signed)
Spoke to patient to confirm appt for tomorrow with Dr. Julianne Handler, patient confirmed she has new address and ph.

## 2021-07-18 ENCOUNTER — Ambulatory Visit: Payer: No Typology Code available for payment source | Admitting: Acupuncturist

## 2021-07-18 ENCOUNTER — Encounter: Payer: Self-pay | Admitting: Internal Medicine

## 2021-07-18 ENCOUNTER — Ambulatory Visit: Payer: No Typology Code available for payment source | Admitting: Internal Medicine

## 2021-07-18 ENCOUNTER — Telehealth: Payer: No Typology Code available for payment source | Admitting: Internal Medicine

## 2021-07-18 DIAGNOSIS — E785 Hyperlipidemia, unspecified: Secondary | ICD-10-CM

## 2021-07-18 DIAGNOSIS — Z713 Dietary counseling and surveillance: Secondary | ICD-10-CM

## 2021-07-18 DIAGNOSIS — N393 Stress incontinence (female) (male): Secondary | ICD-10-CM

## 2021-07-18 DIAGNOSIS — E538 Deficiency of other specified B group vitamins: Secondary | ICD-10-CM

## 2021-07-18 DIAGNOSIS — M222X9 Patellofemoral disorders, unspecified knee: Secondary | ICD-10-CM

## 2021-07-18 DIAGNOSIS — Z7182 Exercise counseling: Secondary | ICD-10-CM | POA: Insufficient documentation

## 2021-07-18 DIAGNOSIS — Q828 Other specified congenital malformations of skin: Secondary | ICD-10-CM

## 2021-07-18 DIAGNOSIS — M170 Bilateral primary osteoarthritis of knee: Secondary | ICD-10-CM

## 2021-07-18 DIAGNOSIS — Z7952 Long term (current) use of systemic steroids: Secondary | ICD-10-CM

## 2021-07-18 NOTE — Progress Notes (Signed)
 Meridee Score, MD  [facility]   [address]  [place], [place] [address]  Phone: [phone]  Fax: [phone]    Integrative bariatric Medicine Video Visit    Due to COVID-19 pandemic and a federally declared state of public health emergency, this service is being conducted via video/audio visit. Consent was obtained from the patient.       Date of Visit: May 09, 2021 9:54 AM    Patient Name: Anita Turner  Date of Birth: April 06, 1986    PCP: No Pcp, Per Patient     9:54 AM      The following medical visit occurred in the form of a telemedicine video visit instead of an in person face-to-face visit.  The patient and I were able to see each other via camera and communicate with each other via audio. We reviewed the benefits and limitations of telemedicine video visits. Patient answered the following questions:  1. Can you confirm that you are presently in the [place]? YES  2. Do you consent to proceed with the Telemedicine visit today? YES    We discussed the following items in detail:    SUBJECTIVE  Kemberly is a 50 year old year old female, who  has a past medical history of Anxiety, Herpes, Obesity, and UTI (urinary tract infection)., comes for medical weight loss visit prior to surgery    Has completed consider content visit in June 20 22 July August September and this is the October visit.  All the visits completed virtually come in    Severe obesity,complicated with pre diabetes chronic knee pain and possibly sleep apnea  Here for medical weight loss prior to surgery  Stopped eating middle of the night.  Stopped soda sine last visit.  BF - protein shake  Snack - fruits/ snack pack - meat and cheese  Lunch and diner - moving away from red meat. More vegetables.  Eating out - less.  Preparing- meals at home  Tracking calories on the phone  Increased water intake    EGD 7/11  Psychiatry8/25  [facility]  Has to pick up the  home sleep study- 10/25    Has appt for home sleep study      Recovering from COVID infection  Feels tired, but getting better  Still SOB, with 5 minutes of exercise  Slowly bouncing back.  Low impact yoga, using an App    Knee hurts but able to do the activity.  Using Motrin helps when swollen      OBJECTIVE  Past Medical History:   Diagnosis Date   . Anxiety    . Herpes    . Obesity    . UTI (urinary tract infection)          Past Surgical History:   Procedure Laterality Date   . KNEE SURGERY Bilateral        Social History     Socioeconomic History   . Marital status: Single   Tobacco Use   . Smoking status: Former Games developer   . Smokeless tobacco: Never Used   Substance and Sexual Activity   . Alcohol use: Not Currently     Alcohol/week: 4.0 standard drinks     Types: 2 Glasses of wine, 2 Shots of liquor per week       No family history on file.    Allergies:  Allergies   Allergen Reactions   . Morphine Hallucinations  Current Outpatient Medications   Medication Sig   . acetaminophen-codeine (TYLENOL #3) 300-30 MG tablet Take 1 tablet by mouth as needed.   Marland Kitchen ibuprofen (MOTRIN) 800 MG tablet 800 mg by Oral route.     No current facility-administered medications for this visit.       Health Maintenance:   Health Maintenance   Topic Date Due   . Cervical Cancer Screening  Never done   . PHQ9 Depression Monitoring ([place])  Never done   . Hepatitis C Screening  Never done   . Annual AUDIT Screen  Never done   . Annual DAST Screen  Never done   . HIV Screening  Never done   . COVID-19 Vaccine (4 - Booster for Pfizer series) 10/03/2020   . Influenza (1) 03/04/2021   . Tetanus (3 - Td or Tdap) 05/17/2028   . HPV Vaccine <= 26 Yrs  Completed   . Polio Vaccine  Aged Out   . Meningococcal MCV4 Vaccine  Aged Out   . Pneumococcal Vaccine  Aged Out     Immunization History   Administered Date(s) Administered   . COVID-19 [employer]) Purple Cap >= 12 Years 08/20/2019, 09/24/2019, 08/08/2020       Review of  Systems  Constitutional: No weight loss, fever, chills, weakness or fatigue.   HEENT: Eyes: No visual loss, blurred vision, doubles vision or yellow sclera. Ears, Nose, Throat: No hearing loss, sneezing, congestion, runny nose or sore throat.   Skin: No rash or itching. No lesions  Pulmonary: No shortness of breath, cough or sputum.   Cardiovascular:  No chest pain, chest pressure or chest discomfort. No palpitations or edema.   Gastrointestinal: No anorexia, nausea, vomiting or diarrhea. No abdominal pain or rectal blood.   Genitourinary: No burning on urination. No genital discharge, ulcers, or pain.  Neurological: No headache, dizziness, syncope, paralysis, ataxia, numbness or tingling in the extremities. No change in bowel or bladder control.   Musculoskeletal: No muscle, back pain, joint pain or stiffness.   Hematologic: No anemia, bleeding or bruising.   Lymphatics: No enlarged nodes. No history of splenectomy.   Psychiatric: No history of depression or anxiety. No SI/HI.  Endocrinologic: No excessive sweating, cold or heat intolerance. No polyuria or polydipsia.    Physical Exam  Vital Signs: {Vital signs were not taken during this video visit; the patient noted the following vitals:BP- 136/76   weight 305 ( lost 5 pounds in the last 4 weeks) pounds that was taken at home with their own machine  General: appears well developed, well nourished, alert and oriented, looks well  Head/Ears/Nose/Throat: normocephalic; nasal skin is normal with erythema and external nares are symmetric without occlusion or visible mucus, the patient occluded each nasal passage independently with normal airflow noted contralaterally, no perioral lesions; oropharynx clear, external ears appear normal and symmetric  Eyes: lids normal and symmetric without periorbital swelling, normal anicteric conjunctiva, normal extraocular movement  Neck: supple with full range of motion on flexion, extension, and rotation; no noticeable jugular  venous distention in a seated position  Pulmonary: no increased work of breathing without noticeable wheezing or cough, speaking in complete sentences, no cyanosis  Chest Wall: no rashes, or  skin changes, no tenderness to palpation   Abdominal: soft; nondistended; nontender as patient palpates own abdomen with my instruction  Skin: normal skin color and turgor; no rash, no localized swelling, no lesions or erythema   Musculoskeletal: Full range of motion. No localized tenderness or swelling  noted  Extremities: no pretibial edema  Psychiatric:  Affect normal. Normal mood. Normal speech. No SI/HI.  Neurological: Normal gait and coordination. Speech was normal. No facial asymmetry.    Diagnostic Data  No results found for: WBCCOUNT, RBC, HGB, HCT, MCV, MCHC, RDW, PLCTEL  No results found for: CHOL, HDL, LDLCALC, LDLDIRECT, TRIG  No results found for: A1C  No results found for: NA, K, CL, CO2, BICARB, BUN, CREAT, GFR, GLU, [place], MG, PHOS  No results found for: AST, ALT, GGT, LDH, ALK, TP, ALB, TBILI, DBILI  No results found for: TSH, FREET4  No results found for: PSA    The ASCVD Risk score (Arnett DK, et al., 2019) failed to calculate for the following reasons:    The 2019 ASCVD risk score is only valid for ages 9 to 32    ASSESSMENT and PLAN  This is a 50 year old female    Problem List Items Addressed This Visit     Prediabetes  Continue with lifestyle changes.    A1c prior to surgery.        Recurrent dislocation of knee, unspecified laterality  Has started low impact exercise and able to tolerated.    In addition encouraged patient to get 10 minutes of walking done twice a day.    Discussed medications-anti-inflammatory medicine such as Motrin Advil has to be avoided after surgery because they can cause risk for bleeding along the surgical started from the stomach which can be difficult to control.        Exercise counseling    Severe obesity (BMI >= 40) (CMS-HCC) - Primary    Nutritional counseling  Has lost 5  pounds in the last 4 weeks.    Replacing 1 meal with a protein shake.    Has eliminated soda and sugar sweetened beverages.    Congratulated patient on this you to achievement.    Please avoid soda after the surgery because this is 1 thing that can easily dilate the stomach and causes weight regain.      Continue with drinking 2 and half to 3 liters of water.    Good job preparing meals.      Continue incorporating half a plate of vegetables with each meal.      We will see you back in November to complete the next medical weight loss visit prior to surgery      Pure hypercholesterolemia  Please get fasting lab work.    Not in statin benefit group but will follow-up on lipids            All the above issues were fully discussed with patient during our video appointment and patient is aware of all of these issues as outlined above. Patient verbalized understanding and agreement with plan, and all questions and concerns were addressed to the patient's satisfaction at the time of this visit.    Future Appointments   Date Time Provider Department Center   05/09/2021 10:00 AM Meridee Score, MD [facility]   06/07/2021  9:15 AM Juluis Mire, MD [facility] [place]   07/18/2021  8:00 AM Meridee Score, MD [facility] [place]       No follow-ups on file.    Patient Instructions   Thank you very much for coming in for this virtual visit.      Your doing well.    Thank you for making significant attempt to prepare meals.    I am glad that your  able to exercise with appy.      Optimizing medical weight loss prior to surgery let us try to get at least 5 to 10 pounds off prior to surgery.    Continue replacing 1 meal with a premier protein shake.    In addition to using the to do low impact exercises please consider walking 10 to 15 minutes twice a day.      Please complete your fasting lab work at [facility].    We will see you back on November 10th on a virtual visit    I spent  40  minutes  on the day of the encounter reviewing the patient's diagnostic tests including time spent preparing for today's visit (e.g. reviewing medical records and pertinent tests); independently interpreting results; obtaining and/or reviewing the patient's history; performing an appropriate physical examination/evaluation; counseling and educating the patient and/or family/caregiver; ordering appropriate medications, tests and/or procedures; referring and/or communicating with other healthcare professionals; coordinating care; and documenting in the electronic medical record.  Patient's questions and concerns have been addressed. Verbal and written instructions was given to and reviewed with the patient. Patient endorsed verbal understanding.   Medications reviewed with patient and medication list reconciled.  Over the counter medications, herbal therapies and supplements reviewed.  Patient's understanding and response to medications assessed.      Voice Recognition Disclaimer - Due to possible errors in transcription, there may be errors in documentation: Due to voice recognition software, sound alike and misspelled words may be contained in the documentation. Portions of this chart may have been created with Judie Petit- Modal Fluency Direct Voice Recognition Software. Occasional wrong-word or sound-alike substitutions may have occurred due to the inherent limitations of voice recognition software. Please read the chart carefully and recognize, using context, where these substitutions may have occurred.       Due to COVID-19 pandemic and a federally declared state of public health emergency, this service is being conducted via telephone.  Patient consented to proceeding with telemedicine visit today. Total time spent was 21+ minutes. The telemedicine visit was conducted with Audio+Video    9:54 AM    Meridee Score, MD  has had increased appetite with hot prednisone eating more.    A gain discussed options for having more  vegetables and complex carbs.         All the above issues were fully discussed with patient during our video appointment and patient is aware of all of these issues as outlined above. Patient verbalized understanding and agreement with plan, and all questions and concerns were addressed to the patient's satisfaction at the time of this visit.    Future Appointments   Date Time Provider Department Center   07/24/2021  2:20 PM Shelly Bombard, MD [place] [facility] [place]   08/22/2021 10:00 AM Karma Greaser, MD [place] [facility]   12/20/2021 11:00 AM Rico Sheehan, MD  [id number] [place]       I spent  40  minutes on the day of the encounter reviewing the patient's diagnostic tests including time spent preparing for today's visit - reviewing medical records [facility] Provider notes reviewed; independently interpreting results; obtaining and/or reviewing the patient's history; performing an appropriate evaluation; counseling and educating the patient and/or family/caregiver; ordering appropriate medications, tests and/or procedures; referring and/or communicating with other healthcare professionals; coordinating care; and documenting in the electronic medical record.  Patient's questions and concerns have been addressed. Verbal and written instructions was given to and reviewed with the patient. Patient endorsed verbal understanding.   Medications reviewed with patient and medication list reconciled.  Over the counter medications, herbal therapies and supplements reviewed.  Patient's understanding and response to medications assessed.            Voice Recognition Disclaimer - Due to possible errors in transcription, there may be errors in documentation: Due to voice recognition software, sound alike and misspelled words may be contained in the documentation. Portions of this chart may have been created with Judie Petit- Modal Fluency Direct Voice Recognition Software. Occasional wrong-word or sound-alike substitutions may  have occurred due to the inherent limitations of voice recognition software. Please read the chart carefully and recognize, using context, where these substitutions may have occurred.       Due to COVID-19 pandemic and a federally declared state of public health emergency, this service is being conducted via telephone.  Patient consented to proceeding with telemedicine visit today. Total time spent was 21+ minutes. The telemedicine visit was conducted with Audio+Video    10:09 AM    Meridee Score, MD

## 2021-07-24 ENCOUNTER — Ambulatory Visit: Payer: No Typology Code available for payment source | Admitting: Ob/Gyn

## 2021-08-12 ENCOUNTER — Other Ambulatory Visit: Payer: Self-pay | Admitting: Internal Medicine

## 2021-08-12 MED ORDER — BUPROPION XL (DAILY) 150 MG OR TB24
150.0000 mg | ORAL_TABLET | Freq: Every morning | ORAL | 3 refills | Status: DC
Start: 2021-08-12 — End: 2022-05-20

## 2021-08-12 NOTE — Telephone Encounter (Signed)
Last refill: 08/17/2020  Quantity: 90  Refills: 3  Last visit: 05/23/2021

## 2021-08-13 ENCOUNTER — Ambulatory Visit: Payer: No Typology Code available for payment source | Admitting: Internal Medicine

## 2021-08-14 ENCOUNTER — Ambulatory Visit: Payer: No Typology Code available for payment source | Admitting: Family Practice

## 2021-08-22 ENCOUNTER — Other Ambulatory Visit
Admission: RE | Admit: 2021-08-22 | Discharge: 2021-08-22 | Disposition: A | Discharge status: Home / Self Care | Payer: No Typology Code available for payment source | Attending: Internal Medicine | Admitting: Internal Medicine

## 2021-08-22 ENCOUNTER — Ambulatory Visit: Payer: No Typology Code available for payment source | Admitting: Dermatology

## 2021-08-22 ENCOUNTER — Encounter: Payer: Self-pay | Admitting: Family Practice

## 2021-08-22 DIAGNOSIS — Z79899 Other long term (current) drug therapy: Secondary | ICD-10-CM

## 2021-08-22 DIAGNOSIS — E538 Deficiency of other specified B group vitamins: Secondary | ICD-10-CM

## 2021-08-22 DIAGNOSIS — L100 Pemphigus vulgaris: Secondary | ICD-10-CM

## 2021-08-22 DIAGNOSIS — Z6838 Body mass index (BMI) 38.0-38.9, adult: Secondary | ICD-10-CM | POA: Insufficient documentation

## 2021-08-22 DIAGNOSIS — R7989 Other specified abnormal findings of blood chemistry: Secondary | ICD-10-CM | POA: Insufficient documentation

## 2021-08-22 DIAGNOSIS — L304 Erythema intertrigo: Secondary | ICD-10-CM

## 2021-08-22 DIAGNOSIS — B009 Herpesviral infection, unspecified: Secondary | ICD-10-CM

## 2021-08-22 DIAGNOSIS — E785 Hyperlipidemia, unspecified: Secondary | ICD-10-CM | POA: Insufficient documentation

## 2021-08-22 DIAGNOSIS — Z6839 Body mass index (BMI) 39.0-39.9, adult: Secondary | ICD-10-CM

## 2021-08-22 DIAGNOSIS — Z7689 Persons encountering health services in other specified circumstances: Secondary | ICD-10-CM | POA: Insufficient documentation

## 2021-08-22 LAB — VITAMIN D, 25-OH TOTAL: Vitamin D, 25-Hydroxy, Total: 29 ng/mL — ABNORMAL LOW (ref >30.0)

## 2021-08-22 LAB — LIPID(CHOL FRACT) PANEL, BLOOD
Cholesterol: 145 MG/DL (ref <200)
HDL Cholesterol: 34 MG/DL — ABNORMAL LOW (ref >40)
LDL Cholesterol (calc): 84 MG/DL (ref <160)
Non HDL Cholesterol (calculated): 111 MG/DL (ref <130)
Triglycerides: 133 MG/DL (ref <150)
VLDL Cholesterol (calculated): 27 MG/DL

## 2021-08-22 LAB — HIV 1/2 ANTIBODY & P24 ANTIGEN ASSAY, BLOOD: HIV  1+2 Ab plus HIV 1 p24 Ant Scrn: NONREACTIVE

## 2021-08-22 LAB — GLYCOSYLATED HGB(A1C), BLOOD: Glycated Hgb, A1C: 5.9 % — ABNORMAL HIGH (ref 4.6–5.6)

## 2021-08-22 LAB — VITAMIN B12, BLOOD: Vitamin B12 Lvl: 307 PG/ML (ref 180–1241)

## 2021-08-22 MED ORDER — VALACYCLOVIR HCL 500 MG OR TABS
500.0000 mg | ORAL_TABLET | Freq: Every day | ORAL | 3 refills | Status: DC
Start: 2021-08-22 — End: 2022-09-04

## 2021-08-22 MED ORDER — MYCOPHENOLATE MOFETIL 500 MG OR TABS
1000.0000 mg | ORAL_TABLET | Freq: Two times a day (BID) | ORAL | 3 refills | Status: DC
Start: 2021-08-22 — End: 2022-09-04

## 2021-08-22 MED ORDER — NYSTATIN-TRIAMCINOLONE 100000-0.1 UNIT/GM-% EX CREA
1.0000 | TOPICAL_CREAM | Freq: Two times a day (BID) | CUTANEOUS | 3 refills | Status: DC | PRN
Start: 2021-08-22 — End: 2022-04-11

## 2021-08-22 MED ORDER — CYANOCOBALAMIN 1000 MCG OR TBCR
1000.0000 ug | EXTENDED_RELEASE_TABLET | Freq: Every day | ORAL | 3 refills | Status: DC
Start: 2021-08-22 — End: 2021-11-21

## 2021-08-22 NOTE — Progress Notes (Signed)
 Department of Dermatology  [facility]  PATIENT: Anita Turner  MRN: [id number]  DOB: [birth year]  DATE OF SERVICE: 08/22/2021    DERMATOLOGY FOLLOW-UP PATIENT VISIT     REFERRING PRACTITIONER: Karma Greaser  PRIMARY CARE PROVIDER: Laurel Dimmer

## 2021-08-22 NOTE — Patient Instructions (Signed)
Continue taking Vitamin B12 and niacinamide supplements

## 2021-10-04 ENCOUNTER — Other Ambulatory Visit
Admission: RE | Admit: 2021-10-04 | Discharge: 2021-10-04 | Disposition: A | Discharge status: Home / Self Care | Payer: No Typology Code available for payment source | Source: Ambulatory Visit | Attending: Ob/Gyn | Admitting: Ob/Gyn

## 2021-10-04 ENCOUNTER — Ambulatory Visit (INDEPENDENT_AMBULATORY_CARE_PROVIDER_SITE_OTHER): Payer: No Typology Code available for payment source | Admitting: Ob/Gyn

## 2021-10-04 ENCOUNTER — Encounter: Payer: Self-pay | Admitting: Ob/Gyn

## 2021-10-04 VITALS — BP 136/66 | HR 89 | Temp 98.4°F | Ht 60.0 in | Wt 203.0 lb

## 2021-10-04 DIAGNOSIS — Z01419 Encounter for gynecological examination (general) (routine) without abnormal findings: Secondary | ICD-10-CM

## 2021-10-04 NOTE — Progress Notes (Signed)
 [id number]   Anita Turner   [facility]  10/04/2021   Gender: female  Current Location: [facility]  DOB: 1971/03/14      OB/GYN      Amb Follow Up Note, Gynecology   Clinician Documentation:    Alberteen [facility], M.D,  [facility]   OB/GYN    Chief Complai

## 2021-10-07 LAB — GENITAL CULTURE

## 2021-10-08 ENCOUNTER — Ambulatory Visit: Payer: No Typology Code available for payment source | Admitting: Retina Specialist

## 2021-10-08 LAB — GENITAL CULTURE: Gram Stain: NONE SEEN

## 2021-10-10 LAB — CYTOLOGY GYN - ~~LOC~~

## 2021-10-13 LAB — HPV, HIGH RISK GROUP WITH 16 AND 18 GENOTYPE BY PCR
HPV Type 16: NOT DETECTED
HPV Type 18: NOT DETECTED
Other Types HPV HR: NOT DETECTED

## 2021-11-15 ENCOUNTER — Encounter: Payer: No Typology Code available for payment source | Admitting: Naturopath

## 2021-11-20 NOTE — Progress Notes (Unsigned)
Anita Cloud MD  [facility]  Date: Thursday November 21, 2021    Establish Care    Chief Concern:   Chief Complaint   Patient presents with   . Other     Establish care     History of Present Illness:  Patient is a 51 year old female who presents to establish care.    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 On cellcept. Stopped prednisone 07/2021. Previously on rituxan and IVIG   Obesity - Body mass index is 39.29 kg/m. Active 2x/day w/ yoga for >6 months. Eating healthy.    PreDM - A1c 5.9* (01/19)   Kidney stones  - last kidney stone in 2004.    Fatty liver - seen on imaging 11/2020   History of COVID-19 - follows w/ COVID-19 recovery clinic. Prescribed pepcid, tessalon, NAC but not taking.   Stress incontinence  - referred to PT but doing yoga instead,   B12 deficiency - was taking 1,021m daily but stopped. Last level 08/2021 low 300s.     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    Anxiety/MDD - on wellbutrin XL '150mg'$  daily and klonopin 0.'5mg'$  PRN. Klonopin made her too sleep long term and prefers xanax PRN.   LSIL - pap 10/2021 w/ LSIL negative HPV. Repeat 10/2022 w/ OBGYN.    Abdominal cramping - present since cholecystectomy. Has been prescribed bile X. Open to trying antispasmodic. Open to colonoscopy as due.   Health Care Maintenance   Screening  . Lung [place] screening - n/a   . Colon [place] screening - due   . Cervical [place] Screening - LSIL negative HPV 10/04/2021. Repeat 1 year.   . Breast [place] Screening- 06/2021 BI-RADS 1   . Tobacco Use Screening - non-smoker   . Alcohol Use Screening - negative  . DM screening -A1c 5.9* (01/19)  . Drug Use Screening -  negative  . Depression Screening - negative  . Obesity screening - Body mass index is 39.29 kg/m.  .Marland KitchenGC/CT screening - n/a   . HIV screening - 08/12/2021 NR     . Hepatitis C screening -due    . Intimate Partner Violence screening - negative    . Latent TB screening -  Due   . Folate acid supplementation - n/a   . Osteoporosis screening: n/a  Vaccinations:  . Influenza vaccine: n/a    . TDaP - 2022    . Shingles- n/a   . Pneumococcal vaccine: PPSV-23 03/2021.   .Marland KitchenHepatitis A: n/a    . Hepatitis B: n/a    . HPV vaccine: n/a   . Meningococcal: n/a      I have fully reviewed the past medical, surgical, social and family history and updated the Histories section of EpicCare.    Past Medical History:   Patient Active Problem List   Diagnosis   . Pemphigus vulgaris   . Pemphigus foliaceus   . Abnormal EKG   . Class 2 severe obesity due to excess calories with serious comorbidity and body mass index (BMI) of 38.0 to 38.9 in adult (CMS-HCC)   . Osteoarthritis of knee   . Prediabetes   . Nephrolithiasis   . Vitreomacular adhesion of both eyes   . Age-related nuclear cataract of both eyes   . Dyslipidemia   .  Fatty liver   . Personal history of COVID-19-July 2022   . B12 deficiency   . Female stress incontinence   . Dry mouth   . 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 Other specified congenital anomaly of skin   . Current use of steroid medication   . Recurrent HSV (herpes simplex virus)   . Papanicolaou smear of cervix with low grade squamous intraepithelial lesion (LGSIL)   . Anxiety and depression     Past Surgical History:   Past Surgical History:   Procedure Laterality Date   . CHOLECYSTECTOMY  2017   . APPENDECTOMY  2002     OB History  OB History   Gravida Para Term Preterm AB Living   '2 1 1 '$ 0 1 1   SAB IAB Ectopic Multiple Live Births   0 1 0 0 0     Family History:   Family History   Problem Relation Name Age of Onset   . Diabetes Mother          HTN, DJD   . Neurological Disease (Parkinsons/Huntingtons/Huntingtons Chorea) Father          DM, alcoholism   . Diabetes Father     . No Known Problems Sister     . Thyroid Brother     . Cataract Maternal Grandfather     .  Cataract Paternal Grandfather       Allergies:   No Known Allergies    Medications:    Current Outpatient Medications:   .  ALPRAZolam (XANAX) 0.5 MG tablet, Take 1 tablet (0.5 mg) by mouth nightly as needed for Insomnia., Disp: 15 tablet, Rfl: 0  .  buPROPion (WELLBUTRIN XL) 150 MG XL tablet, Take 1 tablet (150 mg) by mouth every morning., Disp: 90 tablet, Rfl: 3  .  hyoscyamine (ANASPAZ) 0.125 MG tablet, Take 1 tablet (0.125 mg) by mouth every 4 hours as needed for Cramping., Disp: 120 tablet, Rfl: 0  .  Multiple Vitamin (MULTIVITAMIN) capsule, Take 1 capsule by mouth daily., Disp: 30 tablet, Rfl: 0  .  mycoPHENOLate mofetil (CELLCEPT) 500 MG tablet, Take 2 tablets (1,000 mg) by mouth 2 times daily., Disp: 360 tablet, Rfl: 3  .  nystatin-triamcinolone (MYCOGEN II) 496759-1.6 UNIT/GM-% cream, Apply 1 Application topically 2 times daily as needed (intertrigo in armpit). For rash under arms, Disp: 60 g, Rfl: 3  .  valACYclovir (VALTREX) 500 MG tablet, Take 1 tablet (500 mg) by mouth daily., Disp: 90 tablet, Rfl: 3    Social History:   Social History     Socioeconomic History   . Marital status: Married     Spouse name: Not on file   . Number of children: Not on file   . Years of education: Not on file   . Highest education level: Not on file   Occupational History   . Not on file   Tobacco Use   . Smoking status: Never   . Smokeless tobacco: Never   Vaping Use   . Vaping status: Never Used     Passive vaping exposure: Yes   Substance and Sexual Activity   . Alcohol use: Not Currently   . Drug use: Never   . Sexual activity: Yes     Partners: Male   Other Topics Concern   . Not on file   Social History Narrative    Married, one teenager daughter, no pets, homemaker, yoga, walks for exercise when her knee does not hurt  Social Determinants of Health     Financial Resource Strain: Not on file   Food Insecurity: Not on file   Transportation Needs: Not on file   Physical Activity: Not on file   Stress: Not on file    Social Connections: Not on file   Intimate Partner Violence: Not on file   Housing Stability: Not on file     Review of Systems (ROS):   Per HPI    Physical Exam  Vital signs:    11/21/21  1108   BP: 115/81   Pulse: 79   Temp: 98.2 F (36.8 C)   SpO2: 98%    Body mass index is 39.29 kg/m.  General Appearance: The patient is well-developed, well-nourished, and in no acute distress at the time of the exam. She is able to sit comfortably on the examination table without difficulty.  Skin: Gross inspection of skin demonstrates no evidence of abnormality. Hair and nails are normal. Skin is warm and dry.   Head: Patient's head is normocephalic, without any gross bulging or retraction noted.   Eyes: Pupils are equal, round, reactive to light and accomodation. Extraocular muscles are intact. Eyelids appear normal with no signs of ptosis, lidlag, or lid edema.   ENT: Both ears appear grossly normal, canal are clear, tympanic membranes are intact without abnormality, Nasal mucosa is clear, no lesions or discharge. Throat, oral cavity and tongue normal.   Neck:  Neck supple.  Respiratory: Lungs are clear to auscultation bilaterally. No wheezing, rales, rubs, or rhonchi noted. Good inspiratory effort noted and thorax rises symmetrically.   Cardiovascular:  Normal sinus rhythm with a normal S1 and S2 without audible click, murmur or rub. No S3 auscultated.  Abdomen:  The abdomen is soft, non-tender without rebound, gaurding, or ridgidity. Normal bowel sounds are appreciated in all quadrants, no bruits heard. No palpable masses or hepatosplenomegaly.  Extremities:  Extremities, and peripheral pulses are normal. No edema.   Neuro: Grossly normal    Assessment & Plan:  Patient is a 51 year old female who presents to establish care.     1. Encounter to establish care - extensive chart review performed and updated     2. Pemphigus vulgaris - follows w/ dermatology. On cellcept. Previously on prednisone, rituxan and IVIG. Continue  to f/u w/ dermatology.     3. Pemphigus foliaceus -  follows w/ dermatology. On cellcept. Previously on prednisone, rituxan and IVIG. Continue to f/u w/ dermatology.     4. Class 2 severe obesity due to excess calories with serious comorbidity and body mass index (BMI) of 38.0 to 38.9 in adult (CMS-HCC) - c/b fatty liver. Diet and exercise as tolerated     5. Prediabetes - A1c 5.9* (01/19) Diet and exercise as tolerated     6. Nephrolithiasis - PO fluids to prevent recurrence     7. Fatty liver - seen on imaging 4/202. Diet and exercise.   - Comprehensive Metabolic Panel; Future  - Glycosylated Hgb(A1C), Blood Lavender; Future  - Thyroid Cascade Green Plasma Separator Tube; Future    8. 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    9. B12 deficiency - takes 1,063mg of B12 by mouth daily. Continue   - Vitamin B12, Blood Green Plasma Separator Tube; Future    10. Female stress incontinence - referred to PT for pelvic floor exercises. Doing yoga.     11. Recurrent HSV (herpes simplex virus) - on valtrex '500mg'$  daily for  prophylaxis. Continue     12. Dyslipidemia - Cholesterol 145 (01/19) HDL 34* (01/19) LDL 84 (01/19) Triglycerides 133 (01/19) Diet and exercise as tolerated     13. Anxiety and depression - stable on wellbutrin XL '150mg'$  daily and klonopin 0.'5mg'$  PRN. Transition from klonopin to xanax. CURES reviewed.     14.  Papanicolaou smear of cervix with low grade squamous intraepithelial lesion (LGSIL) - on Pap 10/2021. Negative HPV. Repeat 10/2022    15. Screening for colon cancer - due  - GI Endoscopy Procedure Service Request - Internal    16. Vitamin D deficiency - low to 89. Will recheck.   - Vitamin D, 25-OH Total Yellow serum separator tube; Future    17. Screening examination for pulmonary tuberculosis - given on cellcept.   - QuantiFERON-TB Gold Plus,4-Tube Quantiferon Tube Set; Future    18. Abdominal cramping - suspicious for IBS given anxiety vs. Bile malabsorption. Will  refer for colonoscopy. Rx trial for Anaspaz. S/p cholecystectomy and appendectomy. Doubt colitis.   - hyoscyamine (ANASPAZ) 0.125 MG tablet; Take 1 tablet (0.125 mg) by mouth every 4 hours as needed for Cramping.  Dispense: 120 tablet; Refill: 0    Follow up: Return to clinic 3-6 month     Venetia Constable, MD    Billed [phone] as I spent more than 40 min in total encounter time including review of relevant notes, labs/imaging, counseling and coordinating of care for all listed diagnoses, as well as engaging in all medical decision making issues related to the encounter.

## 2021-11-21 ENCOUNTER — Other Ambulatory Visit
Admission: RE | Admit: 2021-11-21 | Discharge: 2021-11-21 | Disposition: A | Discharge status: Home / Self Care | Payer: No Typology Code available for payment source | Attending: Dermatology | Admitting: Dermatology

## 2021-11-21 ENCOUNTER — Encounter: Payer: Self-pay | Admitting: Family Practice

## 2021-11-21 ENCOUNTER — Ambulatory Visit: Payer: No Typology Code available for payment source | Admitting: Family Practice

## 2021-11-21 ENCOUNTER — Ambulatory Visit: Payer: No Typology Code available for payment source | Admitting: Dermatology

## 2021-11-21 VITALS — BP 115/81 | HR 79 | Temp 98.2°F | Ht 60.0 in | Wt 201.2 lb

## 2021-11-21 DIAGNOSIS — B009 Herpesviral infection, unspecified: Secondary | ICD-10-CM

## 2021-11-21 DIAGNOSIS — Z79899 Other long term (current) drug therapy: Secondary | ICD-10-CM | POA: Insufficient documentation

## 2021-11-21 DIAGNOSIS — Z6839 Body mass index (BMI) 39.0-39.9, adult: Secondary | ICD-10-CM | POA: Insufficient documentation

## 2021-11-21 DIAGNOSIS — R7303 Prediabetes: Secondary | ICD-10-CM

## 2021-11-21 DIAGNOSIS — E538 Deficiency of other specified B group vitamins: Secondary | ICD-10-CM | POA: Insufficient documentation

## 2021-11-21 DIAGNOSIS — F419 Anxiety disorder, unspecified: Secondary | ICD-10-CM

## 2021-11-21 DIAGNOSIS — N200 Calculus of kidney: Secondary | ICD-10-CM

## 2021-11-21 DIAGNOSIS — F32A Depression, unspecified: Secondary | ICD-10-CM | POA: Insufficient documentation

## 2021-11-21 DIAGNOSIS — U099 Post covid-19 condition, unspecified: Secondary | ICD-10-CM

## 2021-11-21 DIAGNOSIS — L102 Pemphigus foliaceous: Secondary | ICD-10-CM

## 2021-11-21 DIAGNOSIS — R87612 Low grade squamous intraepithelial lesion on cytologic smear of cervix (LGSIL): Secondary | ICD-10-CM

## 2021-11-21 DIAGNOSIS — L100 Pemphigus vulgaris: Secondary | ICD-10-CM

## 2021-11-21 DIAGNOSIS — Z111 Encounter for screening for respiratory tuberculosis: Secondary | ICD-10-CM

## 2021-11-21 DIAGNOSIS — Z7689 Persons encountering health services in other specified circumstances: Secondary | ICD-10-CM

## 2021-11-21 DIAGNOSIS — K760 Fatty (change of) liver, not elsewhere classified: Secondary | ICD-10-CM | POA: Insufficient documentation

## 2021-11-21 DIAGNOSIS — E559 Vitamin D deficiency, unspecified: Secondary | ICD-10-CM

## 2021-11-21 DIAGNOSIS — R109 Unspecified abdominal pain: Secondary | ICD-10-CM

## 2021-11-21 DIAGNOSIS — E785 Hyperlipidemia, unspecified: Secondary | ICD-10-CM

## 2021-11-21 DIAGNOSIS — N393 Stress incontinence (female) (male): Secondary | ICD-10-CM

## 2021-11-21 DIAGNOSIS — Z1211 Encounter for screening for malignant neoplasm of colon: Secondary | ICD-10-CM

## 2021-11-21 LAB — COMPREHENSIVE METABOLIC PANEL, BLOOD
ALT: 15 U/L (ref 7–52)
AST: 23 U/L (ref 13–39)
Albumin: 4.2 G/DL (ref 3.7–5.3)
Alk Phos: 100 U/L (ref 34–104)
BUN: 7 mg/dL (ref 7–25)
Bilirubin, Total: 0.3 mg/dL (ref 0.0–1.4)
CO2: 27 mmol/L (ref 21–31)
Calcium: 9.1 mg/dL (ref 8.6–10.3)
Chloride: 106 mmol/L (ref 98–107)
Creat: 0.7 mg/dL (ref 0.6–1.2)
Electrolyte Balance: 8 mmol/L (ref 2–12)
Glucose: 79 mg/dL (ref 70–115)
Potassium: 4.5 mmol/L (ref 3.5–5.1)
Protein, Total: 6.7 G/DL (ref 6.0–8.3)
Sodium: 141 mmol/L (ref 136–145)
eGFR - high estimate: >60 (ref >59)
eGFR - low estimate: >60 (ref >59)

## 2021-11-21 LAB — CBC WITH DIFF, BLOOD
Basophils %: 1 %
Basophils Absolute: 0.1 10*3/uL (ref 0.0–0.2)
Eosinophils %: 4.8 %
Eosinophils Absolute: 0.5 10*3/uL (ref 0.0–0.5)
Hematocrit: 40.9 % (ref 34.0–44.0)
Hgb: 14.1 G/DL (ref 11.5–15.0)
Lymphocytes %.: 20.4 %
Lymphocytes Absolute: 2.1 10*3/uL (ref 0.9–3.3)
MCH: 29.7 PG (ref 27.0–33.5)
MCHC: 34.3 G/DL (ref 32.0–35.5)
MCV: 86.4 FL (ref 81.5–97.0)
MPV: 8.6 FL (ref 7.2–11.7)
Monocytes %: 3.9 %
Monocytes Absolute: 0.4 10*3/uL (ref 0.0–0.8)
PLT Count: 374 10*3/uL (ref 150–400)
Platelet Morphology: NORMAL
RBC Morphology: NORMAL
RBC: 4.74 10*6/uL (ref 3.70–5.00)
RDW-CV: 13.8 % (ref 11.6–14.4)
Seg Neutro % (M): 69.9 %
Seg Neutro Abs (M): 7.2 10*3/uL (ref 2.0–8.1)
White Bld Cell Count: 10.3 10*3/uL (ref 4.0–10.5)

## 2021-11-21 LAB — VITAMIN D, 25-OH TOTAL: Vitamin D, 25-Hydroxy, Total: 37.1 ng/mL (ref >30.0)

## 2021-11-21 LAB — THYROID CASCADE: TSH, Ultrasensitive: 2.247 u[IU]/mL (ref 0.450–4.120)

## 2021-11-21 LAB — VITAMIN B12, BLOOD: Vitamin B12 Lvl: 310 PG/ML (ref 180–1241)

## 2021-11-21 MED ORDER — HYOSCYAMINE SULFATE 0.125 MG OR TABS
125.0000 ug | ORAL_TABLET | ORAL | 0 refills | Status: DC | PRN
Start: 2021-11-21 — End: 2022-04-11

## 2021-11-21 MED ORDER — ALPRAZOLAM 0.5 MG OR TABS
0.5000 mg | ORAL_TABLET | Freq: Every evening | ORAL | 0 refills | Status: DC | PRN
Start: 2021-11-21 — End: 2022-02-17

## 2021-11-21 NOTE — Interdisciplinary (Signed)
Patient here to establish care  

## 2021-11-21 NOTE — Progress Notes (Signed)
Department of Dermatology  [place]  PATIENT: Anita Turner  MRN: [id number]  DOB: May 16, 1971  DATE OF SERVICE: 11/21/2021    DERMATOLOGY FOLLOW-UP PATIENT VISIT     REFERRING PRACTITIONER: Crosby Oyster  PRIMARY CARE PROVIDER: Venetia Constable  CHIEF COMPLAINT:   Chief Complaint   Patient presents with   . Follow Up   pemphigus     Subjective:     Ilaria Much is a 51 year old very pleasant female who was seen in clinic for f/u pemphigus vulgaris and pemphigus foliaceous. She is taking cellcept 1000 mg BID with no issues.     Saw Dr. Margaretha Glassing this morning.     Axillary rash resolved with change in deodorant and is no longer an issue.     No new lesions or concerns.   No side effects with cellcept.   Due for labs.    No recent illness.  Has not had to take prednisone.   No flares. No oral symptoms. No lesions.     Objective:   General: Pleasant, in no acute distress, appears stated age  There were no vitals filed for this visit.    Skin Exam:  Skin Type: IV  Pertinent skin findings below:  Skin exam performed including face and oral mucosa    No oral or skin lesions today.    02/07/2019   Basement Membrane Zone (BMZ) IgG, IgG4, and IgA  IgG: Negative, monkey esophagus substrate     Negative, human split skin substrate      IgG4: Negative, monkey esophagus substrate        Negative, human split skin substrate   IgA: Negative, monkey esophagus substrate     Negative, human split skin substrate   Positive IgG, including IgG4, cell surface antibodies,         monkey esophagus substrate     Enzyme Linked Immunosorbent Assay (ELISA)   --------------------------------------------------   Bullous Pemphigoid (BP) 180 and 230 IgG Antibodies   IgG BP 180 antibodies: 1 unit  NEGATIVE  IgG BP 230 antibodies: 1 unit  NEGATIVE    02/07/2019  Cell Surface IgG Antibodies   IgG: Positive, titer 1:2560 (H), monkey esophagus substrate     Positive, titer 1:640 (H), intact human skin substrate      Enzyme-Linked Immunosorbent Assay (ELISA)  -----------------------------------------  Desmoglein (DSG) 1 and 3 IgG Antibodies   IgG desmoglein 1 antibodies:  7 units  NEGATIVE    Reference Range:      Positive (H) = Greater than 20 units      Borderline/Indeterminate = 14-20 units      Negative = Less than 14 units   IgG desmoglein 3 antibodies: 320 units (H)  POSITIVE     (Initial level, 139 units, greater than high         calibrator; diluted to achieve 64 units, within      assay calibrators, and multiplied by the dilution      factor of 5)   COMMENTS   Specific   -------------------------------------------------   These indirect immunofluorescence results, demonstrating   positive IgG cell surface antibody reactivity on both   monkey esophagus and intact human skin substrates, support   the diagnosis of pemphigus.    Assessment/Plan     # Pemphigus vulgaris  # Medication monitoring  Xerostomia resolved with resumption of cellcept.  Currently well-controlled with no flares since last visit.   Not on prednisone.  Patient previously followed by Dr. Charm Barges  and prior labwork including serologies for pemphigus/pemphigoid, CBC, CMP. She was previously on rituxan. Was also on IVIg monthly but stopped in November 2021 due to difficulty tolerating infusions.     - Continue cellcept 1000 mg bid  - As well-controlled at this time, will hold off on adding additional therapies  - Have labs drawn today: CBC and CMP  - Continue B12 replacement   - Continue NCN 500 mg tid     # Recurrent HSV, >6 episodes per year  - Continue valtrex 500 mg daily as suppression     The above plan of care, diagnosis, orders, and follow-up were discussed with the patient. Risks, benefits, alternatives to therapy were discussed. Questions related to this recommended plan of care were answered.    RTC in 3-4 months    Marcelle Overlie, MD

## 2021-11-21 NOTE — Patient Instructions (Signed)
Xanax instead of klonopin  Colonoscopy referral  Anaspaz every 4 hours as needed for cramping.   Blood work

## 2021-11-22 ENCOUNTER — Encounter: Payer: Self-pay | Admitting: Dermatology

## 2021-11-22 LAB — QUANTIFERON-TB GOLD PLUS, 4-TUBE
QuantiFERON  NIL: 0.13 IU/mL
QuantiFERON MITOGEN minus NIL: 9.87 IU/mL
QuantiFERON TB1 minus NIL: 0 IU/mL
QuantiFERON TB2 minus NIL: 0 IU/mL
QuantiFERON-TB Gold Plus, Result: NEGATIVE

## 2021-11-22 LAB — GLYCOSYLATED HGB(A1C), BLOOD: Glycated Hgb, A1C: 5.3 % (ref 4.6–5.6)

## 2021-12-02 ENCOUNTER — Ambulatory Visit: Payer: No Typology Code available for payment source | Admitting: Internal Medicine

## 2021-12-06 ENCOUNTER — Telehealth: Payer: Self-pay | Admitting: Ob/Gyn

## 2021-12-06 NOTE — Telephone Encounter (Signed)
Called pt to assist with scheduling an appt., per pt agreed to schedule appt Monday May 8 at 4:20

## 2021-12-09 ENCOUNTER — Ambulatory Visit (INDEPENDENT_AMBULATORY_CARE_PROVIDER_SITE_OTHER): Payer: No Typology Code available for payment source | Admitting: Ob/Gyn

## 2021-12-09 ENCOUNTER — Encounter: Payer: Self-pay | Admitting: Ob/Gyn

## 2021-12-09 VITALS — BP 119/70 | HR 85 | Ht 60.0 in | Wt 202.0 lb

## 2021-12-09 DIAGNOSIS — N939 Abnormal uterine and vaginal bleeding, unspecified: Secondary | ICD-10-CM

## 2021-12-09 NOTE — Patient Instructions (Signed)
-  Radiology: a radiology order has been placed for you. Please review the following instructions:     -XRAYS: For xrays, please proceed to bldg. 300 (DIAGNOSTICS). NO appointment is needed. OR you can also have these imaging tests done at any [facility] radiology department.    -MRI, CT, BONE DENSITY: Please give 7-10 business days for us to obtain authorization, then you can contact the main radiology scheduling department at [phone].    - You can schedule for any location by calling the main radiology scheduling department at [phone].        [place] RADIOLOG[facility] LOCATIONS    [facility]  [address]  [place], [place] [address]  Phone: [phone]  Hours: Monday through Friday, 8 a.m. to 5 p.m. and Saturday through Sunday, 7:30 a.m. to 4:30 p.m.   Services  Weekend services:  Computed tomography (CT scan)  Magnetic resonance imaging (MRI)  Other services:  Bone density scans (DXA scans)   Breast ultrasound   Computed tomography (CT scan)   Interventional radiology   Magnetic resonance imaging (MRI)   Nuclear/molecular medicine (NM)   Positron emission tomography - computed tomography (PET CT)   3D mammography   Ultrasound   X-ray exams    [facility]  [address]  [place], [place] [address]  Phone: [phone]  Hours: Monday through Friday, 8 a.m. to 5 p.m.  Services  Bone density scans (DXA scans)   Digital screening mammography   X-ray exams    [facility]  [address]  [place], [place] [address]  Phone: [phone]  Hours: Monday through Friday, 8 a.m. to 5 p.m.  Services  Ultrasound and ultrasound-guided procedures   MRI and MRI-guided procedures   Tomosynthesis (3D mammography) and image-guided procedures    [facility] [place]  [address]  [place], [place] [address]  Phone: [phone]  Hours: Monday through Friday, 8 a.m. to 5 p.m.  Services  Computed tomography (CT scan)   Magnetic resonance imaging (MRI)    [facility] [place]  [address] [place] [address]  [place], [place] [address]  Phone: [phone]  Hours: Monday through Friday, 8 a.m. to 5 p.m.  Services   Ultrasound and ultrasound-guided procedures   MRI and MRI-guided procedures   Tomosynthesis (3D mammography) and image-guided procedures   X-ray exams    [facility] [place]  [address]  [place], [place] [address]  Phone: [phone]  Hours: Monday through Friday, 8 a.m. to 9 p.m. and Saturday through Sunday, 9 a.m. to 5 p.m.   Services  Ultrasound   X-ray exams        [email or web address]       We want to make sure that your visit with was a pleasant one. Tell us how we're doing. If you receive a survey, we would greatly appreciate it if you could take a minute to fill it out.    If you have any questions, my name is Mary  (MA) and you can reach me at [phone]. Thank you. =)

## 2021-12-09 NOTE — Progress Notes (Addendum)
[id number]   [place]  12/09/2021   Gender: female  Current Location: [facility]  DOB: 1971-01-04      OB/GYN      Amb Follow Up Note, Gynecology   Clinician Documentation:    Anita Turner, M.D,  [facility]   OB/GYN    Chief Complaint: Abdominal pain    History of Present Illness:  This is a 51 year old female, G2P1011  LMP: Patient's last menstrual period was 11/11/2021.     Last pap 10/04/21 LGSIL.  Had Liletta IUD inserted in 2019.  Periods are regular but recently since March having intermenstrual bleeding.   Endorses cramping and back pain for the last 2 months.  Managed with ibuprofen which provides relief.  Has occasional hot flashes.  Previously on oral estrogen, took for 2 weeks for vasomotor symptoms, but then stopped, has not taken for 1 year. Hot flashes are infrequent and not bothersome    She denies any bloating, early satiety, changes in bowel/bladder function or reflux symptoms. . Also denies pelvic pain, dysuria and hematuria. She further denies any fevers, chills, chest pain, SOB, nausea/vomiting, diarrhea, constipation, dysuria, or calf tenderness. No difficulty urinating or defecating.       No FHx of cancers.      Medications:  Current Outpatient Medications:   .  ALPRAZolam (XANAX) 0.5 MG tablet, Take 1 tablet (0.5 mg) by mouth nightly as needed for Insomnia., Disp: 15 tablet, Rfl: 0  .  buPROPion (WELLBUTRIN XL) 150 MG XL tablet, Take 1 tablet (150 mg) by mouth every morning., Disp: 90 tablet, Rfl: 3  .  hyoscyamine (ANASPAZ) 0.125 MG tablet, Take 1 tablet (0.125 mg) by mouth every 4 hours as needed for Cramping., Disp: 120 tablet, Rfl: 0  .  Multiple Vitamin (MULTIVITAMIN) capsule, Take 1 capsule by mouth daily., Disp: 30 tablet, Rfl: 0  .  mycoPHENOLate mofetil (CELLCEPT) 500 MG tablet, Take 2 tablets (1,000 mg) by mouth 2 times daily., Disp: 360 tablet, Rfl: 3  .  nystatin-triamcinolone (MYCOGEN II) 443154-0.0 UNIT/GM-% cream, Apply 1 Application topically 2 times  daily as needed (intertrigo in armpit). For rash under arms, Disp: 60 g, Rfl: 3  .  valACYclovir (VALTREX) 500 MG tablet, Take 1 tablet (500 mg) by mouth daily., Disp: 90 tablet, Rfl: 3    Allergies:  No Known Allergies    Review of Systems:  . Constitutional:  Denies fever, chills, weight loss, weight gain  . Head & Neck:  Denies headache, vision change, hearing change, sore throat  . Respiratory:  Denies shortness of breath, cough  . Cardiovascular:  Denies chest pain/pressure, orthopnea, PND, palpitations  . Gastrointestinal:  Denies abdominal pain, nausea, vomiting, diarrhea, or rectal bleeding  . Genitourinary:  Denies dysuria, polyuria, hesitancy, frequency  . Musculoskeletal:  Denies joint pain, leg edema  . Neurological:  Denies  focal numbness, weakness  . All other Review of Systems is negative   .   Physical Exam:   Vital Signs:  BP 119/70 (BP Location: Left arm, BP Patient Position: Sitting)   Pulse 85   Ht 5' (1.524 m)   Wt 91.6 kg (202 lb)   LMP 11/11/2021   BMI 39.45 kg/m     General:  female, in nad   GU/Pelvic:    BUS: no lesions, masses or tenderness   CX/VAG: no discharge, lesions or odor +IUD string in place, scant blood   Biman: normal uterus size and contour, no cmt.  No adnexal masses or tenderness    Extremities:   no edema   Neurological:  alert and oriented x 3, motor 5/5, sensory intact   Skin:  no suspicious lesions     Diagnostic Data:    Assessment/Plan  Anita Turner is a 51 year old  presenting today for :     ICD-10-CM ICD-9-CM   1. Abnormal uterine bleeding (AUB)  N93.9 626.9   no hemorrhage, normal vital signs  reviewed possible etiology, reviewed pap and labs, no concerns for thyroid d/o  Structural, hormonal with liletta vs perimenopausal vs hyperplasia cancer    Plan    # Order for US Transvaginal to confirm IUD location and assess for possible structural causes    # Follow up for EMB to assess for hyperplasia/cancer, order placed    Anita Turner, M.D.   Ob/Gyn  Attending      Scribe Attestation  The notes I am recording reflect only actions made by and judgments taken by this provider, Dr. Philmore Pali, for whom I am scribing today.  I have performed no independent clinical work.    Leanne Chang    ____________________________________________________________________    Provider Attestation for Scribed Note    As the attending provider, I agree with the scribed content.  Any changes or edits are noted in the text above.     Jaquita Folds

## 2021-12-12 ENCOUNTER — Encounter: Payer: Commercial Managed Care - PPO | Admitting: Naturopath

## 2021-12-20 ENCOUNTER — Other Ambulatory Visit: Payer: Self-pay

## 2021-12-20 ENCOUNTER — Encounter: Payer: Self-pay | Admitting: Ob/Gyn

## 2021-12-20 ENCOUNTER — Telehealth: Payer: Self-pay | Admitting: Ob/Gyn

## 2021-12-20 ENCOUNTER — Ambulatory Visit: Payer: No Typology Code available for payment source | Admitting: Rheumatology

## 2021-12-20 MED ORDER — PEG 3350-KCL-NA BICARB-NACL 420 GM OR SOLR
4000.0000 mL | Freq: Once | ORAL | 0 refills | Status: AC
Start: 2021-12-20 — End: 2021-12-20

## 2021-12-20 NOTE — Telephone Encounter (Signed)
From: Azzie Glatter  To: Jaquita Folds, MD  Sent: 12/20/2021 3:57 PM PDT  Subject: June 7 th appointment     Good evening Doctor want to check if i get another day instead of June 7 th I have colonoscopy scheduled in for that day, I called the office and was told you are booked till august checking if you can add me to your list if they are any cancellations. Thank you.  [place]

## 2021-12-20 NOTE — Telephone Encounter (Signed)
12/20/21 348pm    Please enter and submit bowel prep to patient's pharmacy on file. [facility]     [id number]  - [address] AT [address]   Appointment: 01/08/2022

## 2021-12-20 NOTE — Telephone Encounter (Signed)
Patient had called needing to reschedule her 01/08/22 biopsy.  Patient stated that she is also scheduled for a colonoscopy the same day.  Attempted to assist patient but Dr. Philmore Pali is currently booked out till August.  Patient did not wish to wait that long due to her U/S being done 12/26/21.  Patient requested a call back from clinic to reschedule please advise

## 2021-12-20 NOTE — Telephone Encounter (Signed)
Trilyte in QUEUE for approval.

## 2021-12-23 NOTE — Telephone Encounter (Signed)
12/23/21 patient also sent mychart message. Offered an appointment through [employer]. AM

## 2021-12-24 ENCOUNTER — Ambulatory Visit: Payer: No Typology Code available for payment source | Admitting: Registered Nurse

## 2021-12-24 NOTE — Anesthesia Preprocedure Evaluation (Addendum)
ANESTHESIA PRE-OPERATIVE EVALUATION    Patient Information    Name: Anita Turner    MRN: [id number]    DOB: Dec 25, 1970    Age: 51 year old    Sex: female  Procedure(s):  COLONOSCOPY, WITH ANESTHESIA      Pre-op Vitals:   BP 145/61   Pulse 83   Temp 36.2 C   Resp 18   Ht 5' (1.524 m)   Wt 90.3 kg (199 lb)   LMP 01/02/2022 (Exact Date)   SpO2 99%   BMI 38.86 kg/m         Primary language spoken:  English    ROS/Medical History:       History of Present Illness: 51 year old female s/f colo        General:  positive for Obesity,  Functional Capacity: Can do light activities ([facility] <4) AQ 12/25/2021  Can safely lie flat on back for an hour       Social History:  Denies smoking  Denies ETOH  Denies illicit drug use   Cardiovascular:  negative cardio ROS     Anesthesia History:  Past Surgical History:  2017: CHOLECYSTECTOMY  2002: APPENDECTOMY Pulmonary:   negative pulmonary ROS     Neuro/Psych:   psychiatric history,   Hematology/Oncology:   hematologic/lymphatic negative      GI/Hepatic:  negative GI/hepatic ROS Infectious Disease:  negative for infectious disease     Renal:  negative renal ROS   Endocrine/Other:  negative endo/other ROS     Pregnancy History:   Pediatrics:         Pre Anesthesia Testing (PCC/CPC) notes/comments:    [facility] Test & records reviewed by [facility] Provider.                      Data done with information provided in AQ and H&P 12/24/2021    Ready. Wayne Sever, NP 12/31/2021 11:31 AM                 Physical Exam    Airway:  Inter-inciser distance > 4 cm    Mallampati: II  Neck ROM: full  TM distance: 5-6 cm          Cardiovascular:  - cardiovascular exam normal         Pulmonary:  - pulmonary exam normal           Neuro/Neck/Skeletal/Skin:      Dental:  - normal exam    Abdominal:      General: obesity     Additional Clinical Notes:                                                                                                        Past Medical History:   Diagnosis Date   . Abnormal EKG  03/18/2017   . 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 Class 2 obesity due to excess calories without serious comorbidity with body mass index (BMI) of 38.0 to 38.9 in adult 04/30/2017   . Foot pain 06/23/2018   .  Ingrowing toenail 06/23/2018   . Nephrolithiasis    . Obesity with body mass index 30 or greater 09/05/2016   . Osteoarthritis of knee 09/05/2016   . Pain of right thumb 02/06/2016   . Patellofemoral stress syndrome 08/22/2016   . 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 Pemphigus vulgaris 03/18/2019   . Prediabetes 06/14/2018   . Shortness of breath 03/18/2017   . Strain of muscle of right hip 09/05/2016   . Synovitis 02/06/2016     Past Surgical History:   Procedure Laterality Date   . CHOLECYSTECTOMY  2017   . APPENDECTOMY  2002     Social History     Socioeconomic History   . Marital status: Married   Tobacco Use   . Smoking status: Never   . Smokeless tobacco: Never   Vaping Use   . Vaping Use: Never used   Substance and Sexual Activity   . Alcohol use: Not Currently   . Drug use: Never   . Sexual activity: Yes     Partners: Male   Social History Narrative    Married, one teenager daughter, no pets, homemaker, yoga, walks for exercise when her knee does not hurt           Current Outpatient Medications   Medication Sig Dispense Refill   . ALPRAZolam (XANAX) 0.5 MG tablet Take 1 tablet (0.5 mg) by mouth nightly as needed for Insomnia. 15 tablet 0   . buPROPion (WELLBUTRIN XL) 150 MG XL tablet Take 1 tablet (150 mg) by mouth every morning. 90 tablet 3   . hyoscyamine (ANASPAZ) 0.125 MG tablet Take 1 tablet (0.125 mg) by mouth every 4 hours as needed for Cramping. 120 tablet 0   . Multiple Vitamin (MULTIVITAMIN) capsule Take 1 capsule by mouth daily. 30 tablet 0   . mycoPHENOLate mofetil (CELLCEPT) 500 MG tablet Take 2 tablets (1,000 mg) by mouth 2 times daily. 360 tablet 3   . nystatin-triamcinolone (MYCOGEN II) 100000-0.1 UNIT/GM-% cream Apply 1 Application topically 2 times daily as needed (intertrigo in armpit). For  rash under arms 60 g 3   . valACYclovir (VALTREX) 500 MG tablet Take 1 tablet (500 mg) by mouth daily. 90 tablet 3     No current facility-administered medications for this visit.     No Known Allergies    Labs and Other Data  Lab Results   Component Value Date    SODIUM 141 11/21/2021    K 4.5 11/21/2021    CL 106 11/21/2021    CO2 27 11/21/2021    BUN 7 11/21/2021    CREAT 0.7 11/21/2021    GLU 79 11/21/2021    [place] 9.1 11/21/2021     Lab Results   Component Value Date    AST 23 11/21/2021    ALT 15 11/21/2021    ALK 100 11/21/2021    ALB 4.2 11/21/2021    TBILI 0.3 11/21/2021     Lab Results   Component Value Date    WBCCOUNT 10.3 11/21/2021    RBC 4.74 11/21/2021    HGB 14.1 11/21/2021    HCT 40.9 11/21/2021    MCV 86.4 11/21/2021    MCHC 34.3 11/21/2021    RDW 13.8 11/21/2021    PLT 374 11/21/2021    MPVH 8.6 11/21/2021     No results found for: INR, PTT  No results found for: PH, PO2, PCO2    Anesthesia Plan:  Risks and Benefits of Anesthesia  I personally  examined the patient immediately prior to the anesthetic and reviewed the pertinent medical history, drug and allergy history, laboratory and imaging studies and consultations. I have determined that the patient has had adequate assessment and testing.    Anesthetic techniques, invasive monitors, anesthetic drugs for induction, maintenance and post-operative analgesia, risks and alternatives have been explained to the patient and/or patient's representatives.    I have prescribed the anesthetic plan:         Planned anesthesia method: General and Monitored Anesthesia Care         ASA 3 (Severe systemic disease)     Potential anesthesia problems identified and risks including but not limited to the following were discussed with patient and/or patient's representative: Dental injury or sore throat, Injury to brain, heart and other organs and Patient declined further  discussion of risks of anesthesia    No Beta Blocker Indicated: Planned monitoring method:  Routine monitoring    Informed Consent:  Anesthetic plan and risks discussed with Patient.

## 2021-12-26 ENCOUNTER — Inpatient Hospital Stay (INDEPENDENT_AMBULATORY_CARE_PROVIDER_SITE_OTHER)
Admit: 2021-12-26 | Discharge: 2021-12-26 | Disposition: A | Discharge status: Home Health | Payer: No Typology Code available for payment source | Attending: Ob/Gyn | Admitting: Ob/Gyn

## 2021-12-26 DIAGNOSIS — N858 Other specified noninflammatory disorders of uterus: Secondary | ICD-10-CM

## 2021-12-26 DIAGNOSIS — N939 Abnormal uterine and vaginal bleeding, unspecified: Secondary | ICD-10-CM

## 2021-12-27 ENCOUNTER — Encounter: Payer: Self-pay | Admitting: Family Practice

## 2022-01-02 ENCOUNTER — Telehealth: Payer: Self-pay

## 2022-01-02 NOTE — Telephone Encounter (Signed)
Called patient back, she wanted to ensure that she has female nurses in pre-op and procedure room. Patient requesting triple split instructions, sent via mychart

## 2022-01-02 NOTE — Telephone Encounter (Signed)
Patient stated that she has medical questions regarding her procedure on 01/08/2022    Please call  Thank you

## 2022-01-05 NOTE — Progress Notes (Unsigned)
Anita Cloud MD  [facility]  Date: Monday January 06, 2022    Sick Visit Note    Chief Concern:   No chief complaint on file.    History of Present Illness:  Patient is a 51 year old female w/ medical history as below here for insomnia.     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 On cellcept. Stopped prednisone 07/2021. Previously on rituxan and IVIG   Obesity - There is no height or weight on file to calculate BMI. Active 2x/day w/ yoga for >6 months. Eating healthy.    PreDM - A1c 5.3 (04/20)   Kidney stones  - last kidney stone in 2004.    Fatty liver - seen on imaging 11/2020   History of COVID-19 - follows w/ COVID-19 recovery clinic. Prescribed pepcid, tessalon, NAC but not taking.   Stress incontinence  - referred to PT but doing yoga instead.    B12 deficiency - was taking 1,044mg daily but stopped. Last level 310 11/2021.     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    Anxiety/MDD - on wellbutrin XL '150mg'$  daily and klonopin 0.'5mg'$  PRN. Klonopin made her too sleepy long term and prefers xanax PRN.   LSIL - pap 10/2021 w/ LSIL negative HPV. Repeat 10/2022 w/ OBGYN.    Abdominal cramping - present since cholecystectomy. Has been prescribed bile X. Started on hyoscamine 11/2021. Referred for colonoscopy last visit.   Health Care Maintenance   Screening  . Lung [place] screening - n/a   . Colon [place] screening - due. Referred for colonoscopy 11/2021.   .Marland KitchenCervical [place] Screening - LSIL negative HPV 10/04/2021. Repeat 1 year.   . Breast [place] Screening- 06/2021 BI-RADS 1   . Tobacco Use Screening - non-smoker   . Alcohol Use Screening - negative  . DM screening A1c 5.3 (04/20)  . Drug Use Screening -  negative  . Depression Screening - negative  . Obesity screening - There is no height or weight on file to calculate BMI.  .Marland KitchenGC/CT  screening - n/a   . HIV screening - 08/12/2021 NR    . Hepatitis C screening -due    . Intimate Partner Violence screening - negative    . Latent TB screening -  Negative quantiferon 11/2021  . Folate acid supplementation - n/a   . Osteoporosis screening: n/a  Vaccinations:  . Influenza vaccine: n/a    . TDaP - 2022    . Shingles- n/a   . Pneumococcal vaccine: PPSV-23 03/2021.   .Marland KitchenHepatitis A: n/a    . Hepatitis B: n/a    . HPV vaccine: n/a   . Meningococcal: n/a      I have fully reviewed the past medical, surgical, social and family history and updated the Histories section of EpicCare.    Past Medical History:   Patient Active Problem List   Diagnosis   . Pemphigus vulgaris   . Pemphigus foliaceus   . Abnormal EKG   . Class 2 severe obesity due to excess calories with serious comorbidity and body mass index (BMI) of 38.0 to 38.9 in adult (CMS-HCC)   . Osteoarthritis of knee   . Prediabetes   . Nephrolithiasis   . Vitreomacular adhesion of both eyes   . Age-related nuclear cataract of both  eyes   . Dyslipidemia   . Fatty liver   . Personal history of COVID-19-July 2022   . B12 deficiency   . Female stress incontinence   . Dry mouth   . 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 Other specified congenital anomaly of skin   . Current use of steroid medication   . Recurrent HSV (herpes simplex virus)   . Papanicolaou smear of cervix with low grade squamous intraepithelial lesion (LGSIL)   . Anxiety and depression     Past Surgical History:   Past Surgical History:   Procedure Laterality Date   . CHOLECYSTECTOMY  2017   . APPENDECTOMY  2002     OB History  OB History   Gravida Para Term Preterm AB Living   '2 1 1 '$ 0 1 1   SAB IAB Ectopic Multiple Live Births   0 1 0 0 0     Family History:   Family History   Problem Relation Name Age of Onset   . Diabetes Mother          HTN, DJD   . Neurological Disease (Parkinsons/Huntingtons/Huntingtons Chorea) Father          DM, alcoholism   . Diabetes Father     . No Known Problems  Sister     . Thyroid Brother     . Cataract Maternal Grandfather     . Cataract Paternal Grandfather       Allergies:   No Known Allergies    Medications:    Current Outpatient Medications:   .  ALPRAZolam (XANAX) 0.5 MG tablet, Take 1 tablet (0.5 mg) by mouth nightly as needed for Insomnia., Disp: 15 tablet, Rfl: 0  .  buPROPion (WELLBUTRIN XL) 150 MG XL tablet, Take 1 tablet (150 mg) by mouth every morning., Disp: 90 tablet, Rfl: 3  .  hyoscyamine (ANASPAZ) 0.125 MG tablet, Take 1 tablet (0.125 mg) by mouth every 4 hours as needed for Cramping., Disp: 120 tablet, Rfl: 0  .  Multiple Vitamin (MULTIVITAMIN) capsule, Take 1 capsule by mouth daily., Disp: 30 tablet, Rfl: 0  .  mycoPHENOLate mofetil (CELLCEPT) 500 MG tablet, Take 2 tablets (1,000 mg) by mouth 2 times daily., Disp: 360 tablet, Rfl: 3  .  nystatin-triamcinolone (MYCOGEN II) 532992-4.2 UNIT/GM-% cream, Apply 1 Application topically 2 times daily as needed (intertrigo in armpit). For rash under arms, Disp: 60 g, Rfl: 3  .  valACYclovir (VALTREX) 500 MG tablet, Take 1 tablet (500 mg) by mouth daily., Disp: 90 tablet, Rfl: 3    Social History:   Social History     Socioeconomic History   . Marital status: Married     Spouse name: Not on file   . Number of children: Not on file   . Years of education: Not on file   . Highest education level: Not on file   Occupational History   . Not on file   Tobacco Use   . Smoking status: Never   . Smokeless tobacco: Never   Vaping Use   . Vaping Use: Never used   Substance and Sexual Activity   . Alcohol use: Not Currently   . Drug use: Never   . Sexual activity: Yes     Partners: Male   Other Topics Concern   . Not on file   Social History Narrative    Married, one teenager daughter, no pets, homemaker, yoga, walks for exercise when her knee does not hurt  Social Determinants of Health     Financial Resource Strain: Not on file   Food Insecurity: Not on file   Transportation Needs: Not on file   Physical Activity:  Not on file   Stress: Not on file   Social Connections: Not on file   Intimate Partner Violence: Not on file   Housing Stability: Not on file     Review of Systems (ROS):   Per HPI    Physical Exam  Vital signs:   There were no vitals filed for this visit. There is no height or weight on file to calculate BMI.  General Appearance: The patient is well-developed, well-nourished, and in no acute distress at the time of the exam. She is able to sit comfortably on the examination table without difficulty.  Skin: Gross inspection of skin demonstrates no evidence of abnormality. Hair and nails are normal. Skin is warm and dry.   Head: Patient's head is normocephalic, without any gross bulging or retraction noted.   Eyes: Pupils are equal, round, reactive to light and accomodation. Extraocular muscles are intact. Eyelids appear normal with no signs of ptosis, lidlag, or lid edema.   ENT: Both ears appear grossly normal, canal are clear, tympanic membranes are intact without abnormality, Nasal mucosa is clear, no lesions or discharge. Throat, oral cavity and tongue normal.   Neck:  Neck supple.  Respiratory: Lungs are clear to auscultation bilaterally. No wheezing, rales, rubs, or rhonchi noted. Good inspiratory effort noted and thorax rises symmetrically.   Cardiovascular:  Normal sinus rhythm with a normal S1 and S2 without audible click, murmur or rub. No S3 auscultated.  Abdomen:  The abdomen is soft, non-tender without rebound, gaurding, or ridgidity. Normal bowel sounds are appreciated in all quadrants, no bruits heard. No palpable masses or hepatosplenomegaly.  Extremities:  Extremities, and peripheral pulses are normal. No edema.   Neuro: Grossly normal    Assessment & Plan:  Patient is a 51 year old female who presents to discuss insomnia.    Follow up: Return to clinic 3-6 month     Venetia Constable, MD

## 2022-01-06 ENCOUNTER — Ambulatory Visit: Payer: No Typology Code available for payment source | Admitting: Family Practice

## 2022-01-06 ENCOUNTER — Encounter: Payer: Self-pay | Admitting: Family Practice

## 2022-01-06 VITALS — BP 115/80 | HR 72 | Temp 98.3°F | Ht 60.0 in | Wt 198.8 lb

## 2022-01-06 DIAGNOSIS — G4700 Insomnia, unspecified: Secondary | ICD-10-CM

## 2022-01-06 DIAGNOSIS — N951 Menopausal and female climacteric states: Secondary | ICD-10-CM

## 2022-01-06 NOTE — Patient Instructions (Signed)
Okay to take your medications before colonoscopy  Sleep study - if normal, contact me about sleep medicine.  Labs for menopause sent to pharmacy. No need to be fasting.

## 2022-01-06 NOTE — Interdisciplinary (Signed)
Patient states she has been taking melatonin but finds its hard to stay asleep.

## 2022-01-08 ENCOUNTER — Encounter: Admission: RE | Disposition: A | Discharge status: Home Health | Payer: Self-pay

## 2022-01-08 ENCOUNTER — Ambulatory Visit (HOSPITAL_BASED_OUTPATIENT_CLINIC_OR_DEPARTMENT_OTHER): Payer: No Typology Code available for payment source

## 2022-01-08 ENCOUNTER — Ambulatory Visit: Payer: No Typology Code available for payment source

## 2022-01-08 ENCOUNTER — Ambulatory Visit: Payer: Commercial Managed Care - PPO | Admitting: Ob/Gyn

## 2022-01-08 ENCOUNTER — Ambulatory Visit
Admission: RE | Admit: 2022-01-08 | Discharge: 2022-01-08 | Disposition: A | Discharge status: Home / Self Care | Payer: No Typology Code available for payment source

## 2022-01-08 DIAGNOSIS — Z9049 Acquired absence of other specified parts of digestive tract: Secondary | ICD-10-CM | POA: Insufficient documentation

## 2022-01-08 DIAGNOSIS — Z1211 Encounter for screening for malignant neoplasm of colon: Secondary | ICD-10-CM | POA: Insufficient documentation

## 2022-01-08 DIAGNOSIS — E669 Obesity, unspecified: Secondary | ICD-10-CM | POA: Insufficient documentation

## 2022-01-08 DIAGNOSIS — K648 Other hemorrhoids: Secondary | ICD-10-CM | POA: Insufficient documentation

## 2022-01-08 DIAGNOSIS — Z6838 Body mass index (BMI) 38.0-38.9, adult: Secondary | ICD-10-CM | POA: Insufficient documentation

## 2022-01-08 SURGERY — COLONOSCOPY, WITH ANESTHESIA
Anesthesia: Monitored Anesthesia Care (MAC)

## 2022-01-08 MED ORDER — PLASMA-LYTE A IV SOLN
INTRAVENOUS | Status: DC | PRN
Start: 2022-01-08 — End: 2022-01-08

## 2022-01-08 MED ORDER — PROPOFOL 1000 MG/100ML IV EMUL
INTRAVENOUS | Status: AC
Start: 2022-01-08 — End: ?
  Filled 2022-01-08: qty 100

## 2022-01-08 MED ORDER — PROPOFOL 1000 MG/100ML IV EMUL
INTRAVENOUS | Status: DC | PRN
Start: 2022-01-08 — End: 2022-01-08
  Administered 2022-01-08: 150 ug/kg/min via INTRAVENOUS

## 2022-01-08 MED ORDER — MIDAZOLAM HCL 2 MG/2ML IJ SOLN
INTRAMUSCULAR | Status: DC | PRN
Start: 2022-01-08 — End: 2022-01-08
  Administered 2022-01-08: 2 mg via INTRAVENOUS

## 2022-01-08 MED ORDER — MIDAZOLAM HCL (PF) 2 MG/2ML IJ SOLN
INTRAMUSCULAR | Status: AC
Start: 2022-01-08 — End: ?
  Filled 2022-01-08: qty 2

## 2022-01-08 NOTE — H&P (Signed)
Anita Turner is a 51 year old female,   Scheduled for Procedure(s):  COLONOSCOPY, WITH ANESTHESIA on   01/08/2022    PMHx:   Past Medical History:   Diagnosis Date   . Abnormal EKG 03/18/2017   . 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 Class 2 obesity due to excess calories without serious comorbidity with body mass index (BMI) of 38.0 to 38.9 in adult 04/30/2017   . Foot pain 06/23/2018   . Ingrowing toenail 06/23/2018   . Nephrolithiasis    . Obesity with body mass index 30 or greater 09/05/2016   . Osteoarthritis of knee 09/05/2016   . Pain of right thumb 02/06/2016   . Patellofemoral stress syndrome 08/22/2016   . 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 Pemphigus vulgaris 03/18/2019   . Prediabetes 06/14/2018   . Shortness of breath 03/18/2017   . Strain of muscle of right hip 09/05/2016   . Synovitis 02/06/2016     Surgical HX:   Past Surgical History:   Procedure Laterality Date   . CHOLECYSTECTOMY  2017   . APPENDECTOMY  2002     Home meds:   Medications Prior to Admission   Medication Sig Dispense Refill Last Dose   . ALPRAZolam (XANAX) 0.5 MG tablet Take 1 tablet (0.5 mg) by mouth nightly as needed for Insomnia. 15 tablet 0 Past Month   . buPROPion (WELLBUTRIN XL) 150 MG XL tablet Take 1 tablet (150 mg) by mouth every morning. 90 tablet 3 01/07/2022   . hyoscyamine (ANASPAZ) 0.125 MG tablet Take 1 tablet (0.125 mg) by mouth every 4 hours as needed for Cramping. 120 tablet 0 Unknown   . Multiple Vitamin (MULTIVITAMIN) capsule Take 1 capsule by mouth daily. 30 tablet 0 Past Week   . mycoPHENOLate mofetil (CELLCEPT) 500 MG tablet Take 2 tablets (1,000 mg) by mouth 2 times daily. 360 tablet 3 01/06/2022   . nystatin-triamcinolone (MYCOGEN II) 100000-0.1 UNIT/GM-% cream Apply 1 Application topically 2 times daily as needed (intertrigo in armpit). For rash under arms 60 g 3 Unknown   . valACYclovir (VALTREX) 500 MG tablet Take 1 tablet (500 mg) by mouth daily. 90 tablet 3 01/06/2022       ALLERGIES:  No Known Allergies    Exam:      01/08/22  1145   BP: 145/61   Pulse: 83   Temp: 97.2 F (36.2 C)   Resp: 18   SpO2: 99%       Physical Exam:  H&P Additional Comments: MAC  General Appearance: Well appearing and No acute distress  Respiratory: Good chest wall excursion and Breathing unlabored  Neurologic: Awake, Alert and Oriented  GI: abdomen non-distended, bowel sounds present and non-tender  Cardiovascular: RRR  Extremities: No cyanosis, clubbing, or edema  ASA Classification: class 2 - patient with mild systemic disease        Mallampati score:  I - soft palate, uvula, fauces, pillars visible    Sedation to be used?: Yes    Mouth Opening:  Age appropriate  Dentition:  Normal  Jaw protrusion:  Normal  Thyromental distance:  Age appropriate  Neck Anatomy:  Normal  C-spine mobility:  Full    History of difficult intubation:  No    Sedation Level Intended:  Moderate    Risk and Benefits: The indications, risks, benefits, and alternatives or procedure discussed with patient. and Patient willing to proceed and signed consent.      LABS:  No visits with results  within 3 Day(s) from this visit.   Latest known visit with results is:   Hospital Outpatient Visit on 11/21/2021   Component Date Value Ref Range Status   . White Bld Cell Count 11/21/2021 10.3  4.0 - 10.5 THOUS/MCL Final   . RBC 11/21/2021 4.74  3.70 - 5.00 MILL/MCL Final   . Hgb 11/21/2021 14.1  11.5 - 15.0 G/DL Final   . Hematocrit 11/21/2021 40.9  34.0 - 44.0 % Final   . MCV 11/21/2021 86.4  81.5 - 97.0 FL Final   . MCH 11/21/2021 29.7  27.0 - 33.5 PG Final   . MCHC 11/21/2021 34.3  32.0 - 35.5 G/DL Final   . RDW-CV 11/21/2021 13.8  11.6 - 14.4 % Final   . PLT Count 11/21/2021 374  150 - 400 THOUS/MCL Final   . MPV 11/21/2021 8.6  7.2 - 11.7 FL Final   . Diff Type 11/21/2021 MANUAL DIFFERENTIAL PERFORMED   Final   . Seg Neutro % (M) 11/21/2021 69.9  % Final   . Seg Neutro Abs (M) 11/21/2021 7.2  2.0 - 8.1 THOUS/MCL Final   . Lymphocytes %. 11/21/2021 20.4  % Final   . Lymphocytes Absolute  11/21/2021 2.1  0.9 - 3.3 THOUS/MCL Final   . Monocytes % 11/21/2021 3.9  % Final   . Monocytes Absolute 11/21/2021 0.4  0.0 - 0.8 THOUS/MCL Final   . Eosinophils % 11/21/2021 4.8  % Final   . Eosinophils Absolute 11/21/2021 0.5  0.0 - 0.5 THOUS/MCL Final   . Basophils % 11/21/2021 1.0  % Final   . Basophils Absolute 11/21/2021 0.1  0.0 - 0.2 THOUS/MCL Final   . RBC Morphology 11/21/2021 NORMAL   Final   . Platelet Morphology 11/21/2021 NORMAL   Final   . QuantiFERON-TB Gold Plus, Result 11/21/2021 NEGATIVE  NEGATIVE Final   . QuantiFERON-TB Gold Plus, Comment 11/21/2021     Final    Comment: M. tuberculosis is NOT likely. A Negative result indicates no significant   interferon-gamma release (IGRA) by T cells (CD4 in TB1 and CD4 and CD8 in TB2)   to specific antigens for M. tuberculosis complex (excluding BCG), suggesting no   old or current TB infection. The Nil control value represents baseline   nonspecific reactivity without antigens.  The Mitogen minus Nil is a positive control for response of the patients T   lymphocytes.  In patients with latent tuberculosis infection (LTBI) and immune-suppressing disease or medication, or seriously ill with active tuberculosis, or very recently infected, the IGRA result may be falsely negative. In patients from high-incidence countries   with clinically suspect LTBI, a negative IGRA result may be confirmed by a repeat specimen or by a tuberculin skin test.     . QuantiFERON  NIL 11/21/2021 0.13  IU/mL Final   . QuantiFERON TB1 minus NIL 11/21/2021 0.00  IU/mL Final    (Reference Range: 0.00-0.35 IU/mL)   . QuantiFERON TB2 minus NIL 11/21/2021 0.00  IU/mL Final    (Reference Range: 0.00-0.35 IU/mL)   . QuantiFERON MITOGEN minus NIL 11/21/2021 9.87  IU/mL Final   . Vitamin D, 25-Hydroxy, Total 11/21/2021 37.1  >30.0 ng/mL Final    Comment:    Reference Ranges:  Deficient:          <20.0      ng/mL  Insufficient:       20.0-29.9  ng/mL  Sufficient:         30.0-100.0  ng/mL  Upper Safety  Limit: >100.0     ng/mL     . Vitamin B12 Lvl 11/21/2021 310  180 - 1,241 PG/ML Final   . Sodium 11/21/2021 141  136 - 145 mmol/L Final   . Potassium 11/21/2021 4.5  3.5 - 5.1 mmol/L Final   . Chloride 11/21/2021 106  98 - 107 mmol/L Final   . CO2 11/21/2021 27  21 - 31 mmol/L Final   . Electrolyte Balance 11/21/2021 8  2 - 12 mmol/L Final   . Glucose 11/21/2021 79  70 - 115 mg/dL Final    Comment:    Normal Fasting Glucose:  <100 mg/dL  Impaired Fasting Glucose: 100-125 mg/dL  Provisional DX of diabetes(must be confirmed) >125 mg/dL     . BUN 11/21/2021 7  7 - 25 mg/dL Final   . Creat 11/21/2021 0.7  0.6 - 1.2 mg/dL Final   . eGFR - low estimate 11/21/2021 >60  >59 Final   . eGFR - high estimate 11/21/2021 >60  >59 Final    Comment: (Unit: mL/min/1.73 sq mtr)  The calculated GFR is an estimate and is NOT an accurate reflection of GFR in   patients on dialysis. The accuracy of estimated GFR is also affected by muscle   mass, and medications that affect renal tubular secretion of creatinine.  If estimated GFR will directly affect clinical decision making, consider using cystatin C to estimate GFR, and/or consult with nephrology. eGFR was calculated using the MDRD equation (2006).     . Calcium 11/21/2021 9.1  8.6 - 10.3 mg/dL Final   . Protein, Total 11/21/2021 6.7  6.0 - 8.3 G/DL Final   . Albumin 11/21/2021 4.2  3.7 - 5.3 G/DL Final   . Alk Phos 11/21/2021 100  34 - 104 U/L Final   . AST 11/21/2021 23  13 - 39 U/L Final   . ALT 11/21/2021 15  7 - 52 U/L Final   . Bilirubin, Total 11/21/2021 0.3  0.0 - 1.4 mg/dL Final   . Glycated Hgb, A1C 11/21/2021 5.3  4.6 - 5.6 % Final    Comment: (NOTE)  Reference values for HgA1c:        Non-Diabetic: 4.6 - 5.6%    HbA1c cutoffs for assessing diabetes:      Increased risk for diabetes: 5.7 - 6.4%      Diabetes:  >6.4%    ADA recommended HbA1c goals in treatment of diabetes:      Adults: <7.0%      Children and adolescents with type I Diabetes: <7.5%       Comment: A lower goal of <7.0% is reasonable if it      can be achieved without excessive hypoglycemia.      Goals should be individualized.    Reference: Diabetes Care January 2017.     . TSH, Ultrasensitive 11/21/2021 2.247  0.450 - 4.120 uIU/mL Final    Comment: (NOTE)  If applicable:  Pregnancy Reference Intervals: First Trimester: 0.050 - 3.700 uIU/mL    In pregnancy TSH can be reduced by approximately 0.4 uIU/mL with the   upper reference limit reduced by approximately 0.5 uIU/mL. This limit   should generally be applied at the end of the first trimester,   beginning weeks 7-12, with a gradual return towards the non-pregnant   range in the second and third trimesters.   Sheppard Coil EK et al. Thyroid [phone], 2017).

## 2022-01-08 NOTE — Procedures (Signed)
COLONOSCOPY PROCEDURE REPORT    NAME:     Anita Turner, Anita Turner DATE:     01/08/2022  MRN:     [id number]      PROCEDURE:     Colonoscopy, screening  ACCT #:     [id number]     ATTENDING:     Lincoln Brigham, MD  DOB:     08-Mar-1971     REFERRING PHYSICIAN(S):  STATUS:     Outpatient    INDICATIONS:     The patient is a 51 yr old female who presents for  average risk patient for colorectal cancer.    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  On cellcept.  Stopped prednisone 07/2021.  Previously on rituxan and IVIG  Obesity - Body mass index is 38.83 kg/m''.  Active 2x/day w/ yoga for >6  months.  Eating healthy.  PreDM - A1c 5.3 (04/20)  Kidney stones - last kidney stone in 2004.  Fatty liver - seen on imaging 11/2020  Stress incontinence - referred to PT but doing yoga instead.  B12 deficiency - was taking 1,059mg daily but stopped.  Last level 310  11/2021.  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  Anxiety/MDD - on wellbutrin XL 15087maily and klonopin 0.5mg53mN.  Klonopin made her too sleepy long term and prefers xanax PRN.  LSIL - pap 10/2021 w/ LSIL negative HPV.  Repeat 10/2022 w/ OBGYN.  Abdominal cramping - present since cholecystectomy.  Has been prescribed  bile X.  Started on hyoscamine 11/2021.  Referred for colonoscopy last  visit.    CONSENT:  The indications, alternatives, risks, benefits and limitations  of the planned procedure(s) were fully explained to the patient in  detail.  The risks include, but are not limited to: pain, discomfort,  inability to complete the procedure, need to repeat the procedure,  missed pathology or interval development of pathology, specific  procedural or sedation associated risks of bleeding, perforation,  infection, cardiopulmonary compromise, need for hospitalization,  urgent  or emergent surgery, up to and including death were fully explained.  The patient had the opportunity to ask questions, all of which were  fully answered.  The patient agreed to proceed with the planned  procedure(s).  Informed consent was obtained.  The patient's medical  and drug history, physical status, the results of relevant diagnostic  studies and the choice of sedation and procedure were assessed prior to  the administration of intravenous sedation.    MEDICATION:     Per Anesthesia  ENDOSCOPE(S):     PCF-H190DL ([id number] PROCEDURE:  Digital rectal exam was performed and revealed no  abnormalities of the rectum.      The PCF-H190DL ([id number]  introduced through the anus and advanced to the terminal ileum which  was intubated for a short distance.  The overall prep quality was  excellent.  The instrument was then slowly withdrawn while examining  the mucosa circumferentially.   The scope was then completely withdrawn  from the patient and the procedure terminated. The views were  excellent. The patient's toleration of the procedure was excellent. The  pulse, BP, and O2 saturation were monitored and documented by the  physician and the nursing staff throughout the entire procedure.  The  patient was cared for as planned according to standard protocol.  The  patient was then discharged to recovery in stable condition and with  appropriate post procedure care.    FINDINGS:  COLON FINDINGS: The colonic mucosa appeared normal in the terminal ileum  and throughout the entire examined colon.   Moderate sized internal  hemorrhoids were found.    UNPLANNED EVENTS:     There were no unplanned events.  EST. BLOOD LOSS:     NA  ICD:     Z12.11 Encounter for screening for malignant neoplasm of colon    SUGGESTED CPT:     [phone] Colonoscopy, flexible, proximal to splenic  flexure; diagnostic, with or without collection of specimen(s) by  brushing or washing, with or without colon decompression  (separate  procedure)  IMPRESSION:     1.  Internal hemorrhoids  2.  Normal colonoscopy otherwise      RECOMMENDATIONS:     1.  Discharge home when standard parameters are met  2.  Repeat Colonoscopy in 10 years for Colorectal Cancer Prevention,  unless new symptoms or risk factors develop in the interim.  3.  The findings are reassuring.  No concerning pathological features  were present.    PERFORMED BY:     The procedure was performed by Lincoln Brigham, MD   .  Lincoln Brigham, MD was present for the entire viewing portion of the  procedure.    eSigned:  Lincoln Brigham, MD 01/08/2022 12:47 PM                    *NAME:     Anita Turner      DATE:     01/08/2022  MRN:     [id number]      SEX:     female  ACCT#:     [id number]     DOB:     Oct 03, 1970, 51 yr old  STATUS:     Outpatient

## 2022-01-08 NOTE — Discharge Instructions (Signed)
Anita Turner    [id number] [place]  OUTPATIENT DISCHARGE INSTRUCTIONS    01/08/2022  12:41 PM          Procedure(s):  COLONOSCOPY, WITH ANESTHESIA        Surgeon(s) and Role:     * Andria Rhein, MD - Primary         Patient Education: Refer to Nursing Assessment Form.      Demonstrates readiness to learn: yes      Considerations/Barriers to Learning: None    If yes, explain: Plan:      Language Preference: English      Patient learning preference: Verbal, Written and Hands-on      Teaching done with: Patient and Family       Activity: Resume normal activity tomorrow.       Sedation: You received sedatives during your procedure.     The medications were:  Under Anesthesia      You may feel sleepy/not yourself for several hours today., We suggest you remain with family or friends for rest of the day. and Do not drive, make important decisions, or drink alcohol for 24 hours.       Nutrition: Start with clear liquids and advance slowly as tolerated.       Medications: Continue home medications.       Care of Wounds, Puncture Sites, Tubes, etc.: No special care needed.       Follow-up: If temperature greater than 101 F, excessive bleeding, green/yellow discharge or severe pain, call your MD or go to the nearest emergency department for evaluation., For non urgent questions regarding your procedure please call [phone], For URGENT after hours assistance please call [phone] and ask for the "on-call GI Fellow".       Procedure-Specific Instructions:    See attached special instructions.         Instructions given by: Earlene Plater, RN   01/08/2022 12:41 PM      ________________________________    Interpreter (if applicable)    *In an emergency, go to the nearest emergency room. Take this discharge instruction form with you*      I have read and understood above instructions & understand my responsibility for my health needs.    I have received a written copy of instructions.       ________________________________ __________________________________    Patient                                                      If other than patient, relationship to patient    ________________________________    Date and Time

## 2022-01-08 NOTE — Anesthesia Postprocedure Evaluation (Signed)
Anesthesia Post Note    Patient: Anita Turner    Procedure(s) Performed: Procedure(s):  COLONOSCOPY, WITH ANESTHESIA      Final anesthesia type: Monitored Anesthesia Care    Patient location: PACU    Post anesthesia pain: adequate analgesia    Mental status: awake, alert  and oriented    Airway Patent: Yes    Last Vitals:    Vitals Value Taken Time   BP 120/60 01/08/22 1246   Temp 36 01/08/22 1246   Pulse 91 01/08/22 1246   Resp 15 01/08/22 1246   SpO2 100 % 01/08/22 1246   Vitals shown include unvalidated device data.     Temperature >35.5: Yes    Post vital signs: stable    Hydration: adequate    N/V:no    Plan of care per primary team.

## 2022-01-22 ENCOUNTER — Encounter: Payer: Self-pay | Admitting: Family Practice

## 2022-01-28 ENCOUNTER — Encounter: Payer: Self-pay | Admitting: Family Practice

## 2022-01-28 ENCOUNTER — Ambulatory Visit (INDEPENDENT_AMBULATORY_CARE_PROVIDER_SITE_OTHER): Payer: Self-pay | Admitting: Registered"

## 2022-01-28 DIAGNOSIS — Z713 Dietary counseling and surveillance: Secondary | ICD-10-CM

## 2022-01-28 DIAGNOSIS — E6609 Other obesity due to excess calories: Secondary | ICD-10-CM

## 2022-01-28 DIAGNOSIS — Z6838 Body mass index (BMI) 38.0-38.9, adult: Secondary | ICD-10-CM

## 2022-01-28 DIAGNOSIS — K760 Fatty (change of) liver, not elsewhere classified: Secondary | ICD-10-CM

## 2022-01-28 DIAGNOSIS — N200 Calculus of kidney: Secondary | ICD-10-CM

## 2022-01-28 DIAGNOSIS — R7309 Other abnormal glucose: Secondary | ICD-10-CM

## 2022-01-28 DIAGNOSIS — R7303 Prediabetes: Secondary | ICD-10-CM

## 2022-01-28 LAB — FOLLICLE STIMULATING HORMONE, BLOOD: FSH: 5.1 m[IU]/mL

## 2022-01-28 LAB — ESTRADIOL, BLOOD: Estradiol: 29.3 pg/mL

## 2022-01-28 LAB — LUTEINIZING HORMONE(LH), BLOOD: LH: 4.6 m[IU]/mL

## 2022-01-28 NOTE — Patient Instructions (Signed)
Plan   Lean and Green   Motivation:  High  Recommendation for Pt:    Sleep Hygiene  30 minutes before going to bed turn off all blue  Start a 30 minute meditation practice or prayer (You tube, CALM, or Headspace)  At 2:30 in the morning (breathing in for 4 seconds hold for 5 seconds and out for 6 seconds), then meditate for 30 minutes   Order of consumption of macronutrients  Vegetables  Protein and fats  Simple carbohydrates  Hydration  Goal 2-3 liters per day  Drink 6 oz the minute you get up  6 oz before any meal or snack  Use different herbal teas, sparkling water, spa water  When in heat use electrolytes Keto (Drip Drop)  Dinner on salad plates (vegetables are free)   Meal Consume 3 meals per day  Protein  Cage free, pasture raised, grass fed and finished, wild caught, organic  3-4 oz of protein per meal either animal or plant  SMASH fish 2-3x/week  1-2 oz of protein after your workout  Carbohydrates:  3/4 your plate at each meal to be seasonal vegetables  1/4 of the plate to be seasonal fruit focus on berries at 2 meals  Fresh or frozen fruits or vegetables  Rainbow of colors   Reduce all white rice, bread, biscuits and sweets  Healthy fats:  Cook with avocado oil  Salad dressing olive oil, vinegar, herbs, and lemon  Nuts, seeds, olives  Avocados  Homework for Pt:   Supplements:  Ortho Reacted Magnesium before going to bed  CoQ10  1 gram of Fish oil  Exercise:  Continue Yoga  Walk for 15-20 minutes after any meal     Follow up/ Monitor weights and nutrition labs   60 Minutes MNT

## 2022-01-28 NOTE — Progress Notes (Signed)
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)   Will crave sweets if on her period   If moody will eat more food  Any Known Food Allergies:  NKFA  Sensitivities:  No  Eat out 2x/week  [place], Mediterranean (spicy)  24 Hour Recall:  Up at Early  Breakfast: 6:30 am  2x/week Soup and sprouts or fruit with biscuits  Lunch:  11:30 am Tea, vegetables (beans and rice) and bread  S:  Binge Eats  Dinner:  Beans white rice, bread and vegetables  If eats late will get GERD  Fluids 48 oz of water, coffee and tea  Stress Level: High  Sleep per night: Less than 5 hours 10:30-2:30 am then will toss and turn  Exercise: Yoga 7x/week  Forms of relaxation: Yoga  Supplements:   M/V   Vitamin S    OBJECTIVE:      Weight   197.2      SMM     49.4      BFM   105.9      BMI     38.5      PBF     53.7      ECT/TBW    0.386      Visceral Fat    235.9      Date   01-27-22        ASSESSMENT     Reviewed:    Mediterranean Diet     Plan  Lean and Green   Motivation:High  Recommendation for Pt:   Sleep Hygiene   30 minutes before going to bed turn off all blue   Start a 30 minute meditation practice or prayer (You tube, CALM, or Headspace)   At 2:30 in the morning (breathing in for 4 seconds hold for 5 seconds and out for 6 seconds), then meditate for 30 minutes   Order of consumption of macronutrients  . Vegetables  . Protein and fats  . Simple carbohydrates  Hydration  . Goal2-3liters per day  . Drink 6 oz the minute you get up  . 6 oz before any meal or snack  . Use different herbal teas, sparkling  water, spa water  . When in heat use electrolytesKeto(Drip Drop)  Dinner on salad plates (vegetables are free)  Meal Consume 3 meals per day  Protein  . Cage free, pasture raised, grass fed and finished, wild caught, organic  . 3-4oz  of protein per [employer] or plant  . SMASH fish 2-3x/week  . 1-2 oz of protein after your workout  Carbohydrates:  . 3/4 your plate at each meal to be seasonal vegetables  . 1/4 of the plate to be seasonal fruit focus on berries at 2 meals  . Fresh or frozen fruits or vegetables  . Rainbow of colors   . Reduce all white rice, bread, biscuits and sweets  Healthy fats:  . Cook with avocado oil  . Salad dressingolive oil, vinegar, herbs, and lemon  . Nuts, seeds, olives  . Avocados  Homework for Pt:  Supplements:   Ortho Reacted Magnesium before going to bed   CoQ10   1 gram of Fish oil  Exercise:  . Continue Yoga  . Walk for 15-20 minutes after any meal    Follow up/ Monitor weights and nutrition labs  60 Minutes MNT

## 2022-02-16 NOTE — Progress Notes (Unsigned)
Suzzanne Cloud MD  [facility]  Date: Monday February 17, 2022    Sick Visit Note    Chief Concern:   No chief complaint on file.    History of Present Illness:  Patient is a 51 year old female w/ medical history as below here for insomnia.     #Insomnia - last visit reported issues w/ insomnia for which she takes melatonin. Usually uses a dose anywhere between 3 to '9mg'$  before bed but waking up in the middle of the night. Can sometimes go back to sleep immediately but other times stays up. Wakes up to urinate. Has to schedule sleep study but worried about sleep apnea as she snores and wakes up not feeling refreshed. Will want to await results of sleep study to decide on medication other than melatonin but would want to avoid medication that is addictive or overly sedating. Tries hard to practice good sleep hygeien.     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 On cellcept. Stopped prednisone 07/2021. Previously on rituxan and IVIG   Obesity - There is no height or weight on file to calculate BMI. Active 2x/day w/ yoga for >6 months. Eating healthy.    PreDM - A1c 5.3 (04/20)   Kidney stones  - last kidney stone in 2004.    Fatty liver - seen on imaging 11/2020   History of COVID-19 - follows w/ COVID-19 recovery clinic. Prescribed pepcid, tessalon, NAC but not taking.   Stress incontinence  - referred to PT but doing yoga instead.    B12 deficiency - was taking 1,0106mg daily but stopped. Last level 310 11/2021.     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    Anxiety/MDD - on wellbutrin XL '150mg'$  daily and klonopin 0.'5mg'$  PRN. Klonopin made her too sleepy long term and prefers xanax PRN.   LSIL - pap 10/2021 w/ LSIL negative HPV. Repeat 10/2022 w/ OBGYN.    Abdominal cramping - present since cholecystectomy. Has been  prescribed bile X. Started on hyoscamine 11/2021. Referred for colonoscopy last visit.   Health Care Maintenance   Screening  . Lung [place] screening - n/a   . Colon [place] screening - due. Referred for colonoscopy 11/2021.   .Marland KitchenCervical Cook Screening - LSIL negative HPV 10/04/2021. Repeat 1 year.   . Breast [place] Screening- 06/2021 BI-RADS 1   . Tobacco Use Screening - non-smoker   . Alcohol Use Screening - negative  . DM screening A1c 5.3 (04/20)  . Drug Use Screening -  negative  . Depression Screening - negative  . Obesity screening - There is no height or weight on file to calculate BMI.  .Marland KitchenGC/CT screening - n/a   . HIV screening - 08/12/2021 NR    . Hepatitis C screening -due    . Intimate Partner Violence screening - negative    . Latent TB screening -  Negative quantiferon 11/2021  . Folate acid supplementation - n/a   . Osteoporosis screening: n/a  Vaccinations:  . Influenza vaccine: n/a    . TDaP - 2022    . Shingles- n/a   . Pneumococcal vaccine: PPSV-23 03/2021.   .Marland KitchenHepatitis A: n/a    . Hepatitis B: n/a    . HPV vaccine: n/a   . Meningococcal: n/a  I have fully reviewed the past medical, surgical, social and family history and updated the Histories section of EpicCare.    Past Medical History:   Patient Active Problem List   Diagnosis   . Pemphigus vulgaris   . Pemphigus foliaceus   . Abnormal EKG   . Class 2 severe obesity due to excess calories with serious comorbidity and body mass index (BMI) of 38.0 to 38.9 in adult (CMS-HCC)   . Osteoarthritis of knee   . Prediabetes   . Nephrolithiasis   . Vitreomacular adhesion of both eyes   . Age-related nuclear cataract of both eyes   . Dyslipidemia   . Fatty liver   . Personal history of COVID-19-July 2022   . B12 deficiency   . Female stress incontinence   . Dry mouth   . 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 Other specified congenital anomaly of skin   . Current use of steroid medication   . Recurrent HSV (herpes simplex virus)   . Papanicolaou smear of cervix with  low grade squamous intraepithelial lesion (LGSIL)   . Anxiety and depression     Past Surgical History:   Past Surgical History:   Procedure Laterality Date   . CHOLECYSTECTOMY  2017   . APPENDECTOMY  2002     OB History  OB History   Gravida Para Term Preterm AB Living   '2 1 1 '$ 0 1 1   SAB IAB Ectopic Multiple Live Births   0 1 0 0 0     Family History:   Family History   Problem Relation Name Age of Onset   . Diabetes Mother          HTN, DJD   . Neurological Disease (Parkinsons/Huntingtons/Huntingtons Chorea) Father          DM, alcoholism   . Diabetes Father     . No Known Problems Sister     . Thyroid Brother     . Cataract Maternal Grandfather     . Cataract Paternal Grandfather       Allergies:   No Known Allergies    Medications:    Current Outpatient Medications:   .  ALPRAZolam (XANAX) 0.5 MG tablet, Take 1 tablet (0.5 mg) by mouth nightly as needed for Insomnia., Disp: 15 tablet, Rfl: 0  .  buPROPion (WELLBUTRIN XL) 150 MG XL tablet, Take 1 tablet (150 mg) by mouth every morning., Disp: 90 tablet, Rfl: 3  .  hyoscyamine (ANASPAZ) 0.125 MG tablet, Take 1 tablet (0.125 mg) by mouth every 4 hours as needed for Cramping., Disp: 120 tablet, Rfl: 0  .  Multiple Vitamin (MULTIVITAMIN) capsule, Take 1 capsule by mouth daily., Disp: 30 tablet, Rfl: 0  .  mycoPHENOLate mofetil (CELLCEPT) 500 MG tablet, Take 2 tablets (1,000 mg) by mouth 2 times daily., Disp: 360 tablet, Rfl: 3  .  nystatin-triamcinolone (MYCOGEN II) 035009-3.8 UNIT/GM-% cream, Apply 1 Application topically 2 times daily as needed (intertrigo in armpit). For rash under arms, Disp: 60 g, Rfl: 3  .  valACYclovir (VALTREX) 500 MG tablet, Take 1 tablet (500 mg) by mouth daily., Disp: 90 tablet, Rfl: 3    Social History:   Social History     Socioeconomic History   . Marital status: Married     Spouse name: Not on file   . Number of children: Not on file   . Years of education: Not on file   . Highest education level: Not on  file   Occupational History    . Not on file   Tobacco Use   . Smoking status: Never   . Smokeless tobacco: Never   Vaping Use   . Vaping Use: Never used   Substance and Sexual Activity   . Alcohol use: Not Currently   . Drug use: Never   . Sexual activity: Yes     Partners: Male   Other Topics Concern   . Not on file   Social History Narrative    Married, one teenager daughter, no pets, homemaker, yoga, walks for exercise when her knee does not hurt         Social Determinants of Health     Financial Resource Strain: Not on file   Food Insecurity: Not on file   Transportation Needs: Not on file   Physical Activity: Not on file   Stress: Not on file   Social Connections: Not on file   Intimate Partner Violence: Not on file   Housing Stability: Not on file     Review of Systems (ROS):   Per HPI    Physical Exam  Vital signs:   There were no vitals filed for this visit. There is no height or weight on file to calculate BMI.  General Appearance: The patient is well-developed, well-nourished, and in no acute distress at the time of the exam. She is able to sit comfortably on the examination table without difficulty.  Skin: Gross inspection of skin demonstrates no evidence of abnormality. Hair and nails are normal. Skin is warm and dry.   Head: Patient's head is normocephalic, without any gross bulging or retraction noted.   Eyes:Extraocular muscles are intact. Eyelids appear normal with no signs of ptosis, lidlag, or lid edema.   Neck:  Neck supple.  Respiratory: Lungs are clear to auscultation bilaterally. No wheezing, rales, rubs, or rhonchi noted. Good inspiratory effort noted and thorax rises symmetrically.   Cardiovascular:  Normal sinus rhythm with a normal S1 and S2 without audible click, murmur or rub. No S3 auscultated.  Extremities: moving all extremities    Assessment & Plan:  Patient is a 51 year old female who presents to discuss insomnia.    1. Insomnia, unspecified type - refractory to melatonin. Issues w/ sleep maintenance. Will  await sleep study and if negative, consider pharmacotherapy other than melatonin such as trazodone vs. Doxepin.Practices good sleep hygeine.       Follow up:  PRN    Venetia Constable, MD

## 2022-02-17 ENCOUNTER — Encounter: Payer: Self-pay | Admitting: Family Practice

## 2022-02-17 ENCOUNTER — Ambulatory Visit: Payer: No Typology Code available for payment source | Admitting: Family Practice

## 2022-02-17 VITALS — BP 104/69 | HR 86 | Temp 98.6°F | Ht 60.0 in | Wt 200.0 lb

## 2022-02-17 DIAGNOSIS — F32A Depression, unspecified: Secondary | ICD-10-CM

## 2022-02-17 DIAGNOSIS — G4700 Insomnia, unspecified: Secondary | ICD-10-CM

## 2022-02-17 DIAGNOSIS — F419 Anxiety disorder, unspecified: Secondary | ICD-10-CM

## 2022-02-17 MED ORDER — ALPRAZOLAM 0.5 MG OR TABS
0.5000 mg | ORAL_TABLET | Freq: Every evening | ORAL | 2 refills | Status: DC | PRN
Start: 2022-02-17 — End: 2023-03-17

## 2022-02-17 NOTE — Patient Instructions (Addendum)
Continue xanax as needed  Consider zoloft or lexapro  Therapist per insurance  Sleep study if sleep not improved

## 2022-02-17 NOTE — Interdisciplinary (Signed)
Patient states she has been having trouble sleeping and believes it might be related to anxiety. Patient has been taking alprazolam x 4 days to help with her sleep.

## 2022-02-19 ENCOUNTER — Ambulatory Visit: Payer: Commercial Managed Care - PPO | Admitting: Ob/Gyn

## 2022-03-03 ENCOUNTER — Encounter: Payer: Self-pay | Admitting: Dermatology

## 2022-03-03 MED ORDER — TRIAMCINOLONE ACETONIDE 0.1 % EX CREA
1.0000 | TOPICAL_CREAM | Freq: Two times a day (BID) | CUTANEOUS | 1 refills | Status: DC
Start: 2022-03-03 — End: 2022-04-11

## 2022-03-10 ENCOUNTER — Telehealth: Payer: Self-pay | Admitting: Family Practice

## 2022-03-10 NOTE — Telephone Encounter (Signed)
Patient requesting sooner appointment.     Reason for sooner appointment:  Patient has a medical concern (not triage related) and needs a sooner appointment    Tentatively scheduled with Provider: Margaretha Glassing at Date/Time: 08/15 @ 11:20 Location: [facility]    Please assist.

## 2022-03-10 NOTE — Telephone Encounter (Signed)
Contacted pt to offer same-day appt with different provider. Patient stated request is not urgent and will wait for Dr Anita Turner appt on Tuesday.

## 2022-03-11 ENCOUNTER — Ambulatory Visit: Payer: Commercial Managed Care - PPO | Admitting: Family Practice

## 2022-03-18 ENCOUNTER — Ambulatory Visit: Payer: Commercial Managed Care - PPO | Admitting: Family Practice

## 2022-03-26 NOTE — Patient Instructions (Signed)
It was nice to see you in my clinic today. Thank you for allowing me to take part in your care. If I may be of any further assistance, please do not hesitate to contact [phone]. If you have not done so already, please contact the front desk on your way out for information on secure health messaging to communicate with me electronically. For your health, it is important that you have the following done in a timely manner.    # Please schedule an appointment with Dr. Aldeen Riga or your regular Primary Care Provider at the front desk in { } weeks. If you have any new or emergent concerns, please seek medical attention immediately or call 911. Please arrive to your appointment at least 20 minutes prior in order to check in and fill out paperwork.    # As we discussed, please work on diet and exercise as tolerated.     # You were given a referral to { }. If you do not hear about your referral within two weeks, please call [phone]    # You were given a lab slip to have your blood tests done. Please have them done { }.    # You were given the following immunizations today: { }    # Your medications were sent to your pharmacy.    # You will be receiving a call and email regarding a survey on the service you received today. Please take the one-minute survey to help us understand your experience and to continue providing you with great service in the future.    Sincerely,    Zafiro Routson, MD  [facility], [place]  [phone]

## 2022-03-26 NOTE — Progress Notes (Unsigned)
Patient: Anita Turner  DOB: 03/06/1971  Primary Care Provider: Venetia Constable  BRIEF VISIT/FOLLOW UP-ADULT    Subjective:      Tonia Avino is a 51 year old Not Hispanic, Latino(a), or Spanish origin female who presents today for No chief complaint on file.      Current Concerns/Interval History:     #Back pain:  Notes that she has abdominal and back pain with her periods.  It resolves with her periods.  The abdominal cramping is normal for her during her periods, but she is also having it between her periods.  She is concerned because she has a history of kidney stones.  She had her IUD removed about one month ago.  Recently had ultrasound that showed thickened endometrium and fibroids. Has appointment for endometrial biopsy.  She has talked with her gyne about possible   Colonoscopy was done recently and negative either than for hemorrhoids.  The back pain pain is usually constant, dull, in her lower back.  She is taking Motrin 200 mg in morning and 200 at night.    #Sleep:  Notes that she has been having increased sleep difficulties    HCM:  COVID: due, declines      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)    Other specified congenital anomaly of skin    Current use of steroid medication    Recurrent HSV (herpes simplex virus)    Papanicolaou smear of cervix with low grade squamous intraepithelial lesion (LGSIL)    Anxiety and depression       Past Surgical History:  Past  Surgical History:   Procedure Laterality Date    CHOLECYSTECTOMY  2017    APPENDECTOMY  2002       Medications:    Current Outpatient Medications:     ALPRAZolam (XANAX) 0.5 MG tablet, Take 1 tablet (0.5 mg) by mouth nightly as needed for Insomnia., Disp: 30 tablet, Rfl: 2    buPROPion (WELLBUTRIN XL) 150 MG XL tablet, Take 1 tablet (150 mg) by mouth every morning., Disp: 90 tablet, Rfl: 3    hyoscyamine (ANASPAZ) 0.125 MG tablet, Take 1 tablet (0.125 mg) by mouth every 4 hours as needed for Cramping., Disp: 120 tablet, Rfl: 0    Multiple Vitamin (MULTIVITAMIN) capsule, Take 1 capsule by mouth daily., Disp: 30 tablet, Rfl: 0    mycoPHENOLate mofetil (CELLCEPT) 500 MG tablet, Take 2 tablets (1,000 mg) by mouth 2 times daily., Disp: 360 tablet, Rfl: 3    nystatin-triamcinolone (MYCOGEN II) 546270-3.5 UNIT/GM-% cream, Apply 1 Application topically 2 times daily as needed (intertrigo in armpit). For rash under arms, Disp: 60 g, Rfl: 3    triamcinolone (KENALOG) 0.1 % cream, Apply 1 Application. topically 2 times daily. As needed for rash on neck and chest, Disp: 80 g,  Rfl: 1    valACYclovir (VALTREX) 500 MG tablet, Take 1 tablet (500 mg) by mouth daily., Disp: 90 tablet, Rfl: 3    Allergies:  No Known Allergies    FAMILY HISTORY:  Family History   Problem Relation Name Age of Onset    Diabetes Mother          HTN, DJD    Neurological Disease (Parkinsons/Huntingtons/Huntingtons Chorea) Father          DM, alcoholism    Diabetes Father      No Known Problems Sister      Thyroid Brother      Cataract Maternal Grandfather      Cataract Paternal Grandfather         SOCIAL HISTORY:      reports that she has never smoked. She has never used smokeless tobacco. She reports that she does not currently use alcohol. She reports that she does not use drugs.    Review of Systems:  Review of Systems    Objective:     BP 135/73   Pulse 76   Temp 98 F (36.7 C) (Oral)   Resp 16   Ht 5' (1.524 m)   Wt 89.1 kg (196 lb 8 oz)   SpO2  98%   BMI 38.38 kg/m     Physical Exam:  Physical Exam  Vitals reviewed.   Eyes:      Extraocular Movements: Extraocular movements intact.      Conjunctiva/sclera: Conjunctivae normal.   Cardiovascular:      Rate and Rhythm: Normal rate.   Pulmonary:      Effort: Pulmonary effort is normal.   Musculoskeletal:      Cervical back: Normal range of motion.   Neurological:      Mental Status: She is alert.   Psychiatric:         Mood and Affect: Mood normal.         Behavior: Behavior normal.         Labs and Imaging:  Lab Results   Component Value Date    HGB 14.1 11/21/2021    SODIUM 141 11/21/2021    K 4.5 11/21/2021    CREAT 0.7 11/21/2021    GFR >60 11/21/2021    AST 23 11/21/2021    ALT 15 11/21/2021    TBILI 0.3 11/21/2021    A1C 5.3 11/21/2021    CHOL 145 08/22/2021    TRIG 133 08/22/2021         Assessment & Plan:     Danica was seen today for abdominal pain and low back pain.    Diagnoses and all orders for this visit:    Sleep difficulties  Chronic and currently using a high dose of melatonin and sleep aid as needed.  Discussed reducing the Melatonin to 1-2 mg per night, work on bedtime routine, and a sleep medicine work up.  Follow up as needed.  -     Consult/Referral to Sleep Medicine; Future  -     Melatonin 1 MG CAPS; Take 1 tablet by mouth nightly.    Acute left-sided low back pain without sciatica  Lower back pain present with abdominal pain during an between periods and improves slightly with low dose ibuprofen and heating pad.  Recommended increasing ibuprofen to 600 mg, 3 times a day, and follow up as needed.  If pain worsens or does not improve, consider physical therapy.    Dysmenorrhea  Chronic with recent history of abnormal  TVUS showing thickened endometrium and fibroids. Recommend following up with her gynecologist to follow up for endometrial biopsy and menstrual pain.        Routine Health Maintenance:  The 10-year ASCVD risk score (Arnett DK, et al., 2019) is: 0.9%    Values used to  calculate the score:      Age: 8 years      Sex: Female      Is Non-Hispanic African American: No      Diabetic: No      Tobacco smoker: No      Systolic Blood Pressure: 544 mmHg      Is BP treated: No      HDL Cholesterol: 34 MG/DL      Total Cholesterol: 145 MG/DL    Health Maintenance   Topic Date Due    Hepatitis C Screening  Never done    COVID-19 Vaccine (4 - Pfizer risk series) 01/30/2021    PHQ9 Depression Monitoring ([place])  02/20/2022    Breast Cancer Screen  06/25/2022    Pneumococcal Vaccine (2 - PCV) 03/29/2022    Influenza (1) 06/04/2022    Annual AUDIT Screen  10/22/2022    Annual DAST Screen  10/22/2022    Cervical Cancer Screening  10/05/2026    Tetanus (2 - Td or Tdap) 03/30/2031    Colorectal Cancer Screening  01/09/2032    Universal HIV Screening  Completed    Polio Vaccine  Aged Out    IMM_Hep A Vaccine Series  Aged Out    HPV Vaccine <= 48 Yrs  Aged Out    Meningococcal MCV4 Vaccine  Aged Out       Requested Prescriptions      No prescriptions requested or ordered in this encounter       Follow Up: No follow-ups on file.    Future Appointments   Date Time Provider [place]   03/27/2022 10:00 AM Crosby Oyster, MD [facility]   03/27/2022 11:40 AM Blenda Peals Hunt Oris, MD [facility]   10/06/2022 10:00 AM Jaquita Folds, MD [facility]

## 2022-03-26 NOTE — Progress Notes (Signed)
Department of Dermatology  [place]  PATIENT: Anita Turner  MRN: [id number]  DOB: [birth year]  DATE OF SERVICE: 03/27/2022    DERMATOLOGY FOLLOW-UP PATIENT VISIT     REFERRING PRACTITIONER: Crosby Oyster  PRIMARY CARE PROVIDER: Venetia Constable  CHIEF COMPLAINT: No chief complaint on file.    Subjective:     Anita Turner is a 51 year old female who was seen in clinic for f/u pemphigus vulgaris and pemphigus foliaceous. Patient message 03/03/2022 with photos of what appeared to be allergic contact dermatitis following hair coloring, prescribed triamcinolone.      Objective:   General: Pleasant, in no acute distress, appears stated age  Respiratory: No increased work of breathing    There were no vitals filed for this visit.    Skin Exam:  Skin Type: IV  Pertinent skin findings below:  Skin exam performed including face and oral mucosa     No oral or skin lesions today. ***     02/07/2019   Basement Membrane Zone (BMZ) IgG, IgG4, and IgA  IgG:  Negative, monkey esophagus substrate        Negative, human split skin substrate      IgG4:  Negative, monkey esophagus substrate             Negative, human split skin substrate   IgA:  Negative, monkey esophagus substrate        Negative, human split skin substrate   Positive IgG, including IgG4, cell surface antibodies,                monkey esophagus substrate      Enzyme Linked Immunosorbent Assay (ELISA)   --------------------------------------------------   Bullous Pemphigoid (BP) 180 and 230 IgG Antibodies   IgG BP 180 antibodies:  1 unit  NEGATIVE  IgG BP 230 antibodies:  1 unit  NEGATIVE     02/07/2019  Cell Surface IgG Antibodies   IgG:  Positive, titer 1:2560 (H), monkey esophagus substrate        Positive, titer 1:640 (H), intact human skin substrate      Enzyme-Linked Immunosorbent Assay (ELISA)  -----------------------------------------  Desmoglein (DSG) 1 and 3 IgG Antibodies   IgG desmoglein 1 antibodies:   7 units  NEGATIVE       Reference Range:           Positive (H) = Greater than 20 units          Borderline/Indeterminate = 14-20 units          Negative = Less than 14 units   IgG desmoglein 3 antibodies: 320 units (H)  POSITIVE         (Initial level, 139 units, greater than high                calibrator; diluted to achieve 64 units, within          assay calibrators, and multiplied by the dilution          factor of 5)   COMMENTS   Specific   -------------------------------------------------   These indirect immunofluorescence results, demonstrating   positive IgG cell surface antibody reactivity on both   monkey esophagus and intact human skin substrates, support   the diagnosis of pemphigus.    Assessment/Plan     # Pemphigus vulgaris  # Medication monitoring  Xerostomia resolved with resumption of cellcept.  Currently well-controlled with no flares since last visit.   Not on prednisone.  Patient previously followed by  Dr. Charm Barges and prior [facility] including serologies for pemphigus/pemphigoid, CBC, CMP. She was previously on rituxan. Was also on IVIg monthly but stopped in November 2021 due to difficulty tolerating infusions.      - Continue cellcept 1000 mg bid  - As well-controlled at this time, will hold off on adding additional therapies  - Have labs drawn today: CBC and CMP  - Continue B12 replacement   - Continue NCN 500 mg tid      # Recurrent HSV, >6 episodes per year  - Continue valtrex 500 mg daily as suppression         The above plan of care, diagnosis, orders, and follow-up were discussed with the patient. Risks, benefits, alternatives to therapy were discussed. Questions related to this recommended plan of care were answered.    RTC *** months or sooner prn    Scribe Attestation  The notes I am recording reflect only actions made by and judgments taken by this provider, Dr. Thornell Mule, for whom I am scribing today.  I have performed no independent clinical work.    [employer]    Provider Attestation for Scribed Note    As the attending  provider, I agree with the scribed content.  Any changes or edits are noted in the text above. I personally spent *** minutes of total face-to face and/or non-face-to-face time on the date of service.     Crosby Oyster

## 2022-03-27 ENCOUNTER — Encounter: Payer: Self-pay | Admitting: Dermatology

## 2022-03-27 ENCOUNTER — Ambulatory Visit: Payer: No Typology Code available for payment source | Admitting: Dermatology

## 2022-03-27 ENCOUNTER — Ambulatory Visit (INDEPENDENT_AMBULATORY_CARE_PROVIDER_SITE_OTHER): Payer: No Typology Code available for payment source | Admitting: Internal Medicine

## 2022-03-27 VITALS — BP 135/73 | HR 76 | Temp 98.0°F | Resp 16 | Ht 60.0 in | Wt 196.5 lb

## 2022-03-27 DIAGNOSIS — G479 Sleep disorder, unspecified: Secondary | ICD-10-CM

## 2022-03-27 DIAGNOSIS — M545 Low back pain, unspecified: Secondary | ICD-10-CM

## 2022-03-27 DIAGNOSIS — B009 Herpesviral infection, unspecified: Secondary | ICD-10-CM

## 2022-03-27 DIAGNOSIS — L100 Pemphigus vulgaris: Secondary | ICD-10-CM

## 2022-03-27 DIAGNOSIS — N946 Dysmenorrhea, unspecified: Secondary | ICD-10-CM

## 2022-03-27 DIAGNOSIS — Z79899 Other long term (current) drug therapy: Secondary | ICD-10-CM

## 2022-03-27 MED ORDER — MEFENAMIC ACID 250 MG OR CAPS
ORAL_CAPSULE | ORAL | Status: DC
Start: 2022-03-22 — End: 2022-04-11

## 2022-03-27 MED ORDER — MELATONIN 1 MG OR CAPS
1.0000 | ORAL_CAPSULE | Freq: Every evening | ORAL | 0 refills | Status: DC
Start: 2022-03-27 — End: 2022-11-05

## 2022-03-27 NOTE — Interdisciplinary (Signed)
Patient is here today c/o of lower abdominal and back pain (left side).    Pt declined Covid Vaccine.

## 2022-04-08 ENCOUNTER — Telehealth: Payer: Self-pay | Admitting: Dermatology

## 2022-04-08 NOTE — Telephone Encounter (Signed)
LM TO SCHEDULE CONSULT FOR [facility] TO [facility]

## 2022-04-09 ENCOUNTER — Ambulatory Visit: Payer: Commercial Managed Care - PPO | Admitting: Internal Medicine

## 2022-04-10 NOTE — Patient Instructions (Signed)
It was nice to see you in my clinic today. Thank you for allowing me to take part in your care. If I may be of any further assistance, please do not hesitate to contact [phone]. If you have not done so already, please contact the front desk on your way out for information on secure health messaging to communicate with me electronically. For your health, it is important that you have the following done in a timely manner.    # Please schedule an appointment with Dr. Jolyn Lent or your regular Primary Care Provider at the front desk as needed. If you have any new or emergent concerns, please seek medical attention immediately or call 911. Please arrive to your appointment at least 20 minutes prior in order to check in and fill out paperwork.    # As we discussed, please work on diet and exercise as tolerated.     # You were given a referral to PT. If you do not hear about your referral within two weeks, please call [phone]    # You were given a lab slip to have your blood tests done. Please have them done fasting.    # Your medications were sent to your pharmacy.    # You will be receiving a call and email regarding a survey on the service you received today. Please take the one-minute survey to help [place] understand your experience and to continue providing you with great service in the future.    Sincerely,    Jolyn Lent, MD  [place], [place]

## 2022-04-10 NOTE — Progress Notes (Unsigned)
Patient: Anita Turner  DOB: 1971/07/20  Primary Care Provider: Venetia Constable  BRIEF VISIT/FOLLOW UP-ADULT    Subjective:      Anita Turner is a 51 year old Not Hispanic, Latino(a), or Spanish origin female who presents today for Follow Up (Back pain)      Current Concerns/Interval History:    82/24/2023:   #Back pain:  Notes that she has abdominal and back pain with her periods.  It resolves with her periods.  The abdominal cramping is normal for her during her periods, but she is also having it between her periods.  She is concerned because she has a history of kidney stones.  She had her IUD removed about one month ago.  Recently had ultrasound that showed thickened endometrium and fibroids. Has appointment for endometrial biopsy.  She has talked with her gyne about possible   Colonoscopy was done recently and negative either than for hemorrhoids.  The back pain pain is usually constant, dull, in her lower back.  She is taking Motrin 200 mg in morning and 200 at night.    Acute left-sided low back pain without sciatica  Lower back pain present with abdominal pain during an between periods and improves slightly with low dose ibuprofen and heating pad.  Recommended increasing ibuprofen to 600 mg, 3 times a day, and follow up as needed.  If pain worsens or does not improve, consider physical therapy.    Today:  She notes that the pain has improved but worse with the beginning of her periods.  Has been given Menamic Acid in the past for menstural cramps and works well, would like a refill today.      #Sleep difficulties:  Notes that she decreased her melatonin as discussed and has improvement.    #Hair loss, body aches, fatigue:  New symptoms  Not sure if is premenopausal or thyroid    HCM:  '@PHQ9'$ @      Past Medical History:  Patient Active Problem List   Diagnosis    Pemphigus vulgaris    Pemphigus foliaceus    Abnormal EKG    Class 2 severe obesity due to excess calories with serious comorbidity and body  mass index (BMI) of 38.0 to 38.9 in adult (CMS-HCC)    Osteoarthritis of knee    Prediabetes    Nephrolithiasis    Vitreomacular adhesion of both eyes    Age-related nuclear cataract of both eyes    Dyslipidemia    Fatty liver    Personal history of COVID-19-July 2022    B12 deficiency    Female stress incontinence    Dry mouth    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)    Other specified congenital anomaly of skin    Current use of steroid medication    Recurrent HSV (herpes simplex virus)    Papanicolaou smear of cervix with low grade squamous intraepithelial lesion (LGSIL)    Anxiety and depression       Past Surgical History:  Past Surgical History:   Procedure Laterality Date    CHOLECYSTECTOMY  2017    APPENDECTOMY  2002       Medications:    Current Outpatient Medications:     ALPRAZolam (XANAX) 0.5 MG tablet, Take 1 tablet (0.5 mg) by mouth nightly as needed for Insomnia., Disp: 30 tablet, Rfl: 2    buPROPion (WELLBUTRIN XL) 150 MG XL tablet, Take 1 tablet (150 mg) by mouth every morning., Disp: 90 tablet, Rfl: 3  hyoscyamine (ANASPAZ) 0.125 MG tablet, Take 1 tablet (0.125 mg) by mouth every 4 hours as needed for Cramping., Disp: 120 tablet, Rfl: 0    Mefenamic Acid 250 MG CAPS, , Disp: , Rfl:     Melatonin 1 MG CAPS, Take 1 tablet by mouth nightly., Disp: 90 capsule, Rfl: 0    Multiple Vitamin (MULTIVITAMIN) capsule, Take 1 capsule by mouth daily., Disp: 30 tablet, Rfl: 0    mycoPHENOLate mofetil (CELLCEPT) 500 MG tablet, Take 2 tablets (1,000 mg) by mouth 2 times daily. (Patient taking differently: Take 2 tablets (1,000 mg) by mouth daily.), Disp: 360 tablet, Rfl: 3    nystatin-triamcinolone (MYCOGEN II) 100000-0.1 UNIT/GM-% cream, Apply 1 Application topically 2 times daily as needed (intertrigo in armpit). For rash under arms, Disp: 60 g, Rfl: 3    triamcinolone (KENALOG) 0.1 % cream, Apply 1 Application. topically 2 times daily. As needed for rash on neck and chest, Disp: 80 g, Rfl: 1    valACYclovir  (VALTREX) 500 MG tablet, Take 1 tablet (500 mg) by mouth daily., Disp: 90 tablet, Rfl: 3    Allergies:  No Known Allergies    FAMILY HISTORY:  Family History   Problem Relation Name Age of Onset    Diabetes Mother          HTN, DJD    Neurological Disease (Parkinsons/Huntingtons/Huntingtons Chorea) Father          DM, alcoholism    Diabetes Father      No Known Problems Sister      Thyroid Brother      Cataract Maternal Grandfather      Cataract Paternal Grandfather         SOCIAL HISTORY:      reports that she has never smoked. She has never used smokeless tobacco. She reports that she does not currently use alcohol. She reports that she does not use drugs.    Review of Systems:  Review of Systems    Objective:     BP 109/61   Pulse 81   Temp 98.6 F (37 C) (Oral)   Resp 16   Ht 5' (1.524 m)   Wt 89.4 kg (197 lb)   SpO2 99%   BMI 38.47 kg/m     Physical Exam:  Physical Exam    Labs and Imaging:  Lab Results   Component Value Date    HGB 14.1 11/21/2021    SODIUM 141 11/21/2021    K 4.5 11/21/2021    CREAT 0.7 11/21/2021    GFR >60 11/21/2021    AST 23 11/21/2021    ALT 15 11/21/2021    TBILI 0.3 11/21/2021    A1C 5.3 11/21/2021    CHOL 145 08/22/2021    TRIG 133 08/22/2021         Assessment & Plan:     There are no diagnoses linked to this encounter.        Routine Health Maintenance:  The 10-year ASCVD risk score (Arnett DK, et al., 2019) is: 1%    Values used to calculate the score:      Age: 59 years      Sex: Female      Is Non-Hispanic African American: No      Diabetic: No      Tobacco smoker: No      Systolic Blood Pressure: 250 mmHg      Is BP treated: No      HDL Cholesterol: 34 MG/DL  Total Cholesterol: 145 MG/DL    Health Maintenance   Topic Date Due    Hepatitis C Screening  Never done    COVID-19 Vaccine (4 - Pfizer risk series) 01/30/2021    Pneumococcal Vaccine (2 - PCV) 03/29/2022    Breast Cancer Screen  06/25/2022    Influenza (1) 06/04/2022    PHQ9 Depression Monitoring ([place])   06/27/2022    Annual AUDIT Screen  10/22/2022    Annual DAST Screen  10/22/2022    Cervical Cancer Screening  10/05/2026    Tetanus (2 - Td or Tdap) 03/30/2031    Colorectal Cancer Screening  01/09/2032    Universal HIV Screening  Completed    Polio Vaccine  Aged Out    IMM_Hep A Vaccine Series  Aged Out    HPV Vaccine <= 40 Yrs  Aged Out    Meningococcal MCV4 Vaccine  Aged Out       Requested Prescriptions      No prescriptions requested or ordered in this encounter       Follow Up: No follow-ups on file.    Future Appointments   Date Time Provider [place]   04/11/2022 11:40 AM Blenda Peals, Hunt Oris, MD [facility] [place]   05/19/2022 10:30 AM Deborah Chalk, ND [facility]   07/25/2022 11:00 AM Estill Batten, MD [facility] [place]   08/21/2022 10:20 AM Crosby Oyster, MD [place] [facility] [place]   10/06/2022 10:00 AM Jaquita Folds, MD [facility]

## 2022-04-11 ENCOUNTER — Ambulatory Visit: Payer: No Typology Code available for payment source | Admitting: Internal Medicine

## 2022-04-11 ENCOUNTER — Encounter: Payer: Self-pay | Admitting: Internal Medicine

## 2022-04-11 VITALS — BP 109/61 | HR 81 | Temp 98.6°F | Resp 16 | Ht 60.0 in | Wt 197.0 lb

## 2022-04-11 DIAGNOSIS — E786 Lipoprotein deficiency: Secondary | ICD-10-CM

## 2022-04-11 DIAGNOSIS — G9332 Myalgic encephalomyelitis/chronic fatigue syndrome: Secondary | ICD-10-CM

## 2022-04-11 DIAGNOSIS — Z87898 Personal history of other specified conditions: Secondary | ICD-10-CM

## 2022-04-11 DIAGNOSIS — N946 Dysmenorrhea, unspecified: Secondary | ICD-10-CM

## 2022-04-11 DIAGNOSIS — G479 Sleep disorder, unspecified: Secondary | ICD-10-CM

## 2022-04-11 DIAGNOSIS — R52 Pain, unspecified: Secondary | ICD-10-CM

## 2022-04-11 DIAGNOSIS — M545 Low back pain, unspecified: Secondary | ICD-10-CM

## 2022-04-11 MED ORDER — MEFENAMIC ACID 250 MG OR CAPS
250.0000 mg | ORAL_CAPSULE | ORAL | 0 refills | Status: DC | PRN
Start: 2022-04-11 — End: 2022-11-05

## 2022-04-11 NOTE — Interdisciplinary (Unsigned)
Patient is here today for follow up on back pain.    Requesting labs to recheck vitamin D and A1C.      Pt declined vaccines for today's visit.

## 2022-04-12 ENCOUNTER — Ambulatory Visit: Payer: No Typology Code available for payment source | Admitting: Nurse Practitioner

## 2022-04-12 ENCOUNTER — Inpatient Hospital Stay (INDEPENDENT_AMBULATORY_CARE_PROVIDER_SITE_OTHER)
Admit: 2022-04-12 | Discharge: 2022-04-12 | Disposition: A | Discharge status: Home Health | Payer: No Typology Code available for payment source

## 2022-04-12 VITALS — BP 131/81 | HR 92 | Resp 16 | Ht 60.0 in | Wt 197.0 lb

## 2022-04-12 DIAGNOSIS — M48061 Spinal stenosis, lumbar region without neurogenic claudication: Secondary | ICD-10-CM

## 2022-04-12 DIAGNOSIS — M4727 Other spondylosis with radiculopathy, lumbosacral region: Secondary | ICD-10-CM

## 2022-04-12 DIAGNOSIS — M62838 Other muscle spasm: Secondary | ICD-10-CM

## 2022-04-12 DIAGNOSIS — M5442 Lumbago with sciatica, left side: Secondary | ICD-10-CM

## 2022-04-12 DIAGNOSIS — M4726 Other spondylosis with radiculopathy, lumbar region: Secondary | ICD-10-CM

## 2022-04-12 MED ORDER — KETOROLAC TROMETHAMINE 30 MG/ML IJ SOLN
30.0000 mg | Freq: Once | INTRAMUSCULAR | Status: AC
Start: 2022-04-12 — End: 2022-04-12
  Administered 2022-04-12: 30 mg via INTRAMUSCULAR

## 2022-04-12 MED ORDER — BACLOFEN 10 MG OR TABS
10.0000 mg | ORAL_TABLET | Freq: Three times a day (TID) | ORAL | 0 refills | Status: DC | PRN
Start: 2022-04-12 — End: 2022-12-25

## 2022-04-12 NOTE — H&P (Cosign Needed)
Anita Turner is a 51 year old year old female is here for   Back Pain    Encounter Type: Brief Visit    Chief Complaint:  51 year old female presents to clinic c/o lower back pain. The pain began spontaneously, without AMOI. No recent falls or traumas. The pain began 2 days ago. Located in left lower back.     Characteristic: constant and tight   Aggravating factors: movement  Relieving factors: rest  Treatments tried: motrin, heating pad, lower back exercises.     No hx of surgery to lower back.   No hx of previous fracture to lower back.     Next LMP is due 04/20/22.     No fevers, chills, body aches, shortness of breath, chest pain, heart palpitations,  nausea, vomiting, diarrhea, constipation, or abdominal pain, or other s/s of systemic infection.    No saddle anesthesia. No radiating numbness or tingling. No dysuria or hematuria. No change in bowel or bladder continence.     Review of Systems:  All systems were reviewed and were negative or stable except for left lower back pain.     Allergies  No Known Allergies    OBJECTIVE:    Vital Signs: BP 131/81 (BP Location: Left arm, BP Patient Position: Sitting, BP cuff size: Regular)   Pulse 92   Resp 16   Ht 5' (1.524 m)   Wt 89.4 kg (197 lb)   SpO2 98%   BMI 38.47 kg/m     Objective Notes:     Constitutional: Well developed, well nourished, alert and oriented x4, in no acute distress.  HEENT: Normal cephalic and atraumatic.  Eyes: PERL, The sclera are white and conjunctiva are pink and moist.  Neck: supple with trachea at midline and thyroid without enlargement or nodules.  Pulmonary: Respirations are regular, even, quiet and non-labored. Pt is speaking in full sentences.    Gastrointestinal: Soft; nondistended; nontender; no hepatosplenomegaly; no masses, no CVAT.  Musculoskeletal: Hypertonicity and guarding of the left QL and lumbar paraspinals. Positive seated SLR on left only. No ttp of the thoracic, lumbar or sacral vertebra. Limited AROM of lumbar  spine d/t pain.   Skin: Appears smooth, warm, and skin turgor within normal limits.   Extremities: There is no clubbing, cyanosis, or edema, and there is normal range of motion and sensation.  Neurological: Cranial nerves 2-12 intact, sensory intact, motor 5/5, bilaterally, and no focal deficits  Psychiatric: The mood is euthymic and the affect is normal appearance.    Assessment:    ICD-10-CM ICD-9-CM    1. Acute left-sided low back pain with left-sided sciatica  M54.42 724.2      724.3       2. Muscle spasm  M62.838 728.85           I attest that I have advised the patient that I am not a physician or surgeon and that I am a nurse practitioner. I have also advised the patient that they may request and will be allowed to see a physician. In addition, I have advised the patient that in the event of any change in their condition outside of my education and training, they will be referred to a physician.      Answered all pt's questions to pt's satisfaction.    Pt verbalizes understanding and agrees to plan of care.    I do recommend pt follow up with physical therapy. D/t [facility] policy unable to  provide referrals. Pt to f/u with PMD for further E/M and tx.     Follow Up:  Please follow up with PMD within 7 days or sooner as needed.   Return to clinic or ER if symptoms are worsening or for chest pain, shortness of breath, difficulty breathing, confusion, loss  of consciousness, fevers.    Labs/Diagnositics: X-Ray lumbar spine    Medications:  -May use Tylenol 1,000 mg by mouth every 8 hours as needed for pain or fever.   -May use Ibuprofen 600 mg by mouth every 6-8 hours as needed for pain, fever, or inflammation. Please do not take anti-inflammatory medication on an empty stomach. Please do not combine different anti-inflammatory medications as this can increase risk of adverse effects/events.  +++As an alternative to oral ibuprofen, consider topical Voltaren gel as needed for pain or inflammation.    -May use Baclofen 1 pill by mouth every 8 hours only as needed for muscle spasm. Please do not use this medication prior to driving, operating heavy machinery or with alcohol.   -Today in clinic you received toradol 30 mg IM x 1 dose.     Consults/Referrals: none     Procedures: none    Recommendations:  -Please follow RICE (Rest Ice Compression Elevation) Treatment. Rest, Ice and Antiinflammatory meds as directed, Confine/  Wrap affected joint and elevate affected area.  -Multivitamins  -Increase fluids  -Maintain hand hygiene  -Avoid sugary foods  -Increase rest  -Please consider, massage, acupuncture or chiropractic care.  -Gentle range of motion.  -Consider diaphragmatic breathing

## 2022-04-12 NOTE — Interdisciplinary (Signed)
Anita Turner is a 51 year old female is here for lower back pain, pt was seen yesterday in clinic for the same problem and was told to take ibuprofen and hot packs and she stated it does not help.

## 2022-04-14 ENCOUNTER — Encounter: Payer: Self-pay | Admitting: Internal Medicine

## 2022-04-15 ENCOUNTER — Encounter: Payer: Self-pay | Admitting: Internal Medicine

## 2022-04-18 NOTE — Result Encounter Note (Signed)
No acute fractures or dislocation of the lumbar spine. There is degenerative changes (wear and tear) of the lumbar spine. Pt to follow up with PMD for further evaluation and management.

## 2022-04-23 ENCOUNTER — Other Ambulatory Visit
Admission: RE | Admit: 2022-04-23 | Discharge: 2022-04-23 | Disposition: A | Discharge status: Home / Self Care | Payer: No Typology Code available for payment source | Attending: Internal Medicine | Admitting: Internal Medicine

## 2022-04-23 DIAGNOSIS — M545 Low back pain, unspecified: Secondary | ICD-10-CM | POA: Insufficient documentation

## 2022-04-23 DIAGNOSIS — Z87898 Personal history of other specified conditions: Secondary | ICD-10-CM | POA: Insufficient documentation

## 2022-04-23 DIAGNOSIS — E786 Lipoprotein deficiency: Secondary | ICD-10-CM | POA: Insufficient documentation

## 2022-04-23 DIAGNOSIS — R52 Pain, unspecified: Secondary | ICD-10-CM | POA: Insufficient documentation

## 2022-04-23 DIAGNOSIS — G9332 Myalgic encephalomyelitis/chronic fatigue syndrome: Secondary | ICD-10-CM | POA: Insufficient documentation

## 2022-04-23 LAB — LIPID(CHOL FRACT) PANEL, BLOOD
Cholesterol: 170 MG/DL (ref <200)
HDL Cholesterol: 38 MG/DL — ABNORMAL LOW (ref >40)
LDL Cholesterol (calc): 80 MG/DL (ref <160)
Non HDL Cholesterol (calculated): 132 MG/DL — ABNORMAL HIGH (ref <130)
Triglycerides: 261 MG/DL — ABNORMAL HIGH (ref <150)
VLDL Cholesterol (calculated): 52 MG/DL

## 2022-04-23 LAB — VITAMIN D, 25-OH TOTAL: Vitamin D, 25-Hydroxy, Total: 32.3 ng/mL (ref >30.0)

## 2022-04-23 LAB — COMPREHENSIVE METABOLIC PANEL, BLOOD
ALT: 13 U/L (ref 7–52)
AST: 19 U/L (ref 13–39)
Albumin: 3.9 G/DL (ref 3.7–5.3)
Alk Phos: 90 U/L (ref 34–104)
BUN: 8 mg/dL (ref 7–25)
Bilirubin, Total: 0.3 mg/dL (ref 0.0–1.4)
CO2: 26 mmol/L (ref 21–31)
Calcium: 9.2 mg/dL (ref 8.6–10.3)
Chloride: 107 mmol/L (ref 98–107)
Creat: 0.7 mg/dL (ref 0.6–1.2)
Electrolyte Balance: 7 mmol/L (ref 2–12)
Glucose: 102 mg/dL (ref 70–115)
Potassium: 4.7 mmol/L (ref 3.5–5.1)
Protein, Total: 6.4 G/DL (ref 6.0–8.3)
Sodium: 140 mmol/L (ref 136–145)
eGFR - high estimate: >60 (ref >59)
eGFR - low estimate: >60 (ref >59)

## 2022-04-23 LAB — CBC WITH DIFF, BLOOD
Basophils %: 1 %
Basophils Absolute: 0.1 10*3/uL (ref 0.0–0.2)
Eosinophils %: 4.8 %
Eosinophils Absolute: 0.5 10*3/uL (ref 0.0–0.5)
Hematocrit: 39.6 % (ref 34.0–44.0)
Hgb: 13 G/DL (ref 11.5–15.0)
Lymphocytes %.: 33 %
Lymphocytes Absolute: 3.5 10*3/uL — ABNORMAL HIGH (ref 0.9–3.3)
MCH: 29.1 PG (ref 27.0–33.5)
MCHC: 32.9 G/DL (ref 32.0–35.5)
MCV: 88.3 FL (ref 81.5–97.0)
MPV: 9.3 FL (ref 7.2–11.7)
Monocytes %: 7.8 %
Monocytes Absolute: 0.8 10*3/uL (ref 0.0–0.8)
PLT Count: 399 10*3/uL (ref 150–400)
Platelet Morphology: NORMAL
RBC Morphology: NORMAL
RBC: 4.48 10*6/uL (ref 3.70–5.00)
RDW-CV: 13.8 % (ref 11.6–14.4)
Seg Neutro % (M): 53.4 %
Seg Neutro Abs (M): 5.6 10*3/uL (ref 2.0–8.1)
White Bld Cell Count: 10.5 10*3/uL (ref 4.0–10.5)

## 2022-04-23 LAB — URINALYSIS WITH CULTURE REFLEX, WHEN INDICATED
Bilirubin, UA: NEGATIVE
Glucose, UA: NEGATIVE MG/DL
Ketones, UA: NEGATIVE MG/DL
Nitrite, UA: NEGATIVE
Protein, UA: NEGATIVE MG/DL
RBC, UA: 82 #/HPF — ABNORMAL HIGH (ref 0–3)
Specific Grav, UA: 1.019 (ref 1.003–1.030)
Squamous Epithelial, UA: 1 /HPF (ref 0–10)
UA Cult: NEGATIVE
Urobilinogen, UA: <2 MG/DL (ref <2)
WBC, UA: 5 #/HPF (ref 0–5)
pH, UA: 6.5 (ref 5.0–8.0)

## 2022-04-23 LAB — VITAMIN B12, BLOOD: Vitamin B12 Lvl: 253 PG/ML (ref 180–1241)

## 2022-04-23 LAB — TSH, BLOOD: TSH, Ultrasensitive: 2.199 u[IU]/mL (ref 0.450–4.120)

## 2022-04-23 LAB — SED RATE, BLOOD: Sedimentation Rate: 12 MM/HR (ref 0–20)

## 2022-04-23 NOTE — Addendum Note (Signed)
Addended by: Cleotis Lema on: 04/23/2022 11:53 AM     Modules accepted: Orders

## 2022-04-24 ENCOUNTER — Encounter: Payer: Self-pay | Admitting: Internal Medicine

## 2022-04-24 LAB — GLYCOSYLATED HGB(A1C), BLOOD: Glycated Hgb, A1C: 5.4 % (ref 4.6–5.6)

## 2022-04-25 NOTE — Telephone Encounter (Signed)
Called Patient and left a message asking patient to call back to schedule an appointment.     Please schedule patient back for an appointment.

## 2022-04-28 NOTE — Telephone Encounter (Signed)
My chart message sent to patient asking to contact our office to schedule an appointment.

## 2022-04-29 ENCOUNTER — Encounter: Payer: Self-pay | Admitting: Internal Medicine

## 2022-04-29 ENCOUNTER — Ambulatory Visit: Payer: No Typology Code available for payment source | Admitting: Internal Medicine

## 2022-04-29 VITALS — BP 135/70 | HR 74 | Temp 98.4°F | Resp 18 | Ht 60.0 in | Wt 201.2 lb

## 2022-04-29 DIAGNOSIS — Z0000 Encounter for general adult medical examination without abnormal findings: Secondary | ICD-10-CM

## 2022-04-29 DIAGNOSIS — Z1159 Encounter for screening for other viral diseases: Secondary | ICD-10-CM

## 2022-04-29 DIAGNOSIS — M545 Low back pain, unspecified: Secondary | ICD-10-CM

## 2022-04-29 DIAGNOSIS — R8290 Unspecified abnormal findings in urine: Secondary | ICD-10-CM

## 2022-04-29 DIAGNOSIS — E786 Lipoprotein deficiency: Secondary | ICD-10-CM

## 2022-04-29 DIAGNOSIS — E782 Mixed hyperlipidemia: Secondary | ICD-10-CM

## 2022-04-29 NOTE — Patient Instructions (Signed)
It was nice to see you in my clinic today. Thank you for allowing me to take part in your care. If I may be of any further assistance, please do not hesitate to contact [phone]. If you have not done so already, please contact the front desk on your way out for information on secure health messaging to communicate with me electronically. For your health, it is important that you have the following done in a timely manner.    # Please schedule an appointment with Dr. Jolyn Lent or your regular Primary Care Provider at the front desk as needed. If you have any new or emergent concerns, please seek medical attention immediately or call 911. Please arrive to your appointment at least 20 minutes prior in order to check in and fill out paperwork.    # As we discussed, please work on diet and exercise as tolerated.     # You were given a lab slip to have your blood tests done. Please have them done today for your repeat urine and hepatitis C screening, and in 3 months for your repeat cholesterol..    # Your medication recommendations:  -Daily omega3FA supplement  -Daily Vitamin D3, [id number] international units     # You will be receiving a call and email regarding a survey on the service you received today. Please take the one-minute survey to help [place] understand your experience and to continue providing you with great service in the future.    Sincerely,    Jolyn Lent, MD  [place], [place]

## 2022-04-29 NOTE — Progress Notes (Signed)
Patient: Anita Turner  DOB: 1970-12-17  Primary Care Provider: Venetia Constable  Follow up-Labs    Subjective:      Lucill Mauck is a 51 year old Not Hispanic, Latino(a), or Spanish origin female who presents today for Follow Up (Labs)      Current Concerns/Interval History:     Patient evaluated on 04/11/2022 for an annual physical exam with labs and is here today to follow up on those labs and to discuss recommendations.    #Labs:  CMP: Within normal range  Lipid: Elevated Tg to 261, HDL low at 38 and non HDL elevated to 132  TSH: Within normal range  Vit D: Within normal range  A1c: Within normal range  UA: large hemoglobin, trace leuk esterace, 82 RBC, few mucous  CBC: Within normal range    #HDL:  History of HDL: yes  Family history of CAD: yes  Diet and activity: not at goal  The 10-year ASCVD risk score (Arnett DK, et al., 2019) is: 1.6%    Values used to calculate the score:      Age: 69 years      Sex: Female      Is Non-Hispanic African American: No      Diabetic: No      Tobacco smoker: No      Systolic Blood Pressure: 174 mmHg      Is BP treated: No      HDL Cholesterol: 38 MG/DL      Total Cholesterol: 170 MG/DL    #Abnormal Urinalysis:  Symptoms: Denies dysuria, hematuria, pelvic pain/craming, nausea, vomiting, diarrhea, flank pain, fever  History of abnormal urinalysis: none  History of renal or bladder disease: none  Family history of renal or bladder disease: none  Smoking status: nonsmoker    #lower back pain:  Patient previously evaluated and PT was recommended.  She would like to get an external referral to try getting in sooner for sessions.      HCM:  Flu: due and desires at a nurse visit in the future      Past Medical History:  Patient Active Problem List   Diagnosis    Pemphigus vulgaris    Pemphigus foliaceus    Abnormal EKG    Class 2 severe obesity due to excess calories with serious comorbidity and body mass index (BMI) of 38.0 to 38.9 in adult (CMS-HCC)    Osteoarthritis of knee     Prediabetes    Nephrolithiasis    Vitreomacular adhesion of both eyes    Age-related nuclear cataract of both eyes    Dyslipidemia    Fatty liver    Personal history of COVID-19-July 2022    B12 deficiency    Female stress incontinence    Dry mouth    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)    Other specified congenital anomaly of skin    Current use of steroid medication    Recurrent HSV (herpes simplex virus)    Papanicolaou smear of cervix with low grade squamous intraepithelial lesion (LGSIL)    Anxiety and depression    Mixed hyperlipidemia    Low HDL (under 40)       Past Surgical History:  Past Surgical History:   Procedure Laterality Date    CHOLECYSTECTOMY  2017    APPENDECTOMY  2002       Medications:    Current Outpatient Medications:     ALPRAZolam (XANAX) 0.5 MG tablet, Take 1 tablet (0.5 mg) by mouth  nightly as needed for Insomnia., Disp: 30 tablet, Rfl: 2    baclofen (LIORESAL) 10 MG tablet, Take 1 tablet (10 mg) by mouth every 8 hours as needed (muscle spasm)., Disp: 30 tablet, Rfl: 0    buPROPion (WELLBUTRIN XL) 150 MG XL tablet, Take 1 tablet (150 mg) by mouth every morning., Disp: 90 tablet, Rfl: 3    Mefenamic Acid 250 MG CAPS, Take 250 mg by mouth as needed (Menstrual cramps). Take 500 on day one then 250 daily. not to exceed one week duration at a time, Disp: 28 capsule, Rfl: 0    Melatonin 1 MG CAPS, Take 1 tablet by mouth nightly., Disp: 90 capsule, Rfl: 0    Multiple Vitamin (MULTIVITAMIN) capsule, Take 1 capsule by mouth daily., Disp: 30 tablet, Rfl: 0    mycoPHENOLate mofetil (CELLCEPT) 500 MG tablet, Take 2 tablets (1,000 mg) by mouth 2 times daily. (Patient taking differently: Take 2 tablets (1,000 mg) by mouth daily.), Disp: 360 tablet, Rfl: 3    valACYclovir (VALTREX) 500 MG tablet, Take 1 tablet (500 mg) by mouth daily., Disp: 90 tablet, Rfl: 3    Allergies:  No Known Allergies    FAMILY HISTORY:  Family History   Problem Relation Name Age of Onset    Diabetes Mother          HTN, DJD     Neurological Disease (Parkinsons/Huntingtons/Huntingtons Chorea) Father          DM, alcoholism    Diabetes Father      No Known Problems Sister      Thyroid Brother      Cataract Maternal Grandfather      Cataract Paternal Grandfather         SOCIAL HISTORY:      reports that she has never smoked. She has never used smokeless tobacco. She reports that she does not currently use alcohol. She reports that she does not use drugs.    Review of Systems:  Review of Systems    Objective:     BP 135/70   Pulse 74   Temp 98.4 F (36.9 C) (Oral)   Resp 18   Ht 5' (1.524 m)   Wt 91.3 kg (201 lb 3 oz)   SpO2 99%   BMI 39.29 kg/m     Physical Exam:  Physical Exam  Constitutional:       Appearance: Normal appearance.   Eyes:      Extraocular Movements: Extraocular movements intact.      Conjunctiva/sclera: Conjunctivae normal.   Cardiovascular:      Rate and Rhythm: Normal rate and regular rhythm.   Pulmonary:      Effort: Pulmonary effort is normal.   Neurological:      Mental Status: She is alert.   Psychiatric:         Mood and Affect: Mood normal.         Behavior: Behavior normal.         Labs and Imaging:  Lab Results   Component Value Date    HGB 13.0 04/23/2022    SODIUM 140 04/23/2022    K 4.7 04/23/2022    CREAT 0.7 04/23/2022    GFR >60 04/23/2022    AST 19 04/23/2022    ALT 13 04/23/2022    TBILI 0.3 04/23/2022    A1C 5.4 04/23/2022    CHOL 170 04/23/2022    TRIG 261 (H) 04/23/2022         Assessment & Plan:  Bodhi was seen today for follow up.    Diagnoses and all orders for this visit:    Health care maintenance  Reviewed all of labs completed for last annual physical exam.  Answered all of patient's questions.  Discussed repeat labs and abnormal labs, see below.  -     Flu vaccine (6 months +); Future    Mixed hyperlipidemia  Chronically diagnosed, diet and activity not at goal, recommended lifestyle modifications, no medications and repeat labs in 3 months  -     Lipid Panel Green Plasma  Separator Tube; Future    Low HDL (under 40)  -     Lipid Panel Green Plasma Separator Tube; Future    Abnormal urinalysis  Asymptomatic, was menstruating during lab, repeat lab when convenient and follow up as needed.  -     Urinalysis with Culture Reflex, when indicated; Future    Need for hepatitis C screening test  -     Hepatitis C Antibody; Future    Acute left-sided low back pain without sciatica  Stable, will place PT external referral and follow up as needed.  -     External Physical Therapy; Future            Routine Health Maintenance:  The 10-year ASCVD risk score (Arnett DK, et al., 2019) is: 1.6%    Values used to calculate the score:      Age: 82 years      Sex: Female      Is Non-Hispanic African American: No      Diabetic: No      Tobacco smoker: No      Systolic Blood Pressure: 427 mmHg      Is BP treated: No      HDL Cholesterol: 38 MG/DL      Total Cholesterol: 170 MG/DL    Health Maintenance   Topic Date Due    Hepatitis C Screening  Never done    COVID-19 Vaccine (4 - Pfizer risk series) 01/30/2021    Influenza (1) 03/04/2022    Pneumococcal Vaccine (2 - PCV) 03/29/2022    Breast Cancer Screen  06/25/2022    PHQ9 Depression Monitoring ([place])  06/27/2022    Annual AUDIT Screen  10/22/2022    Annual DAST Screen  10/22/2022    Cervical Cancer Screening  10/05/2026    Tetanus (2 - Td or Tdap) 03/30/2031    Colorectal Cancer Screening  01/09/2032    Universal HIV Screening  Completed    Polio Vaccine  Aged Out    IMM_Hep A Vaccine Series  Aged Out    HPV Vaccine <= 35 Yrs  Aged Out    Meningococcal MCV4 Vaccine  Aged Out       Requested Prescriptions      No prescriptions requested or ordered in this encounter       Follow Up: No follow-ups on file.    Future Appointments   Date Time Provider [place]   04/29/2022  3:40 PM Blenda Peals, Hunt Oris, MD [facility] [place]   05/19/2022 10:30 AM Deborah Chalk, ND [facility] [place]   06/06/2022 10:30 AM Lenell Antu, PT [facility]  [place]   06/20/2022 11:30 AM Lenell Antu, PT [place] [facility] [place]   07/02/2022  1:15 PM Lenell Antu, PT [place] [facility] [place]   07/25/2022 11:00 AM Estill Batten, MD [facility] [place]   08/21/2022 10:20 AM Crosby Oyster, MD [place] [facility] [place]   10/06/2022 10:00  AM Jaquita Folds, MD [place] [facility]

## 2022-04-29 NOTE — Interdisciplinary (Signed)
Patient is here today for follow up on labs.    Pt declined vaccines.

## 2022-05-05 ENCOUNTER — Encounter: Payer: Self-pay | Admitting: Family Practice

## 2022-05-05 ENCOUNTER — Other Ambulatory Visit
Admission: RE | Admit: 2022-05-05 | Discharge: 2022-05-05 | Disposition: A | Discharge status: Home / Self Care | Payer: No Typology Code available for payment source | Attending: Internal Medicine | Admitting: Internal Medicine

## 2022-05-05 DIAGNOSIS — R8290 Unspecified abnormal findings in urine: Secondary | ICD-10-CM | POA: Insufficient documentation

## 2022-05-05 LAB — URINALYSIS WITH CULTURE REFLEX, WHEN INDICATED
Bilirubin, UA: NEGATIVE
Glucose, UA: NEGATIVE MG/DL
Hemoglobin, UA: NEGATIVE
Ketones, UA: NEGATIVE MG/DL
Nitrite, UA: NEGATIVE
Protein, UA: NEGATIVE MG/DL
RBC, UA: 1 #/HPF (ref 0–3)
Specific Grav, UA: 1.007 (ref 1.003–1.030)
Squamous Epithelial, UA: 1 /HPF (ref 0–10)
UA Cult: NEGATIVE
Urobilinogen, UA: <2 MG/DL (ref <2)
WBC, UA: 2 #/HPF (ref 0–5)
pH, UA: 5.5 (ref 5.0–8.0)

## 2022-05-07 ENCOUNTER — Encounter: Payer: Self-pay | Admitting: Internal Medicine

## 2022-05-07 ENCOUNTER — Encounter: Payer: Self-pay | Admitting: Dermatology

## 2022-05-07 NOTE — Telephone Encounter (Addendum)
Please advise 

## 2022-05-08 NOTE — Telephone Encounter (Signed)
Had labs done 9/23.  Can get in for appointment sooner.

## 2022-05-09 NOTE — Telephone Encounter (Signed)
Spoke with pt, she was notified that per Dr. Thornell Mule, she does not need to do labs since she just had them done in September.    Pt was also offer a sooner appointment but she stated that her symptoms have subsided and she does not need to be seen.    Pt stated that if they come back she will call us for an appointment.    Pt was grateful for the called back.    Molli Hazard, LVN

## 2022-05-19 ENCOUNTER — Encounter: Payer: Commercial Managed Care - PPO | Admitting: Naturopath

## 2022-05-20 ENCOUNTER — Other Ambulatory Visit: Payer: Self-pay | Admitting: Internal Medicine

## 2022-05-20 MED ORDER — BUPROPION XL (DAILY) 150 MG OR TB24
150.0000 mg | ORAL_TABLET | Freq: Every morning | ORAL | 3 refills | Status: DC
Start: 2022-05-20 — End: 2023-08-07

## 2022-06-06 ENCOUNTER — Ambulatory Visit: Payer: No Typology Code available for payment source

## 2022-06-16 ENCOUNTER — Ambulatory Visit: Payer: No Typology Code available for payment source

## 2022-06-20 ENCOUNTER — Ambulatory Visit: Payer: No Typology Code available for payment source

## 2022-06-24 ENCOUNTER — Other Ambulatory Visit: Payer: Self-pay | Admitting: Family Practice

## 2022-06-24 DIAGNOSIS — Z1231 Encounter for screening mammogram for malignant neoplasm of breast: Secondary | ICD-10-CM

## 2022-07-02 ENCOUNTER — Ambulatory Visit: Payer: No Typology Code available for payment source

## 2022-07-02 ENCOUNTER — Ambulatory Visit
Admission: RE | Admit: 2022-07-02 | Discharge: 2022-07-02 | Disposition: A | Discharge status: Home / Self Care | Payer: No Typology Code available for payment source | Attending: Family Practice | Admitting: Family Practice

## 2022-07-02 DIAGNOSIS — Z1231 Encounter for screening mammogram for malignant neoplasm of breast: Secondary | ICD-10-CM | POA: Insufficient documentation

## 2022-07-02 DIAGNOSIS — Z803 Family history of malignant neoplasm of breast: Secondary | ICD-10-CM

## 2022-07-11 ENCOUNTER — Ambulatory Visit: Payer: No Typology Code available for payment source

## 2022-07-14 ENCOUNTER — Encounter: Payer: Self-pay | Admitting: Family Practice

## 2022-07-16 ENCOUNTER — Ambulatory Visit (INDEPENDENT_AMBULATORY_CARE_PROVIDER_SITE_OTHER): Payer: No Typology Code available for payment source

## 2022-07-16 DIAGNOSIS — Z23 Encounter for immunization: Secondary | ICD-10-CM

## 2022-07-16 NOTE — Interdisciplinary (Signed)
Flu vaccine verified by Dr. Suzzanne Cloud MD.

## 2022-07-16 NOTE — Addendum Note (Signed)
Addended by: Baird Kay on: 07/16/2022 03:06 PM     Modules accepted: Level of Service

## 2022-07-16 NOTE — Progress Notes (Signed)
Flu verified.

## 2022-07-16 NOTE — Interdisciplinary (Signed)
Patient is here for flu vaccine

## 2022-07-25 ENCOUNTER — Encounter: Payer: Self-pay | Admitting: Family Practice

## 2022-07-25 ENCOUNTER — Ambulatory Visit: Payer: No Typology Code available for payment source | Admitting: Psychiatry

## 2022-07-25 VITALS — Resp 16 | Ht 60.0 in | Wt 200.6 lb

## 2022-07-25 DIAGNOSIS — G4700 Insomnia, unspecified: Secondary | ICD-10-CM

## 2022-07-25 DIAGNOSIS — R0683 Snoring: Secondary | ICD-10-CM

## 2022-07-25 DIAGNOSIS — G4719 Other hypersomnia: Secondary | ICD-10-CM

## 2022-07-25 DIAGNOSIS — R29818 Other symptoms and signs involving the nervous system: Secondary | ICD-10-CM

## 2022-07-25 DIAGNOSIS — E662 Morbid (severe) obesity with alveolar hypoventilation: Secondary | ICD-10-CM

## 2022-07-25 DIAGNOSIS — E669 Obesity, unspecified: Secondary | ICD-10-CM

## 2022-07-25 NOTE — Progress Notes (Signed)
Chief Complain/ Reason for Visit:    Interval History:  She is a 51 year old female with PMH of obesity who presents to [facility] for evaluation of daytime sleepiness. 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 She moved to [place] in 2020 she noticed having more daytime sedation. She was started on Wellbutrin XL '150mg'$  daily in 2021 for depression. Melatonin '9mg'$  nightly was started in in 2020 and reduced to '2mg'$  nightly May 2023, which improved her sleep. She currently wakes up 1-3 times nightly and falls asleep between 5 mins to several hours later. She takes 0.'25mg'$  alprazolam 2-3 times per month for sleep.      Below is a summary of the sleep intake:    Sleep Wake Schedule:  What time do you get out of bed during the weekdays?: 0630  What time do you go to bed during the weekdays?: 2230  What time do you get out of bed during the weekend?: 1930  What time do you go to bed during the weekend?: 1030  How long does it usually take you to fall asleep?: 15 Minutes  How many times do you wake up during the night?: 3 Times  How many times per week do you take a nap?: 2 Times  Do you use any of the following to help you fall asleep: over the counter sleep aids or prescription medications?: Yes     Sleep Apnea:  Have you had a sleep study before?: No  Do you have a diagnoses of sleep apnea?: No  Are you currently on a treatment for sleep apnea?: No  Have you been told that you snore?: Yes  Have you been told you sometimes stop breathing or gasp for breath in your sleep?: No  Have you experienced any choking or gasping during your sleep?: No    Hypersomnia and Narcolepsy:  Do you frequently have an unexplained, overwhemling, irresistible urge to fall asleep or experience sleep attacks?: Yes    Parasomnia Screen:  Have you been told that you have abnormal behavior during sleep such as walking, talking, or acting out  dreams?: No    Restless Leg Screen:  Do you have unpleasant sensations or urges to move your legs or feet?: No    Sleep Environment and Hygiene:  Do you regularly have an optimal sleeping environment?: Yes  How many servings of alcohol do you have per day?: 0 Servings  How many servings of caffeine do you have per day?: 1 Servings    Psychiatric History:  Have you been diagnosed with depression?: Yes  Do you believe depression or anxiety may contribute to your sleep problem?: No    Epworth Questionnaire:  Sitting and Reading: moderate chance of dozing  Watching TV: slight chance of dozing  Sitting inactive in a public place (e.g. a theater or a meeting): moderate chance of dozing  As a passenger in a car for an hour without a break: high chance of dozing  Lying down to rest in the afternoon when circumstances permit: slight chance of dozing  Sitting and talking to someone: no chance of dozing  Sitting quietly after a lunch without alcohol: slight chance of dozing  In a car, while stopped for a few minutes in traffic: slight chance of dozing    Insomnia Severity Index Questionnaire:  Difficulty Falling Asleep: Mild  Difficulty Staying Asleep: Severe  Problems Waking Too Early: Severe  How SATISFIED/DISSATISFIED are you with your CURRENT sleep pattern?: Dissatisfied  How NOTICEABLE to others do you think your sleep problem is in terms of impairing the quality of your life?: Somewhat  How WORRIED/DISTRESSED are you about your current sleep problem?: Very much Worried  To what extent do you consider your sleep problem to INTERFERE with your daily functioning?: Very much Interfering    FOSQ Score Epworth Score   12  11   Insomnia Severity Index Score RLS Score   20         General Patient Summary    SOCIAL HISTORY:  Recreational Drug use: Denies  Occupation: Housewife  Work Schedule: No set schedule  Exercise: Medication and yoga 2-3 times per week      PAST MEDICAL HISTORY:  Past Medical History:   Diagnosis Date     Abnormal EKG 03/18/2017    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    Class 2 obesity due to excess calories without serious comorbidity with body mass index (BMI) of 38.0 to 38.9 in adult 04/30/2017    Foot pain 06/23/2018    Ingrowing toenail 06/23/2018    Nephrolithiasis     Obesity with body mass index 30 or greater 09/05/2016    Osteoarthritis of knee 09/05/2016    Pain of right thumb 02/06/2016    Patellofemoral stress syndrome 08/22/2016    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    Pemphigus vulgaris 03/18/2019    Prediabetes 06/14/2018    Shortness of breath 03/18/2017    Strain of muscle of right hip 09/05/2016    Synovitis 02/06/2016      FAMILY HISTORY:  Family History   Problem Relation Name Age of Onset    Diabetes Mother          HTN, DJD    Neurological Disease (Parkinsons/Huntingtons/Huntingtons Chorea) Father          DM, alcoholism    Diabetes Father      No Known Problems Sister      Thyroid Brother      Cataract M Grandfather      Cataract P Grandfather         There is no history of OSA or RLS    MEDICATIONS:    Current Outpatient Medications:     ALPRAZolam (XANAX) 0.5 MG tablet, Take 1 tablet (0.5 mg) by mouth nightly as needed for Insomnia., Disp: 30 tablet, Rfl: 2    baclofen (LIORESAL) 10 MG tablet, Take 1 tablet (10 mg) by mouth every 8 hours as needed (muscle spasm)., Disp: 30 tablet, Rfl: 0    buPROPion (WELLBUTRIN XL) 150 MG XL tablet, Take 1 tablet (150 mg) by mouth every morning., Disp: 90 tablet, Rfl: 3    Mefenamic Acid 250 MG CAPS, Take 250 mg by mouth as needed (Menstrual cramps). Take 500 on day one then 250 daily. not to exceed one week duration at a time, Disp: 28 capsule, Rfl: 0    Melatonin 1 MG CAPS, Take 1 tablet by mouth nightly., Disp: 90 capsule, Rfl: 0    Multiple Vitamin (MULTIVITAMIN) capsule, Take 1 capsule by mouth daily., Disp: 30 tablet, Rfl: 0    mycoPHENOLate mofetil (CELLCEPT) 500 MG tablet, Take 2 tablets (1,000 mg) by mouth 2 times daily. (Patient taking differently: Take 2  tablets (1,000 mg) by mouth daily.), Disp: 360 tablet, Rfl: 3    valACYclovir (VALTREX) 500 MG tablet, Take 1 tablet (500 mg) by mouth daily., Disp: 90 tablet, Rfl: 3  ALLERGIES:  Ivig [other]      REVIEW OF SYSTEMS:  No recent weight changes  Eyes: No blurred vision or double vision  Ears: No tinitus or hearing loss  Nasal: No chronic congestion; no runny nose; no post nasal drip; no seasonal allergies  Respiratory: No chronic cough; no wheezing; no shortness of breath with activity  Cardiac: No chest pain; no palpitation; no leg edema; no orthopnea; no pnd  Neuro: No history of stroke; no focal weakness; no sensory deficit; no syncope; no seizures; no neuropathy  Endocrine: No excessive eating or drinking, no hair or skin changes  Musculoskeletal: No joint swelling or pain; fibromyalgia  GI: No heart burn; no constipation; no chronic abdominal pain; no diarrhea  GU: No dysuria; No polyuria  Skin: No itching or rash      PSYCHIATRIC HISTORY:   Current depression: no  Current anxiety: yes  Patient reports no suicidal ideations.        PHYSICAL EXAMINATION:   Resp 16   Ht 5' (1.524 m)   Wt 91 kg (200 lb 9.9 oz)   BMI 39.18 kg/m    GENERAL/CONSTITUTIONAL:  Well developed, well nourished female, who looks at stated age of 51 year old. No acute distress.   HEAD: Normocephalic and atraumatic. Pupils equal, round and reactive to light with intact ocular muscles.   NASAL: Mucosa - normal, normal turbinates, normal septum  OROPHARYNGEAL: Malampati class 3 , visible uvula, mild lateral peritonsillar narrowing present, elevated tongue base present  CARDIOVASCULAR: Regular rate and rhythm. Positive S1, S2. No murmurs, rubs or gallops  PULMONARY: Clear to auscultation, symmetric expansion and unlabored breathing.  No wheezes, rales, or rhonchi.  Gastrointestinal: Soft abdomen; no tenderness; no hepatosplenomegaly   EXTREMITIES: No clubbing, cyanosis or edema  MUSCULOSKELETAL: Normal strength and tone  NEUROLOGICAL:  Alert and Oriented x 3.  Cranial nerves II through XII are grossly intact.  Tone and bulk were normal.  Full range of motion throughout.  Power was 5/5 in all extremities.  Intact finger nose testing.  Normal base and station.    PSYCHIATRIC: Normal mental status, mood is euthymic      LABORATORY DATA:  TSH, Ultrasensitive   Date Value Ref Range Status   04/23/2022 2.199 0.450 - 4.120 uIU/mL Final     Comment:     (NOTE)  If applicable:  Pregnancy Reference Intervals: First Trimester: 0.050 - 3.700 uIU/mL    In pregnancy TSH can be reduced by approximately 0.4 uIU/mL with the   upper reference limit reduced by approximately 0.5 uIU/mL. This limit   should generally be applied at the end of the first trimester,   beginning weeks 7-12, with a gradual return towards the non-pregnant   range in the second and third trimesters.   Sheppard Coil EK et al. Thyroid [id number], 2017).        RBC   Date Value Ref Range Status   04/23/2022 4.48 3.70 - 5.00 MILL/MCL Final     Hgb   Date Value Ref Range Status   04/23/2022 13.0 11.5 - 15.0 G/DL Final     Hematocrit   Date Value Ref Range Status   04/23/2022 39.6 34.0 - 44.0 % Final     MCV   Date Value Ref Range Status   04/23/2022 88.3 81.5 - 97.0 FL Final     MCH   Date Value Ref Range Status   04/23/2022 29.1 27.0 - 33.5 PG Final     MCHC  Date Value Ref Range Status   04/23/2022 32.9 32.0 - 35.5 G/DL Final     RDW-CV   Date Value Ref Range Status   04/23/2022 13.8 11.6 - 14.4 % Final     PLT Count   Date Value Ref Range Status   04/23/2022 399 150 - 400 THOUS/MCL Final      Glucose   Date Value Ref Range Status   04/23/2022 102 70 - 115 mg/dL Final     Comment:        Normal Fasting Glucose:  <100 mg/dL  Impaired Fasting Glucose: 100-125 mg/dL  Provisional DX of diabetes(must be confirmed) >125 mg/dL       Ferritin   Date Value Ref Range Status   05/03/2021 24 10 - 107 NG/ML Final      Vitamin B12 Lvl   Date Value Ref Range Status   04/23/2022 253 180 - 1,241 PG/ML Final             OTHER DATA REVIEWED:         ASSESSMENT:  Anita Turner is a 51 year old female with PMH of obesity who presents for the evaluation of daytime sleepiness. She was last seen on 09/19/2020 and was recommended to have a sleep study done but was not able to do so. She presents for follow up today.  The patient exhibit signs and symptoms suggestive of the following sleep disorders:    #. Insomnia: She has sleep maintenance insomnia. The etiology includes psychophysiologic insomnia. Underlying sleep disorder is also considered specifically SDB and movement disorder.    #. Sleep disordered breathing: She presents with sleepiness, and snoring. Clinical examination is supportive of underlying SDB. She is at increased risk for obstructive sleep apnea. The cardiovascular risks of untreated sleep apnea necessitate further evaluation of SDB.     # Sleep-related movement: Previous complaint of leg movements during sleep    List of provisional sleep disorders diagnoses and related symptoms/signs are listed in the below:    ICD-10-CM ICD-9-CM    1. Excessive daytime sleepiness  G47.19 780.54 Overnight Polysomnography      2. Insomnia, unspecified type  G47.00 780.52       3. Obesity (BMI 30-39.9)  E66.9 278.00 Overnight Polysomnography      4. Snoring  R06.83 786.09       5. Suspected sleep apnea  R29.818 781.99 Overnight Polysomnography      6. Obesity hypoventilation syndrome (CMS-HCC)  E66.2 278.03 Overnight Polysomnography            PLAN:   #. Insomnia:    - practice good sleep hygiene.   - consider referral to CBT-I.   - prescriptions sleep aid can be considered.    #. Sleep disordered breathing:    - overnight polysomnography (PSG) versus home sleep apnea testing (HSAT) were considered. The patient is a candidate for an PSG given need to assess for sleep related breathing and movement disorders.   - consider a PAP treatment.    - potential alternative therapies such as mandibular advancement device or hypoglossal  nerve stimulation therapy were discussed.     # Sleep-related movement:    - consider checking iron panel.   - will assess during PSG       Jaci Lazier MD  Child and Adolescent Psychiatry Fellow Physician PGY5

## 2022-07-25 NOTE — Patient Instructions (Signed)
1. We will arrange an in-lab sleep study (polysomnography).   2. If the sleep study is positive for OSA, then you'll be placed on one the PAP therapies in a form of APAP (automatic PAP), CPAP or BPAP.   3. If you can not tolerate a PAP therapy, then an alternative treatment might be considered after reviewing the treatment outcome in the sleep clinic.   4. Use caution in operating machinery or driving a vehicle until you have the OSA diagnosed and treated.  5. Follow up to the sleep clinic/center will be arranged after the sleep study.

## 2022-07-25 NOTE — Addendum Note (Signed)
Addended by: Doran Stabler on: 07/25/2022 11:41 AM     Modules accepted: Level of Service

## 2022-07-25 NOTE — Progress Notes (Signed)
Staff involved in this encounter:   Resident physician: Jaci Lazier, MD and   Attending physician, Raeford Razor, MD, MS       Attending attestation  I personally obtained the history and performed the exam along with the resident. I personally reviewed the diagnostic information and agree with the interpretation as documented above. The assessment and plan was jointly formulated after discussion with the resident. The resident then recorded the history, exam, and decision making based on our joint evaluations. I have reviewed and agree with the documentation above and have edited corrections as necessary.    The encounter diagnoses are as the below:      ICD-10-CM ICD-9-CM    1. Excessive daytime sleepiness  G47.19 780.54 Overnight Polysomnography      2. Insomnia, unspecified type  G47.00 780.52       3. Obesity (BMI 30-39.9)  E66.9 278.00 Overnight Polysomnography      4. Snoring  R06.83 786.09       5. Suspected sleep apnea  R29.818 781.99 Overnight Polysomnography      6. Obesity hypoventilation syndrome (CMS-HCC)  E66.2 278.03 Overnight Polysomnography            MDM: High level as I reviewed and interpreted relevant document/notes, labs, previous sleep visit records, and also ordered a new study, addressed all possible diagnoses and treatment approaches, and engaged in medical decision with high complexity of likely significant sleep-related breathing and movement disorders with high risk of morbidity related to treatment/complications.           Raeford Razor, MD, MS  Attending Physician  Sleep Medicine    [facility]

## 2022-07-29 ENCOUNTER — Encounter: Payer: Self-pay | Admitting: Internal Medicine

## 2022-07-30 NOTE — Telephone Encounter (Signed)
Contacted patient and scheduled Zoom Video Visit to discuss sleep issues.

## 2022-07-30 NOTE — Telephone Encounter (Signed)
From: Azzie Glatter  To: Cheree Ditto, MD  Sent: 07/29/2022 5:22 AM PST  Subject: Sleep     Good morning Doctor, for past few weeks I am noticing melatonin is not working I fall asleep but I wake up as usual around 2:30 or 3 am cannot go back to sleep, noticing lack of sleep is effecting my daily activities can you help me please? I went for sleep evaluation have an appointment for March 3 rd for sleep study.   Thank you   Anita Turner

## 2022-08-13 ENCOUNTER — Encounter: Payer: Commercial Managed Care - PPO | Admitting: Internal Medicine

## 2022-08-13 ENCOUNTER — Telehealth: Payer: Self-pay | Admitting: Pulmonary Medicine

## 2022-08-13 NOTE — Telephone Encounter (Signed)
08/13/2021: called and left v/m for patient offering sooner appointment for PSG.  -sk

## 2022-08-20 ENCOUNTER — Telehealth: Payer: Self-pay | Admitting: Dermatology

## 2022-08-20 NOTE — Telephone Encounter (Signed)
VM LEFT TO GET PATIENT UPDATED INSURANCE INFO 08/20/2022 Anita Turner

## 2022-08-20 NOTE — Progress Notes (Signed)
Department of Dermatology  [place]  PATIENT: Anita Turner  MRN: [id number]  DOB: 12/11/1970  DATE OF SERVICE: 08/21/2022    DERMATOLOGY FOLLOW-UP PATIENT VISIT     REFERRING PRACTITIONER: Crosby Oyster  PRIMARY CARE PROVIDER: Venetia Constable  CHIEF COMPLAINT: No chief complaint on file.    Subjective:     Anita Turner is a 52 year old female who was seen in clinic for f/u pemphigus vulgaris and pemphigus foliaceous.      Objective:   General: Pleasant, in no acute distress, appears stated age  Respiratory: No increased work of breathing    There were no vitals filed for this visit.    Skin Exam:  Pertinent skin findings below:  ***    02/07/2019   Basement Membrane Zone (BMZ) IgG, IgG4, and IgA  IgG:  Negative, monkey esophagus substrate        Negative, human split skin substrate      IgG4:  Negative, monkey esophagus substrate             Negative, human split skin substrate   IgA:  Negative, monkey esophagus substrate        Negative, human split skin substrate   Positive IgG, including IgG4, cell surface antibodies,                monkey esophagus substrate      Enzyme Linked Immunosorbent Assay (ELISA)   --------------------------------------------------   Bullous Pemphigoid (BP) 180 and 230 IgG Antibodies   IgG BP 180 antibodies:  1 unit  NEGATIVE  IgG BP 230 antibodies:  1 unit  NEGATIVE     02/07/2019  Cell Surface IgG Antibodies   IgG:  Positive, titer 1:2560 (H), monkey esophagus substrate        Positive, titer 1:640 (H), intact human skin substrate      Enzyme-Linked Immunosorbent Assay (ELISA)  -----------------------------------------  Desmoglein (DSG) 1 and 3 IgG Antibodies   IgG desmoglein 1 antibodies:   7 units  NEGATIVE       Reference Range:          Positive (H) = Greater than 20 units          Borderline/Indeterminate = 14-20 units          Negative = Less than 14 units   IgG desmoglein 3 antibodies: 320 units (H)  POSITIVE         (Initial level, 139 units, greater than high                 calibrator; diluted to achieve 64 units, within          assay calibrators, and multiplied by the dilution          factor of 5)   COMMENTS   Specific   -------------------------------------------------   These indirect immunofluorescence results, demonstrating   positive IgG cell surface antibody reactivity on both   monkey esophagus and intact human skin substrates, support   the diagnosis of pemphigus.    Assessment/Plan     # Pemphigus vulgaris  # Medication monitoring  Xerostomia resolved with resumption of cellcept.  Currently well-controlled with no flares since last visit.   Not on prednisone.  Patient previously followed by Dr. Charm Barges and prior [facility] including serologies for pemphigus/pemphigoid, CBC, CMP. She was previously on rituxan. Was also on IVIg monthly but stopped in November 2021 due to difficulty tolerating infusions.      - Continue cellcept 1000 mg  bid  - As well-controlled at this time, will hold off on adding additional therapies  - Have labs drawn today: CBC and CMP, and every 3-4 months  - Continue B12 replacement   - Continue NCN 500 mg tid   - Follow-up with Dr. 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 Risks, benefits, alternatives to therapy were discussed. Questions related to this recommended plan of care were answered.    RTC *** months or sooner prn    Scribe Attestation  The notes I am recording reflect only actions made by and judgments taken by this provider, Dr. Thornell Mule, for whom I am scribing today.  I have performed no independent clinical work.    [employer]    Provider Attestation for Scribed Note    As the attending provider, I agree with the scribed content.  Any changes or edits are noted in the text above. I personally spent ***  minutes of total face-to face and/or non-face-to-face time on the date of service.     Crosby Oyster

## 2022-08-21 ENCOUNTER — Ambulatory Visit: Payer: 59 | Admitting: Dermatology

## 2022-08-21 DIAGNOSIS — Z79899 Other long term (current) drug therapy: Secondary | ICD-10-CM

## 2022-08-21 DIAGNOSIS — L100 Pemphigus vulgaris: Secondary | ICD-10-CM

## 2022-09-01 ENCOUNTER — Telehealth: Payer: Self-pay

## 2022-09-01 NOTE — Telephone Encounter (Signed)
Patient calling in to confirm sleep study for tomorrow. Called office and transferred call to [place].

## 2022-09-02 ENCOUNTER — Telehealth: Payer: Self-pay | Admitting: Pulmonary Medicine

## 2022-09-02 NOTE — Telephone Encounter (Signed)
09/02/2022: called and left voicemail for patient informing her of no authorization for sleep study and appointment for today will be cancelled.  Advised patient to reschedule for a few weeks out to ensure we will get authorization.  Requested patient call back to have [place] assist with rescheduling.  -sk

## 2022-09-03 ENCOUNTER — Other Ambulatory Visit: Payer: Self-pay | Admitting: Dermatology

## 2022-09-03 DIAGNOSIS — B009 Herpesviral infection, unspecified: Secondary | ICD-10-CM

## 2022-09-03 DIAGNOSIS — L100 Pemphigus vulgaris: Secondary | ICD-10-CM

## 2022-09-04 MED ORDER — MYCOPHENOLATE MOFETIL 500 MG OR TABS
1000.0000 mg | ORAL_TABLET | Freq: Two times a day (BID) | ORAL | 3 refills | Status: AC
Start: 2022-09-04 — End: ?

## 2022-09-04 MED ORDER — VALACYCLOVIR HCL 500 MG OR TABS
500.0000 mg | ORAL_TABLET | Freq: Every day | ORAL | 3 refills | Status: DC
Start: 2022-09-04 — End: 2023-01-22

## 2022-09-10 NOTE — Patient Instructions (Signed)
It was nice to see you in my clinic today. Thank you for allowing me to take part in your care. If I may be of any further assistance, please do not hesitate to contact [phone]. If you have not done so already, please contact the front desk on your way out for information on secure health messaging to communicate with me electronically. For your health, it is important that you have the following done in a timely manner.    # Please schedule an appointment with Dr. Jolyn Lent or your regular Primary Care Provider at the front desk in 4 weeks. If you have any new or emergent concerns, please seek medical attention immediately or call 911. Please arrive to your appointment at least 20 minutes prior in order to check in and fill out paperwork.    # As we discussed, please work on diet and exercise as tolerated.     # Your medications were sent to your pharmacy.    # You will be receiving a call and email regarding a survey on the service you received today. Please take the one-minute survey to help [place] understand your experience and to continue providing you with great service in the future.    Sincerely,    Jolyn Lent, MD  [place], [place]

## 2022-09-10 NOTE — Progress Notes (Signed)
Patient: Anita Turner  DOB: 03/06/1971  Primary Care Provider: Venetia Constable  BRIEF VISIT/FOLLOW UP-ADULT    Subjective:      Anita Turner is a 52 year old Not Hispanic, Latino(a), or Spanish origin female who presents today for Follow Up (Insomnia)      Current Concerns/Interval History:     #Follow up on sleep:  Has been on the melatonin 1 mg for about one month, and still having issues staying asleep. Tends to wake up around 2 am and then cannot fall back asleep.  She was supposed to go to a sleep study but is having insurance issues and needs to fix it before she can go.  She has an rx of xanax, uses it maybe once a week. She does not want to take something that can is not habit forming.    #Weight:  Working on intermittent  fasting and feeling good but not really having energy for working out.    HCM  Pneumo: due and desires today  Shingles and COVID: due and will get another day.      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)    Other specified congenital anomaly of skin    Current use of steroid medication    Recurrent HSV (herpes simplex virus)    Papanicolaou smear of cervix with low grade squamous intraepithelial lesion (LGSIL)    Anxiety and depression    Mixed hyperlipidemia    Low HDL (under 40)       Past Surgical History:  Past Surgical History:   Procedure Laterality Date    CHOLECYSTECTOMY  2017    APPENDECTOMY  2002       Medications:    Current Outpatient  Medications:     ALPRAZolam (XANAX) 0.5 MG tablet, Take 1 tablet (0.5 mg) by mouth nightly as needed for Insomnia., Disp: 30 tablet, Rfl: 2    baclofen (LIORESAL) 10 MG tablet, Take 1 tablet (10 mg) by mouth every 8 hours as needed (muscle spasm)., Disp: 30 tablet, Rfl: 0    buPROPion (WELLBUTRIN XL) 150 MG XL tablet, Take 1 tablet (150 mg) by mouth every morning., Disp: 90 tablet, Rfl: 3    Mefenamic Acid 250 MG CAPS, Take 250 mg by mouth as needed (Menstrual cramps). Take 500 on day one then 250 daily. not to exceed one week duration at a time, Disp: 28 capsule, Rfl: 0    Melatonin 1 MG CAPS, Take 1 tablet by mouth nightly., Disp: 90 capsule, Rfl: 0    Multiple Vitamin (MULTIVITAMIN) capsule, Take 1 capsule by mouth daily., Disp: 30 tablet, Rfl: 0    mycoPHENOLate mofetil (CELLCEPT) 500 MG tablet, TAKE 2 TABLETS BY MOUTH 2 TIMES DAILY, Disp: 360 tablet, Rfl: 3    valACYclovir (VALTREX) 500 MG tablet, TAKE 1 TABLET  BY MOUTH EVERY DAY, Disp: 90 tablet, Rfl: 3    Allergies:  Allergies   Allergen Reactions    Ivig [Other] Other     Lymph nodes swollen        FAMILY HISTORY:  Family History   Problem Relation Name Age of Onset    Diabetes Mother          HTN, DJD    Neurological Disease (Parkinsons/Huntingtons/Huntingtons Chorea) Father          DM, alcoholism    Diabetes Father      No Known Problems Sister      Thyroid Brother      Cataract M Grandfather      Cataract P Grandfather         SOCIAL HISTORY:      reports that she has never smoked. She has never used smokeless tobacco. She reports that she does not currently use alcohol. She reports that she does not use drugs.    Review of Systems:  Review of Systems    Objective:     BP 104/70   Pulse 73   Temp 97.7 F (36.5 C) (Oral)   Resp 18   Ht 5' (1.524 m)   Wt 90.5 kg (199 lb 8 oz)   SpO2 100%   BMI 38.96 kg/m     Physical Exam:  Physical Exam  Vitals reviewed.   Constitutional:       Appearance: Normal appearance.   Eyes:      Extraocular Movements:  Extraocular movements intact.      Conjunctiva/sclera: Conjunctivae normal.   Cardiovascular:      Rate and Rhythm: Normal rate and regular rhythm.   Pulmonary:      Effort: Pulmonary effort is normal.      Breath sounds: Normal breath sounds.   Neurological:      Mental Status: She is alert.   Psychiatric:         Mood and Affect: Mood normal.         Behavior: Behavior normal.         Labs and Imaging:  Lab Results   Component Value Date    HGB 13.0 04/23/2022    SODIUM 140 04/23/2022    K 4.7 04/23/2022    CREAT 0.7 04/23/2022    GFR >60 04/23/2022    AST 19 04/23/2022    ALT 13 04/23/2022    TBILI 0.3 04/23/2022    A1C 5.4 04/23/2022    CHOL 170 04/23/2022    TRIG 261 (H) 04/23/2022         Assessment & Plan:     Anita Turner was seen today for follow up.    Diagnoses and all orders for this visit:    Insomnia, unspecified type  Chronic and not well controlled by melatonin.  Will trial on low dose of Trazodone and recommend completing sleep study.  -     traZODone (DESYREL) 50 MG tablet; Take 1 tablet (50 mg) by mouth nightly.    Class 2 severe obesity due to excess calories with serious comorbidity and body mass index (BMI) of 38.0 to 38.9 in adult (CMS-HCC)  Discussed diet and activity and addressing sleep issues as an underlying problem with energy.    Need for vaccination  -     Pneumococcal Conj Vaccine 20 Valent, IM  -     shingles [facility]) - Admin Now; Future  -     shingles ([place]) - Admin in 2-6  Months; Future  -     COVID-19 [facility]) Vaccine 12+ IM, 2023-24; Future    Routine Health Maintenance:  The 10-year ASCVD risk score (Arnett DK, et al., 2019) is: 1.6%    Values used to calculate the score:      Age: 29 years      Sex: Female      Is Non-Hispanic African American: No      Diabetic: No      Tobacco smoker: No      Systolic Blood Pressure: [id number] mmHg      Is BP treated: No      HDL Cholesterol: 38 MG/DL      Total Cholesterol: 170 MG/DL    Health Maintenance   Topic Date Due    Hepatitis C  Screening  Never done    Shingles Vaccine (1 of 2) Never done    Pneumococcal Vaccine (2 - PCV) 03/29/2022    COVID-19 Vaccine (4 - 2023-24 season) 04/04/2022    PHQ9 Depression Monitoring ([place])  06/27/2022    Annual AUDIT Screen  10/22/2022    Annual DAST Screen  10/22/2022    Breast Cancer Screen  07/08/2023    Cervical Cancer Screening  10/05/2026    Tetanus (2 - Td or Tdap) 03/30/2031    Colorectal Cancer Screening  01/09/2032    Influenza  Completed    Universal HIV Screening  Completed    Polio Vaccine  Aged Out    IMM_Hep A Vaccine Series  Aged Out    HPV Vaccine <= 70 Yrs  Aged Out    Meningococcal MCV4 Vaccine  Aged Out       Requested Prescriptions      No prescriptions requested or ordered in this encounter       Follow Up: No follow-ups on file.    Future Appointments   Date Time Provider [place]   09/11/2022  8:40 AM Blenda Peals, Hunt Oris, MD [facility]   10/06/2022 10:00 AM Jaquita Folds, MD [place] [facility] [place]   10/20/2022 11:00 AM Serafina Mitchell, NP [facility]   12/11/2022 10:40 AM Crosby Oyster, MD [facility]

## 2022-09-11 ENCOUNTER — Encounter: Payer: Self-pay | Admitting: Internal Medicine

## 2022-09-11 ENCOUNTER — Ambulatory Visit (INDEPENDENT_AMBULATORY_CARE_PROVIDER_SITE_OTHER): Payer: 59 | Admitting: Internal Medicine

## 2022-09-11 VITALS — BP 104/70 | HR 73 | Temp 97.7°F | Resp 18 | Ht 60.0 in | Wt 199.5 lb

## 2022-09-11 DIAGNOSIS — Z6838 Body mass index (BMI) 38.0-38.9, adult: Secondary | ICD-10-CM

## 2022-09-11 DIAGNOSIS — Z23 Encounter for immunization: Secondary | ICD-10-CM

## 2022-09-11 DIAGNOSIS — G4700 Insomnia, unspecified: Secondary | ICD-10-CM

## 2022-09-11 MED ORDER — TRAZODONE HCL 50 MG OR TABS
50.0000 mg | ORAL_TABLET | Freq: Every evening | ORAL | 0 refills | Status: DC
Start: 2022-09-11 — End: 2022-10-15

## 2022-09-11 NOTE — Interdisciplinary (Signed)
Patient is here today for follow up on insomnia.    Patient would like to discuss vaccines with provider.

## 2022-09-11 NOTE — Interdisciplinary (Signed)
PREVNAR 20 VACCINE VERIFIED, CFABELA, LVN

## 2022-09-17 ENCOUNTER — Ambulatory Visit: Payer: 59 | Admitting: Retina Specialist

## 2022-09-17 ENCOUNTER — Telehealth: Payer: Self-pay | Admitting: Retina Specialist

## 2022-09-17 NOTE — Telephone Encounter (Signed)
Patient called she's experiencing right eye pain/discomfort and she would like to be seen as soonest possible. I called the clinic spoke to Gae Bon they will ask Dr. Gregary Cromer if they can add the patient at 3:00 pm today.    Please follow up, thank you.

## 2022-09-17 NOTE — Telephone Encounter (Signed)
LVM for patient and patient's husband, no answer. I added her to Dr. Lorella Nimrod 3pm today for eye pain.

## 2022-09-24 ENCOUNTER — Encounter: Payer: Self-pay | Admitting: Family Practice

## 2022-09-29 ENCOUNTER — Telehealth: Payer: Self-pay | Admitting: Family Practice

## 2022-09-29 ENCOUNTER — Telehealth: Payer: Self-pay | Admitting: Psychiatry

## 2022-09-29 NOTE — Telephone Encounter (Signed)
PT AWARE WE RECEIVED AUTH APPOINTMENT WAS SCHEDULED

## 2022-09-29 NOTE — Telephone Encounter (Signed)
Patient called to follow up on status of overnight sleep center study

## 2022-09-29 NOTE — Telephone Encounter (Signed)
09/29/2022: message has been sent to [employer] requesting status on authorization request for patient's sleep study.  Waiting on response.  -sk

## 2022-09-29 NOTE — Telephone Encounter (Signed)
09/29/2022: [facility] reached out to insurance re: status of authorization request for sleep study.  Per [employer], no authorization is required for it.  Patient was informed and appointment for sleep study has been made by office.  -sk

## 2022-09-29 NOTE — Telephone Encounter (Signed)
Patient requesting sooner appointment.     Reason for sooner appointment:  Patient has a medical concern (not triage related) and needs a sooner appointment    Tentatively scheduled with Provider: Margaretha Glassing at Date/Time: 10/21/2022 at [place] Location: [place].    Please assist.

## 2022-09-30 NOTE — Telephone Encounter (Signed)
Called and spoke to patient and offered patient if she would like to make an appointment with any available provider. Patient stated she would like to keep her current appointment with Dr. Loni Muse on 10/21/22.

## 2022-10-02 ENCOUNTER — Ambulatory Visit (INDEPENDENT_AMBULATORY_CARE_PROVIDER_SITE_OTHER): Payer: Commercial Managed Care - PPO

## 2022-10-02 DIAGNOSIS — E669 Obesity, unspecified: Secondary | ICD-10-CM

## 2022-10-02 DIAGNOSIS — E662 Morbid (severe) obesity with alveolar hypoventilation: Secondary | ICD-10-CM

## 2022-10-02 DIAGNOSIS — R29818 Other symptoms and signs involving the nervous system: Secondary | ICD-10-CM

## 2022-10-02 DIAGNOSIS — G4719 Other hypersomnia: Secondary | ICD-10-CM

## 2022-10-03 ENCOUNTER — Other Ambulatory Visit: Payer: Self-pay | Admitting: Internal Medicine

## 2022-10-03 NOTE — Progress Notes (Signed)
TECH NOTE    VITALS  Blood pressure (BP): 142/89  Heart Rate: 78  Respirations: 16  Height: 5' (152.4 cm)  Weight: 90.3 kg (199 lb)  BMI (Calculated): 38.9  SpO2: 100 %  Neck circumference (in): 32 in  Waist (cm): 101 cm  Hip (cm): 132 cm  Waist/Hip: 0.77   Referred by: Serafina Mitchell     Patient is here for a Diagnostic. Patient arrived to the [facility] on time with patient's spouse.      Incomplete study: No      Pre-study education: Yes  Translator needed: No Optometrist ID/Reference #: N/A       On-call provider contacted: No  Name:   On-call provider verbal instructions: N/A    PAP initiated: N    Patient was discharged from [facility] on time with patient's spouse.  Patient and/or caregiver was alert, oriented and awake upon discharge.     Wilhelmenia Addis Purcell Nails

## 2022-10-06 ENCOUNTER — Ambulatory Visit: Payer: No Typology Code available for payment source | Admitting: Ob/Gyn

## 2022-10-14 NOTE — Progress Notes (Signed)
I had the pleasure of seeing Anita Turner for follow up at the [facility].  She is a 52 year old female with PMH of HLD, fatty liver, anxiety, and depression who presents for follow-up of sleep study results    Interval History:  Since the last visit, Anita Turner reports she slept better than usual the night of the sleep study. She did not take melatonin 3mg  which she usually takes in divided doses 3 hours before bed and at bedtime. She was prescribed trazodone but it did not seem to help. She has not been able to stop using it completely since going home though. When she ends up on her back, she feels the urge to cough, so often finds herself changing position at night. She also baseline has knee pain. She is concerned about getting addicted to medications.     Epworth Sleep Scale Score: 7    FOSQ Score: 16    10/02/22 PSG Results  1. SLEEP ARCHITECTURE: The total recording time of the diagnostic study was 420.1 minutes. The total sleep time was 340.0 minutes resulting in a sleep efficiency of 80.9% (normal ? 85%). The patient spent 23.0 minutes of total time in stage 1 sleep, 174.5 minutes in stage 2 sleep, 81.0 minutes in stage 3 sleep and 61.5 minutes in stage REM sleep. Latency to sleep onset was normal at 8.9 minutes with a normal latency to REM onset of 84.5 minutes.   2. AROUSALS: Wake after sleep onset (WASO) was 71.6 minutes with 94 arousals for an arousal index of 16.6 per hour.   3. EEG OBSERVATIONS: EEG did not show evidence of obvious epileptiform activity using a limited montage, and there were no clinical events concerning for seizure.   4. RESPIRATORY EVENTS: [name] had a total of Obstructive Apnea Hypopnea Index (oAHI) of 2.8 and Central Apnea Hypopnea Index (cAHI) of 0, for an overall Apnea Hypopnea Index (AHI) of 2.8 per hour. There were a total of 10 RERA events for a total Respiratory Disturbance Index (RDI) of 4.6 per hour. The longest event was 70.7 seconds. Mild  snoring was observed.   5. OXIMETRY: Baseline oxygen saturation during wake at the time of device set up was 100%. The lowest oxygen saturation was 91.0%. Total number of desaturations scored ?4%: 16.   6. CARDIAC: ECG, with a limited montage, showed normal sinus rhythm. The overall average pulse rate was 84.3 beats per minute with an average pulse rate of 84.4 in NREM and 82.5 in REM. The maximum pulse rate was recorded at 119.0.   7. CARBON DIOXIDE: The transcutaneous carbon dioxide was within normal limits at 33-40 mmHg throughout the study.   8. MOVEMENT: There was 283 periodic limb movements (PLM) for a PLM index of 49.9 per hour (normal <15 per hour). There were 36 isolated limb movements with an index of 6.4 per hour. The total periodic limb movement arousal index was 8.6 per hour. No bruxism was observed throughout the study.         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    Past surgical history:  She  has a past surgical history that includes Cholecystectomy (2017) and Appendectomy (2002).     Social history:  She  reports that she has never smoked. She has never used smokeless tobacco. She reports that she does not currently use alcohol. She reports that she does not use drugs.     Family history:  Her  family history includes Cataract in her m grandfather and p  grandfather; Diabetes in her father and mother; Neurological Disease (Parkinsons/Huntingtons/Huntingtons Chorea) in her father; No Known Problems in her sister; Thyroid in her brother.     Medications:  She  has a current medication list which includes the following prescription(s): alprazolam, baclofen, bupropion, gabapentin, mefenamic acid, melatonin, multivitamin, mycophenolate mofetil, and valacyclovir.      Allergies:  She is allergic to ivig [other].     Review of Systems:  10/14 systems reviewed, all negative except where mentioned in HPI or below:  MSK: +knee pain     Physical Exam:  Ht 5' (1.524 m)   BMI 38.86 kg/m    General:  Well developed, well nourished female, who looks as stated age of 52 year old. No acute distress.   Neurological: Alert and Oriented x 3. Grossly normal, normal and symmetrical strength  Psychiatric: Normal mental status, mood is euthymic    LABORATORY DATA:  TSH: No results found for: "TSH"   CBC:   RBC   Date Value Ref Range Status   04/23/2022 4.48 3.70 - 5.00 MILL/MCL Final     Hgb   Date Value Ref Range Status   04/23/2022 13.0 11.5 - 15.0 G/DL Final     Hematocrit   Date Value Ref Range Status   04/23/2022 39.6 34.0 - 44.0 % Final     MCV   Date Value Ref Range Status   04/23/2022 88.3 81.5 - 97.0 FL Final     MCH   Date Value Ref Range Status   04/23/2022 29.1 27.0 - 33.5 PG Final     MCHC   Date Value Ref Range Status   04/23/2022 32.9 32.0 - 35.5 G/DL Final     RDW-CV   Date Value Ref Range Status   04/23/2022 13.8 11.6 - 14.4 % Final     PLT Count   Date Value Ref Range Status   04/23/2022 399 150 - 400 THOUS/MCL Final      Glucose:   Glucose   Date Value Ref Range Status   04/23/2022 102 70 - 115 mg/dL Final     Comment:        Normal Fasting Glucose:  <100 mg/dL  Impaired Fasting Glucose: 100-125 mg/dL  Provisional DX of diabetes(must be confirmed) >125 mg/dL       Ferritin:   Ferritin   Date Value Ref Range Status   05/03/2021 24 10 - 107 NG/ML Final      Iron  Saturation: No results found for: "PSAT"   B12:   Vitamin B12 Lvl   Date Value Ref Range Status   04/23/2022 253 180 - 1,241 PG/ML Final          IMPRESSION:   Anita Blackwelder is a 52 year old female with PMH of HLD, fatty liver, anxiety, and depression who presents today for follow up of PSG results that found significant periodic limb movements of sleep and mild snoring. There was no  overall evidence of OSA, though supine REM sleep was not captured     #. Periodic Limb Movements of Sleep: PLMI 49.9/hr and PLM arousal index 8.6/hr; possible contributing causes include insufficient iron levels and/or neuropathy     #. Snoring: mild     PLAN:  #. Iron studies discussed and ordered to assess for possible cause of PLMD.   #. Gabapentin discussed and prescribed to be trialed for   #. Consider incorporating a walk in the late afternoon   #. Take a warm shower or bath and stretch your legs before sleep   #. Try to avoid sleeping on your back   #. Practice good sleep hygiene  #. It is recommended to not drive or operate heavy machinery while sleepy as this increases the risk of injury or death.   #. Healthy weight loss is encouraged as it may help reduce the severity of underlying sleep breathing disorder.  #. Follow up with NP after iron blood tests. Will determine appropriate interval of follow up after initial trial of gabapentin. Please contact clinic if you have any questions or concerns that arise before your next appointment.      Sincerely,  Alcide Goodness, NP    I have personally provided 30 minutes of clinical care time. Time includes review of results, clarification of symptoms, treatment recommendations, communication to arrange next steps and documentation of the above.

## 2022-10-15 ENCOUNTER — Encounter: Payer: Self-pay | Admitting: Nurse Practitioner

## 2022-10-15 ENCOUNTER — Ambulatory Visit (INDEPENDENT_AMBULATORY_CARE_PROVIDER_SITE_OTHER): Payer: Commercial Managed Care - PPO | Admitting: Nurse Practitioner

## 2022-10-15 VITALS — Ht 60.0 in

## 2022-10-15 DIAGNOSIS — R0683 Snoring: Secondary | ICD-10-CM

## 2022-10-15 DIAGNOSIS — G4761 Periodic limb movement disorder: Secondary | ICD-10-CM

## 2022-10-15 MED ORDER — GABAPENTIN 100 MG OR CAPS
ORAL_CAPSULE | ORAL | 1 refills | Status: DC
Start: 2022-10-15 — End: 2022-12-30

## 2022-10-20 ENCOUNTER — Ambulatory Visit: Payer: No Typology Code available for payment source | Admitting: Nurse Practitioner

## 2022-10-21 ENCOUNTER — Ambulatory Visit: Payer: No Typology Code available for payment source | Admitting: Family Practice

## 2022-10-30 ENCOUNTER — Ambulatory Visit: Payer: No Typology Code available for payment source | Admitting: Internal Medicine

## 2022-11-04 NOTE — Progress Notes (Signed)
Patient: Anita Turner  DOB: 1971-03-21  Primary Care Provider: Ricardo Jericho  BRIEF VISIT/FOLLOW UP-ADULT    Subjective:      Anita Turner is a 52 year old Not Hispanic, Latino(a), or Spanish origin female who presents today for Follow Up      Current Concerns/Interval History:    09/11/2022:    #Weight:  Working on intermittent  fasting and feeling good but not really having energy for working out.  Class 2 severe obesity due to excess calories with serious comorbidity and body mass index (BMI) of 38.0 to 38.9 in adult (CMS-HCC)  Discussed diet and activity and addressing sleep issues as an underlying problem with energy.     Today:   Wt Readings from Last 5 Encounters:   11/05/22 90.3 kg (199 lb)   10/02/22 90.3 kg (199 lb)   09/11/22 90.5 kg (199 lb 8 oz)   07/25/22 91 kg (200 lb 9.9 oz)   04/29/22 91.3 kg (201 lb 3 oz)   She is trying to eat healthier and avoid junk food.  She is walking more often.  Last week joined a fitness program.      #Follow up on sleep:  Has been on the melatonin 1 mg for about one month, and still having issues staying asleep. Tends to wake up around 2 am and then cannot fall back asleep.  She was supposed to go to a sleep study but is having insurance issues and needs to fix it before she can go.  She has an rx of xanax, uses it maybe once a week. She does not want to take something that can is not habit forming.  Insomnia, unspecified type  Chronic and not well controlled by melatonin.  Will trial on low dose of Trazodone and recommend completing sleep study.  -     traZODone (DESYREL) 50 MG tablet; Take 1 tablet (50 mg) by mouth nightly.    Sleep study done 10/02/2022  Office visit 10/15/2022:   "PLAN:  #. Iron studies discussed and ordered to assess for possible cause of PLMD.   #. Gabapentin discussed and prescribed to be trialed for    #. Consider incorporating a walk in the late afternoon   #. Take a warm shower or bath and stretch your legs before sleep   #. Try to avoid  sleeping on your back   #. Practice good sleep hygiene  #. It is recommended to not drive or operate heavy machinery while sleepy as this increases the risk of injury or death.   #. Healthy weight loss is encouraged as it may help reduce the severity of underlying sleep breathing disorder.  #. Follow up with NP after iron blood tests. Will determine appropriate interval of follow up after initial trial of gabapentin. Please contact clinic if you have any questions or concerns that arise before your next appointment."    Per patient, she is taking gabapentin and is doing well, she feels the 200 mg works well  She is doing a walk in the afternoon and one after dinner  Working on shower or bath and stretching before bed  She is working on sleeping on her side  Working on sleep hygiene  She tried the trazodone but did not find it helpful and stopped it    #vitamin B12 def  History of but is not taking it orally, history of injection    HCM:  COVID: declined today but will get at a future visit,  will preorder  Shingles: declined today but will get at a future visit, will preorder      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)    Other specified congenital anomaly of skin    Current use of steroid medication    Recurrent HSV (herpes simplex virus)    Papanicolaou smear of cervix with low grade squamous intraepithelial lesion (LGSIL)    Anxiety and depression    Mixed hyperlipidemia    Low HDL  (under 40)       Past Surgical History:  Past Surgical History:   Procedure Laterality Date    CHOLECYSTECTOMY  2017    APPENDECTOMY  2002       Medications:    Current Outpatient Medications:     ALPRAZolam (XANAX) 0.5 MG tablet, Take 1 tablet (0.5 mg) by mouth nightly as needed for Insomnia., Disp: 30 tablet, Rfl: 2    baclofen (LIORESAL) 10 MG tablet, Take 1 tablet (10 mg) by mouth every 8 hours as needed (muscle spasm)., Disp: 30 tablet, Rfl: 0    buPROPion (WELLBUTRIN XL) 150 MG XL tablet, Take 1 tablet (150 mg) by mouth every morning., Disp: 90 tablet, Rfl: 3    gabapentin (NEURONTIN) 100 MG capsule, Take 1-4 capsules by oral route once nightly 2 hours before bedtime. Start with 1 capsule (100mg ) and increased by 1 capsule every 7 days up to max 4 capsules (400mg ) once nightly, Disp: 60 capsule, Rfl: 1    Mefenamic Acid 250 MG CAPS, Take 250 mg by mouth as needed (Menstrual cramps). Take 500 on day one then 250 daily. not to exceed one week duration at a time, Disp: 28 capsule, Rfl: 0    Melatonin 1 MG CAPS, Take 1 tablet by mouth nightly., Disp: 90 capsule, Rfl: 0    Multiple Vitamin (MULTIVITAMIN) capsule, Take 1 capsule by mouth daily., Disp: 30 tablet, Rfl: 0    mycoPHENOLate mofetil (CELLCEPT) 500 MG tablet, TAKE 2 TABLETS BY MOUTH 2 TIMES DAILY, Disp: 360 tablet, Rfl: 3    valACYclovir (VALTREX) 500 MG tablet, TAKE 1 TABLET BY MOUTH EVERY DAY, Disp: 90 tablet, Rfl: 3    Allergies:  Allergies   Allergen Reactions    Ivig [Other] Other     Lymph nodes swollen        FAMILY HISTORY:  Family History   Problem Relation Name Age of Onset    Diabetes Mother          HTN, DJD    Neurological Disease (Parkinsons/Huntingtons/Huntingtons Chorea) Father          DM, alcoholism    Diabetes Father      No Known Problems Sister      Thyroid Brother      Cataract M Grandfather      Cataract P Grandfather  SOCIAL HISTORY:      reports that she has never smoked. She has never used smokeless tobacco. She reports  that she does not currently use alcohol. She reports that she does not use drugs.    Review of Systems:  Review of Systems    Objective:     BP 102/75 (BP Location: Left arm, BP Patient Position: Sitting, BP cuff size: Regular)   Pulse 91   Temp 97.9 F (36.6 C) (Oral)   Resp 16   Ht 5' (1.524 m)   Wt 90.3 kg (199 lb)   LMP 10/15/2022   SpO2 100%   BMI 38.86 kg/m     Physical Exam:  Physical Exam  Vitals reviewed.   Constitutional:       Appearance: She is obese.   Eyes:      Extraocular Movements: Extraocular movements intact.      Conjunctiva/sclera: Conjunctivae normal.   Cardiovascular:      Rate and Rhythm: Normal rate and regular rhythm.   Pulmonary:      Effort: Pulmonary effort is normal.   Neurological:      Mental Status: She is alert.   Psychiatric:         Mood and Affect: Mood normal.         Labs and Imaging:  Lab Results   Component Value Date    HGB 13.0 04/23/2022    SODIUM 140 04/23/2022    K 4.7 04/23/2022    CREAT 0.7 04/23/2022    GFR >60 04/23/2022    AST 19 04/23/2022    ALT 13 04/23/2022    TBILI 0.3 04/23/2022    A1C 5.4 04/23/2022    CHOL 170 04/23/2022    TRIG 261 (H) 04/23/2022         Assessment & Plan:     Anita Turner was seen today for follow up.    Diagnoses and all orders for this visit:    Insomnia, unspecified type  Chronic and working with sleep medicine for management.    Class 2 severe obesity due to excess calories with serious comorbidity and body mass index (BMI) of 38.0 to 38.9 in adult (CMS-HCC)  Chronic and currently working on diet, activity and medical management.    B12 deficiency  Chronic, will order labs and follow up as needed.  -     Vitamin B12, Blood Green Plasma Separator Tube; Future    Need for vaccination  Will get at another visit.  -     COVID-19 [facility]) Vaccine 12+ IM, 2023-24 (Future Admin); Future  -     shingles [facility]) - Admin Now; Future  -     shingles [facility]) - Admin in 2-6 Months; Future        Routine Health Maintenance:    Health  Maintenance   Topic Date Due    Hepatitis C Screening  Never done    Shingles Vaccine (1 of 2) 11/05/2023 (Originally 12/13/1991)    COVID-19 Vaccine (4 - 2023-24 season) 11/05/2023 (Originally 04/04/2022)    PHQ9 Depression Monitoring ([place])  12/10/2022    Breast Cancer Screen  07/08/2023    Annual AUDIT Screen  10/05/2023    Annual DAST Screen  10/05/2023    Cervical Cancer Screening  10/05/2026    Tetanus (2 - Td or Tdap) 03/30/2031    Colorectal Cancer Screening  01/09/2032    Influenza  Completed    Universal HIV Screening  Completed    Pneumococcal Vaccine  Completed  IMM_Hep A Vaccine Series  Aged Out    HPV Vaccine <= 79 Yrs  Aged Out    Meningococcal MCV4 Vaccine  Aged Out       Requested Prescriptions      No prescriptions requested or ordered in this encounter       Follow Up: Return for for annual physical exam with labs.    Future Appointments   Date Time Provider Department Center   11/05/2022  1:00 PM [name], MD [facility] [place]   12/11/2022 10:40 AM Karma Greaser, MD [facility]

## 2022-11-04 NOTE — Patient Instructions (Signed)
It was nice to see you in my clinic today. Thank you for allowing me to take part in your care. If I may be of any further assistance, please do not hesitate to contact [phone]. If you have not done so already, please contact the front desk on your way out for information on secure health messaging to communicate with me electronically. For your health, it is important that you have the following done in a timely manner.    # Please schedule an appointment with Dr. Herbert Moors or your regular Primary Care Provider at the front desk for annual physical exam with labs. If you have any new or emergent concerns, please seek medical attention immediately or call 911. Please arrive to your appointment at least 20 minutes prior in order to check in and fill out paperwork.    # As we discussed, please work on diet and exercise as tolerated.     # You will be receiving a call and email regarding a survey on the service you received today. Please take the one-minute survey to help [place] understand your experience and to continue providing you with great service in the future.    Sincerely,    Herbert Moors, MD  [facility], [place]  [phone]

## 2022-11-05 ENCOUNTER — Encounter: Payer: Self-pay | Admitting: Internal Medicine

## 2022-11-05 ENCOUNTER — Ambulatory Visit (INDEPENDENT_AMBULATORY_CARE_PROVIDER_SITE_OTHER): Payer: Commercial Managed Care - PPO | Admitting: Internal Medicine

## 2022-11-05 ENCOUNTER — Ambulatory Visit: Payer: Commercial Managed Care - PPO | Admitting: Nurse Practitioner

## 2022-11-05 VITALS — BP 102/75 | HR 91 | Temp 97.9°F | Resp 16 | Ht 60.0 in | Wt 199.0 lb

## 2022-11-05 DIAGNOSIS — Z6838 Body mass index (BMI) 38.0-38.9, adult: Secondary | ICD-10-CM

## 2022-11-05 DIAGNOSIS — G4700 Insomnia, unspecified: Secondary | ICD-10-CM

## 2022-11-05 DIAGNOSIS — E538 Deficiency of other specified B group vitamins: Secondary | ICD-10-CM

## 2022-11-05 DIAGNOSIS — Z23 Encounter for immunization: Secondary | ICD-10-CM

## 2022-11-05 NOTE — Interdisciplinary (Signed)
Anita Turner is a 52 year old female here for a sleep follow up.   Pt declined covid booster and shingles vaccines

## 2022-11-13 ENCOUNTER — Encounter: Payer: No Typology Code available for payment source | Admitting: Internal Medicine

## 2022-11-14 NOTE — Progress Notes (Deleted)
Patient: Anita Turner  DOB: 12-May-1971  Primary Care Provider: Ricardo Jericho    Subjective:      Anita Turner is a 52 year old Not Hispanic, Latino(a), or Spanish origin female who presents today for No chief complaint on file.      Current Concerns/Interval History:     #Annual physical exam:  Patient is generally feeling well and is here for their annual physical exam with labs.      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)    Other specified congenital anomaly of skin    Current use of steroid medication    Recurrent HSV (herpes simplex virus)    Papanicolaou smear of cervix with low grade squamous intraepithelial lesion (LGSIL)    Anxiety and depression    Mixed hyperlipidemia    Low HDL (under 40)       Past Surgical History:  Past Surgical History:   Procedure Laterality Date    CHOLECYSTECTOMY  2017    APPENDECTOMY  2002       Medications:    Current Outpatient Medications:     ALPRAZolam (XANAX) 0.5 MG tablet, Take 1 tablet (0.5 mg) by mouth nightly as needed for Insomnia., Disp: 30 tablet, Rfl: 2    baclofen (LIORESAL) 10 MG tablet, Take 1 tablet (10 mg) by mouth every 8 hours as needed (muscle spasm)., Disp: 30 tablet, Rfl: 0    buPROPion (WELLBUTRIN XL) 150 MG XL tablet, Take 1 tablet (150 mg) by mouth every morning., Disp: 90 tablet, Rfl: 3    gabapentin (NEURONTIN) 100 MG capsule, Take 1-4 capsules by oral route once nightly 2 hours before bedtime.  Start with 1 capsule ( ) and increased by 1 capsule every 7 days up to max 4 capsules ( ) once nightly, Disp: 60 capsule, Rfl: 1    Multiple Vitamin (MULTIVITAMIN) capsule, Take 1 capsule by mouth daily., Disp: 30 tablet, Rfl: 0    mycoPHENOLate mofetil (CELLCEPT) 500 MG tablet, TAKE 2 TABLETS BY MOUTH 2 TIMES DAILY, Disp: 360 tablet, Rfl: 3    valACYclovir (VALTREX) 500 MG tablet, TAKE 1 TABLET BY MOUTH EVERY DAY, Disp: 90 tablet, Rfl: 3    Allergies:  Allergies   Allergen Reactions    Ivig [Other] Other     Lymph nodes swollen        FAMILY HISTORY:  Family History   Problem Relation Name Age of Onset    Diabetes Mother          HTN, DJD    Neurological Disease (Parkinsons/Huntingtons/Huntingtons Chorea) Father          DM, alcoholism    Diabetes Father      No Known Problems Sister      Thyroid Brother  Cataract M Grandfather      Cataract P Grandfather         SOCIAL HISTORY:      reports that she has never smoked. She has never used smokeless tobacco. She reports that she does not currently use alcohol. She reports that she does not use drugs.    Review of Systems:  Review of Systems    Objective:     LMP 10/15/2022     Physical Exam:  Physical Exam    Labs and Imaging:  Lab Results   Component Value Date    HGB 13.0 04/23/2022    SODIUM 140 04/23/2022    K 4.7 04/23/2022    CREAT 0.7 04/23/2022    GFR >60 04/23/2022    AST 19 04/23/2022    ALT 13 04/23/2022    TBILI 0.3 04/23/2022    A1C 5.4 04/23/2022    CHOL 170 04/23/2022    TRIG 261 (H) 04/23/2022         Assessment & Plan:     There are no diagnoses linked to this encounter.        Routine Health Maintenance:  The 10-year ASCVD risk score (Arnett DK, et al., 2019) is: 0.9%    Values used to calculate the score:      Age: 45 years      Sex: Female      Is Non-Hispanic African American: No      Diabetic: No      Tobacco smoker: No      Systolic Blood Pressure: 102 mmHg      Is BP treated: No      HDL Cholesterol: 38 MG/DL      Total Cholesterol:  170 MG/DL    Health Maintenance   Topic Date Due    Hepatitis C Screening  Never done    PHQ9 Depression Monitoring ([place])  12/10/2022    Shingles Vaccine (1 of 2) 11/05/2023 (Originally 12/13/1991)    COVID-19 Vaccine (4 - 2023-24 season) 11/05/2023 (Originally 04/04/2022)    Breast Cancer Screen  07/08/2023    Annual AUDIT Screen  10/05/2023    Annual DAST Screen  10/05/2023    Cervical Cancer Screening  10/05/2026    Tetanus (2 - Td or Tdap) 03/30/2031    Colorectal Cancer Screening  01/09/2032    Influenza  Completed    Universal HIV Screening  Completed    Pneumococcal Vaccine  Completed    IMM_Hep A Vaccine Series  Aged Out    HPV Vaccine <= 49 Yrs  Aged Out    Meningococcal MCV4 Vaccine  Aged Out       Requested Prescriptions      No prescriptions requested or ordered in this encounter       Follow Up: No follow-ups on file.    Future Appointments   Date Time Provider Department Center   11/18/2022  3:00 PM [name], MD [facility] [place]   12/11/2022 10:40 AM Karma Greaser, MD [facility]

## 2022-11-18 ENCOUNTER — Encounter: Payer: No Typology Code available for payment source | Admitting: Internal Medicine

## 2022-11-28 ENCOUNTER — Encounter: Payer: No Typology Code available for payment source | Admitting: Internal Medicine

## 2022-12-03 ENCOUNTER — Telehealth: Payer: Self-pay | Admitting: Internal Medicine

## 2022-12-03 NOTE — Telephone Encounter (Signed)
Returned patients call.  Earlier appt scheduled.

## 2022-12-03 NOTE — Telephone Encounter (Signed)
Pt requesting sooner appointment.     Reason for sooner appointment:  Patient has a medical concern (not triage related) and needs a sooner appointment    Tentatively scheduled with Provider: Dr Merlinda Frederick  at Date/Time: 5/10 at 10:40AM  Location: [place]     Please assist.

## 2022-12-04 ENCOUNTER — Encounter: Payer: Commercial Managed Care - PPO | Admitting: Internal Medicine

## 2022-12-10 NOTE — Progress Notes (Signed)
Department of Dermatology  [facility]  PATIENT: Anita Turner  MRN: [id number]  DOB: 10/02/1970  DATE OF SERVICE: 12/11/2022    DERMATOLOGY FOLLOW-UP PATIENT VISIT     REFERRING PRACTITIONER: Karma Greaser  PRIMARY CARE PROVIDER: Herbert Moors A  CHIEF COMPLAINT:   Chief Complaint   Patient presents with    Follow Up     Subjective:     Anita Turner is a 52 year old female who was seen in clinic for f/u pemphigus vulgaris and pemphigus foliaceous. Has been taking Cellcept 1000 mg daily. No issues with this. Notes dry mouth but no oral lesions. Recently notes gum sensitivity while brushing her teeth but denies any blood or soreness. No issues with spicy or sharp foods. Has not yet completed blood work but plans to do so soon.     Also here for skin check today. Has a bump on her left back, asymptomatic. Notes a few dark spots on her left lower legs that occasionally get nicked while shaving.     Objective:   General: Pleasant, in no acute distress, appears stated age  Respiratory: No increased work of breathing    There were no vitals filed for this visit.    Skin Exam:  Pertinent skin findings below:  Skin exam performed including scalp, face, conjunctiva, lips, oral mucosa, neck, chest, back, abdomen, right upper, left upper, right lower, left lower extremity, hands, feet, nails, buttocks and groin and the following was noted:    No erosions or ulcerations, no gingival erythema  Stable gingival hyperpigmentation   Scattered brown regular macules and papules with reticular or globular pigment network under dermoscopy on trunk and b/l UE  Multiple brown regular macules with moth-eaten borders on dorsal forearms, face, chest  Scattered waxy tan to brown papules with comedo-like openings on torso   Left lower legs with scarred papules with positive dimple signs   Regular brown papule on right buttock with multiple colors   Numerous pedunculated papules on axillae as well as inframammary and inguinal  folds    02/07/2019   Basement Membrane Zone (BMZ) IgG, IgG4, and IgA  IgG:  Negative, monkey esophagus substrate        Negative, human split skin substrate      IgG4:  Negative, monkey esophagus substrate             Negative, human split skin substrate   IgA:  Negative, monkey esophagus substrate        Negative, human split skin substrate   Positive IgG, including IgG4, cell surface antibodies,                monkey esophagus substrate      Enzyme Linked Immunosorbent Assay (ELISA)   --------------------------------------------------   Bullous Pemphigoid (BP) 180 and 230 IgG Antibodies   IgG BP 180 antibodies:  1 unit  NEGATIVE  IgG BP 230 antibodies:  1 unit  NEGATIVE     02/07/2019  Cell Surface IgG Antibodies   IgG:  Positive, titer 1:2560 (H), monkey esophagus substrate        Positive, titer 1:640 (H), intact human skin substrate      Enzyme-Linked Immunosorbent Assay (ELISA)  -----------------------------------------  Desmoglein (DSG) 1 and 3 IgG Antibodies   IgG desmoglein 1 antibodies:   7 units  NEGATIVE       Reference Range:          Positive (H) = Greater than 20 units  Borderline/Indeterminate = 14-20 units          Negative = Less than 14 units   IgG desmoglein 3 antibodies: 320 units (H)  POSITIVE         (Initial level, 139 units, greater than high                calibrator; diluted to achieve 64 units, within          assay calibrators, and multiplied by the dilution          factor of 5)   COMMENTS   Specific   -------------------------------------------------   These indirect immunofluorescence results, demonstrating   positive IgG cell surface antibody reactivity on both   monkey esophagus and intact human skin substrates, support   the diagnosis of pemphigus.    Assessment/Plan     # Pemphigus vulgaris  # Medication monitoring  Xerostomia resolved with resumption of cellcept.  Currently well-controlled with no flares  Not on prednisone.  Patient previously followed by Dr. Roetta Sessions and  prior [facility] including serologies for pemphigus/pemphigoid, CBC, CMP. She was previously on rituxan. Was also on IVIg monthly but stopped in November 2021 due to difficulty tolerating infusions.      - Continue cellcept 1000 mg daily   - As well-controlled at this time, will hold off on adding additional therapies  - Have labs drawn: CBC and CMP (quest), and every 3-4 months, ordered last vsiit  - Continue B12 replacement   - Continue NCN 500 mg tid   - Follow-up with Dr. Berniece Andreas and Naturopath    # Melanocytic nevi, trunk and extremities   - Continue regular skin exams  - Continue with sun protection and sun avoidance   - Clinically without concerning features on exam today but the patient is to return to clinic for any changing skin lesions or skin concerns    # Seborrheic keratoses  # Dermatofibromas   # Acrochordons   - Clinically without concerning features on exam today but the patient is to return to clinic for any changing skin lesions or skin concerns    # Solar lentigines  - Counseled that these are related to actinic damage  - Continue with sun protection and sun avoidance   - Clinically without concerning features on exam today but the patient is to return to clinic for any changing skin lesions or skin concerns    # EIC, left back   - No concerning features on exam today   - Notify me if becomes symptomatic    The above plan of care, diagnosis, orders, and follow-up were discussed with the patient. Risks, benefits, alternatives to therapy were discussed. Questions related to this recommended plan of care were answered.    RTC 3-4 months or sooner prn    Scribe Attestation  The notes I am recording reflect only actions made by and judgments taken by this provider, Dr. Dorma Russell, for whom I am scribing today.  I have performed no independent clinical work.    Darrin Nipper    Provider Attestation for Scribed Note    As the attending provider, I agree with the scribed content.  Any changes or edits are  noted in the text above.     Karma Greaser

## 2022-12-11 ENCOUNTER — Ambulatory Visit: Payer: No Typology Code available for payment source | Admitting: Dermatology

## 2022-12-11 DIAGNOSIS — L821 Other seborrheic keratosis: Secondary | ICD-10-CM

## 2022-12-11 DIAGNOSIS — L720 Epidermal cyst: Secondary | ICD-10-CM

## 2022-12-11 DIAGNOSIS — L918 Other hypertrophic disorders of the skin: Secondary | ICD-10-CM

## 2022-12-11 DIAGNOSIS — D225 Melanocytic nevi of trunk: Secondary | ICD-10-CM

## 2022-12-11 DIAGNOSIS — D239 Other benign neoplasm of skin, unspecified: Secondary | ICD-10-CM

## 2022-12-11 DIAGNOSIS — Z79899 Other long term (current) drug therapy: Secondary | ICD-10-CM

## 2022-12-11 DIAGNOSIS — L100 Pemphigus vulgaris: Secondary | ICD-10-CM

## 2022-12-11 NOTE — Patient Instructions (Signed)
It was nice to see you in my clinic today. Thank you for allowing me to take part in your care. If I may be of any further assistance, please do not hesitate to contact [phone]. If you have not done so already, please contact the front desk on your way out for information on secure health messaging to communicate with me electronically. For your health, it is important that you have the following done in a timely manner.    # Please schedule an appointment with Dr. Henri Baumler or your regular Primary Care Provider at the front desk in 2 weeks to follow up on labs and recommendations. If you have any new or emergent concerns, please seek medical attention immediately or call 911. Please arrive to your appointment 30 minutes prior in order to check in and fill out paperwork.    # As we discussed, please work on diet and exercise as tolerated.     # You were given a lab slip to have your blood tests done. Please have them done fasting.    # You will be receiving a call and email regarding a survey on the service you received today. Please take the one-minute survey to help us understand your experience and to continue providing you with great service in the future.    Sincerely,    Desean Heemstra, MD  [facility], [place]  [phone]

## 2022-12-11 NOTE — Progress Notes (Signed)
Patient: Anita Turner  DOB: 05/21/1971  Primary Care Provider: Herbert Moors A    Subjective:      Anita Turner is a 52 year old Not Hispanic, Latino(a), or Spanish origin female who presents today for Physical      Current Concerns/Interval History:     #Annual physical exam:  Patient is generally feeling well and is here for their annual physical exam with labs.        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)    Other specified congenital anomaly of skin    Current use of steroid medication    Recurrent HSV (herpes simplex virus)    Papanicolaou smear of cervix with low grade squamous intraepithelial lesion (LGSIL)    Anxiety and depression    Mixed hyperlipidemia    Low HDL (under 40)       Past Surgical History:  Past Surgical History:   Procedure Laterality Date    CHOLECYSTECTOMY  2017    APPENDECTOMY  2002       Medications:    Current Outpatient Medications:     ALPRAZolam (XANAX) 0.5 MG tablet, Take 1 tablet (0.5 mg) by mouth nightly as needed for Insomnia., Disp: 30 tablet, Rfl: 2    baclofen (LIORESAL) 10 MG tablet, Take 1 tablet (10 mg) by mouth every 8 hours as needed (muscle spasm)., Disp: 30 tablet, Rfl: 0    buPROPion (WELLBUTRIN XL) 150 MG XL tablet, Take 1 tablet (150 mg) by mouth every morning., Disp: 90 tablet, Rfl: 3    gabapentin (NEURONTIN) 100 MG capsule, Take 1-4 capsules by oral route once nightly 2 hours before bedtime. Start with 1 capsule (100mg ) and  increased by 1 capsule every 7 days up to max 4 capsules (400mg ) once nightly, Disp: 60 capsule, Rfl: 1    Multiple Vitamin (MULTIVITAMIN) capsule, Take 1 capsule by mouth daily., Disp: 30 tablet, Rfl: 0    mycoPHENOLate mofetil (CELLCEPT) 500 MG tablet, TAKE 2 TABLETS BY MOUTH 2 TIMES DAILY, Disp: 360 tablet, Rfl: 3    Naltrexone HCl, Pain, (NALTREX PO), , Disp: , Rfl:     valACYclovir (VALTREX) 500 MG tablet, TAKE 1 TABLET BY MOUTH EVERY DAY, Disp: 90 tablet, Rfl: 3    Allergies:  Allergies   Allergen Reactions    Ivig [Other] Other     Lymph nodes swollen        FAMILY HISTORY:  Family History   Problem Relation Name Age of Onset    Diabetes Mother          HTN, DJD    Neurological Disease (Parkinsons/Huntingtons/Huntingtons Chorea) Father          DM, alcoholism    Diabetes Father      No Known Problems Sister      Thyroid Brother  Cataract M Grandfather      Cataract P Grandfather         SOCIAL HISTORY:      reports that she has never smoked. She has never used smokeless tobacco. She reports that she does not currently use alcohol. She reports that she does not use drugs.    Review of Systems:  Review of Systems    Objective:     BP 113/65   Pulse 77   Temp 97.4 F (36.3 C) (Oral)   Resp 18   Ht 5' (1.524 m)   Wt 89.5 kg (197 lb 4 oz)   SpO2 99%   BMI 38.52 kg/m     Physical Exam:  Physical Exam  Vitals reviewed.   Constitutional:       Appearance: Normal appearance. She is well-developed.   HENT:      Head: Normocephalic.      Right Ear: Tympanic membrane, ear canal and external ear normal.      Left Ear: Tympanic membrane, ear canal and external ear normal.      Nose: Nose normal.      Mouth/Throat:      Mouth: Mucous membranes are moist.      Pharynx: Oropharynx is clear.   Eyes:      Extraocular Movements: Extraocular movements intact.      Conjunctiva/sclera: Conjunctivae normal.      Pupils: Pupils are equal, round, and reactive to light.   Cardiovascular:      Rate and Rhythm: Normal rate  and regular rhythm.      Heart sounds: Normal heart sounds. No murmur heard.  Pulmonary:      Effort: Pulmonary effort is normal. No respiratory distress.      Breath sounds: Normal breath sounds. No wheezing.   Abdominal:      General: Abdomen is flat. Bowel sounds are normal. There is no distension.      Palpations: Abdomen is soft.      Tenderness: There is no abdominal tenderness.   Musculoskeletal:         General: Normal range of motion.      Cervical back: Normal range of motion and neck supple. No rigidity or tenderness.      Right lower leg: No edema.      Left lower leg: No edema.   Lymphadenopathy:      Cervical: No cervical adenopathy.   Skin:     General: Skin is warm and dry.   Neurological:      General: No focal deficit present.      Mental Status: She is alert and oriented to person, place, and time.      Gait: Gait normal.      Deep Tendon Reflexes: Reflexes normal.   Psychiatric:         Mood and Affect: Mood normal.         Behavior: Behavior normal.         Thought Content: Thought content normal.         Judgment: Judgment normal.         Labs and Imaging:  Lab Results   Component Value Date    HGB 13.0 04/23/2022    SODIUM 140 04/23/2022    K 4.7 04/23/2022    CREAT 0.7 04/23/2022    GFR >60 04/23/2022    AST 19 04/23/2022    ALT 13 04/23/2022    TBILI 0.3 04/23/2022    A1C 5.4 04/23/2022  CHOL 170 04/23/2022    TRIG 261 (H) 04/23/2022         Assessment & Plan:     Anita Turner was seen today for physical.    Diagnoses and all orders for this visit:    Encounter for annual physical exam  -     Lipid Panel Green Plasma Separator Tube; Future  -     CBC w/ Diff Lavender; Future  -     Comprehensive Metabolic Panel; Future  -     Vitamin D, 25-OH Total Yellow serum separator tube; Future  -     TSH, Blood; Future  -     Hepatitis C Antibody; Future  -     Glycosylated Hgb(A1C), Blood Lavender; Future  -     Hepatitis B Surface Ab, Blood; Future  -     Hepatitis A Total Antibody; Future  -     Vitamin  B12, Blood Green Plasma Separator Tube; Future  -     Lipid Panel Green Plasma Separator Tube  -     CBC w/ Diff Lavender  -     Comprehensive Metabolic Panel  -     Vitamin D, 25-OH Total Yellow serum separator tube  -     TSH, Blood  -     Hepatitis C Antibody  -     Glycosylated Hgb(A1C), Blood Lavender  -     Hepatitis B Surface Ab, Blood  -     Hepatitis A Total Antibody  -     Vitamin B12, Blood Green Plasma Separator Tube    Other orders  -     Naltrexone HCl, Pain, 4.5 MG CAPS; Take 2 tablets by mouth daily.            Routine Health Maintenance:  The 10-year ASCVD risk score (Arnett DK, et al., 2019) is: 1.1%    Values used to calculate the score:      Age: 67 years      Sex: Female      Is Non-Hispanic African American: No      Diabetic: No      Tobacco smoker: No      Systolic Blood Pressure: 113 mmHg      Is BP treated: No      HDL Cholesterol: 38 MG/DL      Total Cholesterol: 170 MG/DL    Health Maintenance   Topic Date Due    Hepatitis C Screening  Never done    PHQ9 Depression Monitoring ([place])  12/10/2022    Shingles Vaccine (1 of 2) 11/05/2023 (Originally 12/13/1991)    COVID-19 Vaccine (4 - 2023-24 season) 11/05/2023 (Originally 04/04/2022)    Breast Cancer Screen  07/08/2023    Annual AUDIT Screen  10/05/2023    Annual DAST Screen  10/05/2023    Cervical Cancer Screening  10/05/2026    Tetanus (2 - Td or Tdap) 03/30/2031    Colorectal Cancer Screening  01/09/2032    Influenza  Completed    Universal HIV Screening  Completed    Pneumococcal Vaccine  Completed    IMM_Hep A Vaccine Series  Aged Out    HPV Vaccine <= 46 Yrs  Aged Out    Meningococcal MCV4 Vaccine  Aged Out       Requested Prescriptions      No prescriptions requested or ordered in this encounter       Follow Up: No follow-ups on file.    Future Appointments  Date Time Provider Department Center   12/12/2022 10:40 AM [name], MD [facility] [place]   05/14/2023  1:00 PM Karma Greaser, MD [facility]

## 2022-12-12 ENCOUNTER — Ambulatory Visit (INDEPENDENT_AMBULATORY_CARE_PROVIDER_SITE_OTHER): Payer: No Typology Code available for payment source | Admitting: Internal Medicine

## 2022-12-12 VITALS — BP 113/65 | HR 77 | Temp 97.4°F | Resp 18 | Ht 60.0 in | Wt 197.2 lb

## 2022-12-12 DIAGNOSIS — Z0000 Encounter for general adult medical examination without abnormal findings: Secondary | ICD-10-CM

## 2022-12-12 MED ORDER — NALTREX PO
ORAL | Status: DC
Start: ? — End: 2022-12-12

## 2022-12-12 MED ORDER — NALTREXONE HCL (PAIN) 4.5 MG PO CAPS
2.0000 | ORAL_CAPSULE | Freq: Every day | ORAL | 0 refills | Status: DC
Start: 2022-12-12 — End: 2023-01-13

## 2022-12-12 NOTE — Interdisciplinary (Signed)
Patient is here today for annual physical.

## 2022-12-14 ENCOUNTER — Encounter: Payer: Self-pay | Admitting: Dermatology

## 2022-12-19 ENCOUNTER — Encounter: Payer: Self-pay | Admitting: Internal Medicine

## 2022-12-19 ENCOUNTER — Encounter: Payer: Self-pay | Admitting: Dermatology

## 2022-12-19 NOTE — Telephone Encounter (Signed)
From: Rayann Heman  To: [name]  Sent: 12/19/2022 11:54 AM PDT  Subject: Test results     Good morning Doctor, I have given my blood work few days back haven't hair my results on my chart checking if you have received them from [facility], please let me know   Thank you   Padma

## 2022-12-20 LAB — CBC WITH DIFF, BLOOD
Abs Basophils: 97 cells/uL (ref 0–200)
Abs Eosinophils: 220 cells/uL (ref 15–500)
Abs Lymphs: 2552 cells/uL (ref 850–3900)
Abs Monocytes: 554 cells/uL (ref 200–950)
Abs Neutrophils: 5377 cells/uL (ref 1500–7800)
Basophils: 1.1 %
Eosinophils: 2.5 %
HCT: 41.9 % (ref 35.0–45.0)
HGB: 14.6 g/dL (ref 11.7–15.5)
Lymps: 29 %
MCH: 30.5 pg (ref 27.0–33.0)
MCHC: 34.8 g/dL (ref 32.0–36.0)
MCV: 87.5 fL (ref 80.0–100.0)
MPV: 9.9 fL (ref 7.5–12.5)
Monocytes: 6.3 %
PLT: 393 10*3/uL (ref 140–400)
RBC: 4.79 10*6/uL (ref 3.80–5.10)
RDW: 12.8 % (ref 11.0–15.0)
SEGS: 61.1 %
WBC: 8.8 10*3/uL (ref 3.8–10.8)

## 2022-12-20 LAB — COMPREHENSIVE METABOLIC PANEL, BLOOD
ALT (SGPT): 15 U/L (ref 6–29)
AST (SGOT): 17 U/L (ref 10–35)
Albumin/Glob Ratio: 1.8 (calc) (ref 1.0–2.5)
Albumin: 4.3 g/dL (ref 3.6–5.1)
Alkaline Phos: 101 U/L (ref 37–153)
BUN: 8 mg/dL (ref 7–25)
Bilirubin, Total: 0.5 mg/dL (ref 0.2–1.2)
Calcium: 9 mg/dL (ref 8.6–10.4)
Carbon Dioxide: 28 mmol/L (ref 20–32)
Chloride: 103 mmol/L (ref 98–110)
Creatinine: 0.74 mg/dL (ref 0.50–1.03)
EGFR: 99 mL/min/{1.73_m2} (ref >=60)
Globulin: 2.4 g/dL (calc) (ref 1.9–3.7)
Glucose: 86 mg/dL (ref 65–99)
Potassium: 4.8 mmol/L (ref 3.5–5.3)
Sodium: 138 mmol/L (ref 135–146)
Total Protein: 6.7 g/dL (ref 6.1–8.1)

## 2022-12-20 LAB — HDL-CHOLESTEROL, BLOOD: HDL Cholesterol: 44 mg/dL — ABNORMAL LOW (ref >=50)

## 2022-12-20 LAB — LDL CHOLESTEROL, DIRECT: LDL-Cholesterol: 111 mg/dL (calc) — ABNORMAL HIGH

## 2022-12-20 LAB — TSH, BLOOD: TSH: 2.21 mIU/L

## 2022-12-20 LAB — NON HDL CHOLESTEROL -QUEST: Non-HDL Cholesterol: 139 mg/dL (calc) — ABNORMAL HIGH (ref <130)

## 2022-12-20 LAB — HEPATITIS C AB, BLOOD: Hepatitis C Ab: NONREACTIVE

## 2022-12-20 LAB — VITAMIN B12, BLOOD: Vitamin B12: 336 pg/mL (ref 200–1100)

## 2022-12-20 LAB — TRIGLYCERIDES, BLOOD: Triglycerides: 166 mg/dL — ABNORMAL HIGH (ref <150)

## 2022-12-20 LAB — CHOLESTEROL, TOTAL BLOOD: Cholesterol: 183 mg/dL (ref <200)

## 2022-12-20 LAB — CHOLESTEROL/HDLC RATIO-QUEST: Chol/HDLC Ratio: 4.2 (calc) (ref <5.0)

## 2022-12-20 LAB — HEPATITIS B SURFACE AB, BLOOD: Hepatitis B Surface Ab: NONREACTIVE

## 2022-12-20 LAB — GLYCOSYLATED HGB(A1C), BLOOD: Hgb A1C: 5.6 % of total Hgb (ref <5.7)

## 2022-12-20 LAB — VITAMIN D, 25-OH TOTAL: Vitamin D, 25-OH, Total: 51 ng/mL (ref 30–100)

## 2022-12-20 LAB — HEP A TOTAL ANTIBODY: Hepatitis A Ab, Total: REACTIVE — AB

## 2022-12-25 ENCOUNTER — Ambulatory Visit: Payer: No Typology Code available for payment source | Admitting: Internal Medicine

## 2022-12-25 ENCOUNTER — Encounter: Payer: Self-pay | Admitting: Internal Medicine

## 2022-12-25 VITALS — BP 129/64 | HR 67 | Temp 98.6°F | Resp 18 | Ht 60.0 in | Wt 200.1 lb

## 2022-12-25 DIAGNOSIS — M545 Low back pain, unspecified: Secondary | ICD-10-CM

## 2022-12-25 DIAGNOSIS — Z0000 Encounter for general adult medical examination without abnormal findings: Secondary | ICD-10-CM

## 2022-12-25 LAB — URINALYSIS 8, POINT OF CARE TESTING
Bld, UA POCT: NEGATIVE
Glucose, UA POCT: NEGATIVE mg/dL
Ketone, UA POCT: NEGATIVE mg/dL
Nitrite, UA POCT: NEGATIVE
Protein, UA POCT: NEGATIVE mg/dL
Specific Gravity, UA (POCT): 1.01 (ref 1.003–1.030)
pH, UA POCT: 5.5 (ref 5.0–8.0)

## 2022-12-25 MED ORDER — BACLOFEN 10 MG OR TABS
10.0000 mg | ORAL_TABLET | Freq: Three times a day (TID) | ORAL | 0 refills | Status: DC
Start: 2022-12-25 — End: 2023-11-12

## 2022-12-25 NOTE — Progress Notes (Signed)
SAME DAY  Patient: Anita Turner  DOB: 1971/01/28  Primary Care Provider: Herbert Moors A  Follow up-Labs    Subjective:      Anita Turner is a 52 year old Not Hispanic, Latino(a), or Spanish origin female who presents today for Follow Up (Lab results) and Back Pain (Upper back)      Current Concerns/Interval History:     Patient evaluated on 12/12/2022 for an annual physical exam with labs and is here today to follow up on those labs and to discuss recommendations.    #Labs:  CMP: Within normal range  Lipid: Within normal range  Vit D: Within normal range  CBC: Within normal range  Hep C: nonreacitve:  Hep A: reactive, immunity established  A1c: Within normal range  TSH: Within normal range    03/27/2022:   #Back pain:  Notes that she has abdominal and back pain with her periods.  It resolves with her periods.  The abdominal cramping is normal for her during her periods, but she is also having it between her periods.  She is concerned because she has a history of kidney stones.  She had her IUD removed about one month ago.  Recently had ultrasound that showed thickened endometrium and fibroids. Has appointment for endometrial biopsy.  She has talked with her gyne about possible   Colonoscopy was done recently and negative either than for hemorrhoids.  The back pain pain is usually constant, dull, in her lower back.  She is taking Motrin 200 mg in morning and 200 at night.     Acute left-sided low back pain without sciatica  Lower back pain present with abdominal pain during an between periods and improves slightly with low dose ibuprofen and heating pad.  Recommended increasing ibuprofen to 600 mg, 3 times a day, and follow up as needed.  If pain worsens or does not improve, consider physical therapy.     04/11/2022:  She notes that the pain has improved but worse with the beginning of her periods.  Has been given Menamic Acid in the past for menstural cramps and works well, would like a refill today.  Acute  left-sided low back pain without sciatica  -     Sedimentation Rate (ESR), Blood Lavender; Future  -     Urinalysis with Culture Reflex, when indicated; Future  -     [facility]; Future      Today:  She is having lower left back pain that presented in the morning for the past 4 days, take ibuprofen, lasts about one hours. Does not feel like her previous back/cramping pain.        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)    Other specified congenital anomaly of skin    Current use of steroid medication  Recurrent HSV (herpes simplex virus)    Papanicolaou smear of cervix with low grade squamous intraepithelial lesion (LGSIL)    Anxiety and depression    Mixed hyperlipidemia    Low HDL (under 40)       Past Surgical History:  Past Surgical History:   Procedure Laterality Date    CHOLECYSTECTOMY  2017    APPENDECTOMY  2002       Medications:    Current Outpatient Medications:     ALPRAZolam (XANAX) 0.5 MG tablet, Take 1 tablet (0.5 mg) by mouth nightly as needed for Insomnia., Disp: 30 tablet, Rfl: 2    baclofen (LIORESAL) 10 MG tablet, Take 1 tablet (10 mg) by mouth every 8 hours as needed (muscle spasm)., Disp: 30 tablet, Rfl: 0    buPROPion (WELLBUTRIN XL) 150 MG XL tablet, Take 1 tablet (150 mg) by mouth every morning., Disp: 90 tablet, Rfl: 3    gabapentin (NEURONTIN) 100 MG capsule, Take 1-4 capsules by oral route once nightly 2 hours before bedtime. Start with 1 capsule  (100mg ) and increased by 1 capsule every 7 days up to max 4 capsules (400mg ) once nightly, Disp: 60 capsule, Rfl: 1    Multiple Vitamin (MULTIVITAMIN) capsule, Take 1 capsule by mouth daily., Disp: 30 tablet, Rfl: 0    mycoPHENOLate mofetil (CELLCEPT) 500 MG tablet, TAKE 2 TABLETS BY MOUTH 2 TIMES DAILY, Disp: 360 tablet, Rfl: 3    Naltrexone HCl, Pain, 4.5 MG CAPS, Take 2 tablets by mouth daily., Disp: 60 capsule, Rfl: 0    valACYclovir (VALTREX) 500 MG tablet, TAKE 1 TABLET BY MOUTH EVERY DAY, Disp: 90 tablet, Rfl: 3    Allergies:  Allergies   Allergen Reactions    Ivig [Other] Other     Lymph nodes swollen        FAMILY HISTORY:  Family History   Problem Relation Name Age of Onset    Diabetes Mother          HTN, DJD    Neurological Disease (Parkinsons/Huntingtons/Huntingtons Chorea) Father          DM, alcoholism    Diabetes Father      No Known Problems Sister      Thyroid Brother      Cataract M Grandfather      Cataract P Grandfather         SOCIAL HISTORY:      reports that she has never smoked. She has never used smokeless tobacco. She reports that she does not currently use alcohol. She reports that she does not use drugs.    Review of Systems:  Review of Systems    Objective:     BP 129/64   Pulse 67   Temp 98.6 F (37 C) (Oral)   Resp 18   Ht 5' (1.524 m)   Wt 90.8 kg (200 lb 2 oz)   SpO2 100%   BMI 39.08 kg/m     Physical Exam:  Physical Exam  Vitals reviewed.   Constitutional:       Appearance: Normal appearance.   Eyes:      Extraocular Movements: Extraocular movements intact.      Conjunctiva/sclera: Conjunctivae normal.   Cardiovascular:      Rate and Rhythm: Normal rate and regular rhythm.   Pulmonary:      Effort: Pulmonary effort is normal.   Neurological:      Mental Status: She is alert.   Psychiatric:  Mood and Affect: Mood normal.         Behavior: Behavior normal.         Labs and Imaging:  Lab Results   Component Value Date    HGB 14.6 12/17/2022    SODIUM 140 04/23/2022     K 4.8 12/17/2022    CREAT 0.74 12/17/2022    GFR >60 04/23/2022    TSH 2.21 12/17/2022    AST 17 12/17/2022    ALT 15 12/17/2022    TBILI 0.5 12/17/2022    A1C 5.6 12/17/2022    CHOL 183 12/17/2022    LDL 111 (H) 12/17/2022    TRIG 166 (H) 12/17/2022    HEPATITISCAB NON-REACTIVE 12/17/2022         Assessment & Plan:     Anita Turner was seen today for follow up and back pain.    Diagnoses and all orders for this visit:    Health care maintenance  Reviewed all of labs completed for last annual physical exam.  Answered all of patient's questions.    Acute left-sided low back pain without sciatica  Acute, UA dip negative, likely strain or sprain.  Discussed heating pad, stretches and baclofen.   Follow up as needed.  -     Urine Dipstick (POC)  -     baclofen (LIORESAL) 10 MG tablet; Take 1 tablet (10 mg) by mouth 3 times daily.      Routine Health Maintenance:  The 10-year ASCVD risk score (Arnett DK, et al., 2019) is: 1.5%    Values used to calculate the score:      Age: 87 years      Sex: Female      Is Non-Hispanic African American: No      Diabetic: No      Tobacco smoker: No      Systolic Blood Pressure: 129 mmHg      Is BP treated: No      HDL Cholesterol: 44 mg/dL      Total Cholesterol: 183 mg/dL    Health Maintenance   Topic Date Due    Shingles Vaccine (1 of 2) 11/05/2023 (Originally 12/13/1991)    COVID-19 Vaccine (4 - 2023-24 season) 11/05/2023 (Originally 04/04/2022)    PHQ9 Depression Monitoring ([place])  03/14/2023    Breast Cancer Screen  07/08/2023    Annual AUDIT Screen  10/05/2023    Annual DAST Screen  10/05/2023    Cervical Cancer Screening  10/05/2026    Tetanus (2 - Td or Tdap) 03/30/2031    Colorectal Cancer Screening  01/09/2032    Hepatitis C Screening  Completed    Influenza  Completed    Universal HIV Screening  Completed    Pneumococcal Vaccine  Completed    HPV Vaccine <= 26 Yrs  Aged Out    Meningococcal MCV4 Vaccine  Aged Out       Requested Prescriptions      No prescriptions requested or ordered  in this encounter       Follow Up: No follow-ups on file.    Future Appointments   Date Time Provider Department Center   12/25/2022  2:20 PM [name], MD [facility] [place]   05/14/2023  1:00 PM Karma Greaser, MD [facility]

## 2022-12-25 NOTE — Telephone Encounter (Signed)
Please advise   Labd done on 12/17/22

## 2022-12-25 NOTE — Interdisciplinary (Signed)
Patient is here today for follow-up on labs.    C/o of upper back pain.

## 2022-12-25 NOTE — Telephone Encounter (Signed)
From: Rayann Heman  To: [name]  Sent: 12/25/2022 9:02 AM PDT  Subject: Hep A immunity     Hi Doctor:  The results show Abnormal but your message says I have Hep A immunity. I am a little confused. Am I at risk of Hep A?  Regards,  Anita Turner

## 2022-12-30 ENCOUNTER — Other Ambulatory Visit: Payer: Self-pay | Admitting: Nurse Practitioner

## 2022-12-30 ENCOUNTER — Other Ambulatory Visit: Payer: Self-pay | Admitting: Internal Medicine

## 2022-12-30 DIAGNOSIS — G4761 Periodic limb movement disorder: Secondary | ICD-10-CM

## 2022-12-31 ENCOUNTER — Other Ambulatory Visit: Payer: Self-pay | Admitting: Dermatology

## 2022-12-31 DIAGNOSIS — B009 Herpesviral infection, unspecified: Secondary | ICD-10-CM

## 2022-12-31 MED ORDER — GABAPENTIN 100 MG OR CAPS
ORAL_CAPSULE | ORAL | 1 refills | Status: DC
Start: 2022-12-31 — End: 2023-02-16

## 2023-01-01 ENCOUNTER — Encounter: Payer: Self-pay | Admitting: Dermatology

## 2023-01-01 NOTE — Telephone Encounter (Signed)
Anita Turner from [facility] [phone] would like to inform Anita Turner in office that [facility] will be filling   valACYclovir (VALTREX) 500 MG tablet   For patient no further action required

## 2023-01-12 MED ORDER — NALTREXONE HCL 50 MG OR TABS: 25.00 mg | ORAL_TABLET | Freq: Every day | ORAL | Status: AC

## 2023-01-12 NOTE — Telephone Encounter (Signed)
Patient confirmed she is ok with 50mg  tabs. Please send Rx. Kris Mouton, [place], MD   to [facility]       12/31/22 10:24 AM  Please call the patient to see if they would like [place] to see if there is another pharmacy that carries it. If not, please pend medication for 50 mg tablets, half tablet daily, 45 qty, no refills.    Herbert Moors, MD  [facility] Department of Family Medicine, [place]  12/31/2022

## 2023-01-13 MED ORDER — NALTREXONE HCL 50 MG OR TABS
25.0000 mg | ORAL_TABLET | Freq: Every day | ORAL | 0 refills | Status: DC
Start: 2023-01-13 — End: 2023-03-17

## 2023-01-13 NOTE — Telephone Encounter (Signed)
Kirtland Bouchard already ordered.

## 2023-01-22 ENCOUNTER — Encounter: Payer: Self-pay | Admitting: Dermatology

## 2023-01-22 MED ORDER — VALACYCLOVIR HCL 1000 MG OR TABS
1000.0000 mg | ORAL_TABLET | Freq: Two times a day (BID) | ORAL | 0 refills | Status: DC
Start: 2023-01-22 — End: 2023-07-22

## 2023-01-22 NOTE — Telephone Encounter (Deleted)
From: Rayann Heman  To: Karma Greaser  Sent: 01/22/2023 8:45 AM PDT  Subject: Urgent     Good morning doctor, I am having a blister for 2 days near my butthole I am assuming it might be Harpies breakout taking valtrex (500 mg usually I take only one) twice a day haven't seen any improvement I am concerned. We moved to [place] recently little stressed because of the move. Do you want me to take an appointment please let me know  Thank you   Padma

## 2023-02-04 ENCOUNTER — Other Ambulatory Visit: Payer: Self-pay | Admitting: Nurse Practitioner

## 2023-02-04 DIAGNOSIS — G4761 Periodic limb movement disorder: Secondary | ICD-10-CM

## 2023-02-06 NOTE — Telephone Encounter (Signed)
-----   Message from Bo Merino, NP sent at 02/06/2023  9:20 AM PDT -----  Regarding: Needs appointment  Patient would like a gabapentin refill.  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  She has no appointment pending

## 2023-02-16 ENCOUNTER — Ambulatory Visit (INDEPENDENT_AMBULATORY_CARE_PROVIDER_SITE_OTHER): Payer: No Typology Code available for payment source | Admitting: Nurse Practitioner

## 2023-02-16 VITALS — BP 129/76 | HR 89 | Ht 60.0 in | Wt 204.1 lb

## 2023-02-16 DIAGNOSIS — G4761 Periodic limb movement disorder: Secondary | ICD-10-CM

## 2023-02-16 MED ORDER — GABAPENTIN 300 MG OR CAPS
ORAL_CAPSULE | ORAL | 1 refills | Status: DC
Start: 2023-02-16 — End: 2023-06-15

## 2023-02-16 NOTE — Progress Notes (Signed)
I had the pleasure of seeing Anita Turner for follow up at the [facility].  She is a 52 year old female with PMH of HLD, fatty liver, anxiety, and depression who presents for follow-up of sleep study results    Interval History:  Since the last visit, Anita Turner Is overall sleeping better with the use of gabapentin.  She is now at 300 mg and has no side effects.  She will be going to visit family in [place] soon and reports she has no issues with sleep when she is there.  She more than likely will not take her gabapentin while she is there and would like to start off slow when she comes back.                10/02/22 PSG Results  1. SLEEP ARCHITECTURE: The total recording time of the diagnostic study was 420.1 minutes. The total sleep time was 340.0 minutes resulting in a sleep efficiency of 80.9% (normal ? 85%). The patient spent 23.0 minutes of total time in stage 1 sleep, 174.5 minutes in stage 2 sleep, 81.0 minutes in stage 3 sleep and 61.5 minutes in stage REM sleep. Latency to sleep onset was normal at 8.9 minutes with a normal latency to REM onset of 84.5 minutes.   2. AROUSALS: Wake after sleep onset (WASO) was 71.6 minutes with 94 arousals for an arousal index of 16.6 per hour.   3. EEG OBSERVATIONS: EEG did not show evidence of obvious epileptiform activity using a limited montage, and there were no clinical events concerning for seizure.   4. RESPIRATORY EVENTS: Anita Turner had a total of Obstructive Apnea Hypopnea Index (oAHI) of 2.8 and Central Apnea Hypopnea Index (cAHI) of 0, for an overall Apnea Hypopnea Index (AHI) of 2.8 per hour. There were a total of 10 RERA events for a total Respiratory Disturbance Index (RDI) of 4.6 per hour. The longest event was 70.7 seconds. Mild snoring was observed.   5. OXIMETRY: Baseline oxygen saturation during wake at the time of device set up was 100%. The lowest oxygen saturation was 91.0%. Total number of desaturations scored ?4%: 16.   6. CARDIAC:  ECG, with a limited montage, showed normal sinus rhythm. The overall average pulse rate was 84.3 beats per minute with an average pulse rate of 84.4 in NREM and 82.5 in REM. The maximum pulse rate was recorded at 119.0.   7. CARBON DIOXIDE: The transcutaneous carbon dioxide was within normal limits at 33-40 mmHg throughout the study.   8. MOVEMENT: There was 283 periodic limb movements (PLM) for a PLM index of 49.9 per hour (normal <15 per hour). There were 36 isolated limb movements with an index of 6.4 per hour. The total periodic limb movement arousal index was 8.6 per hour. No bruxism was observed throughout the study.         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    Past surgical history:  She  has a  past surgical history that includes Cholecystectomy (2017) and Appendectomy (2002).     Social history:  She  reports that she has never smoked. She has never used smokeless tobacco. She reports that she does not currently use alcohol. She reports that she does not use drugs.     Family history:  Her  family history includes Cataract in her m grandfather and p grandfather; Diabetes in her father and mother; Neurological Disease (Parkinsons/Huntingtons/Huntingtons Chorea) in her father; No Known Problems in her sister; Thyroid in her brother.     Medications:  She  has a  current medication list which includes the following prescription(s): alprazolam, baclofen, bupropion, gabapentin, multivitamin, mycophenolate mofetil, naltrexone, and valacyclovir.      Allergies:  She is allergic to ivig [other].     Review of Systems:  10/14 systems reviewed, all negative except where mentioned in HPI or below:  MSK: +knee pain     Physical Exam:  BP 129/76   Pulse 89   Ht 5' (1.524 m)   Wt 92.6 kg (204 lb 2.3 oz)   SpO2 100%   BMI 39.87 kg/m    General:  Well developed, well nourished female, who looks as stated age of 52 year old. No acute distress.   Neurological: Alert and Oriented x 3. Grossly normal, normal and symmetrical strength  Psychiatric: Normal mental status, mood is euthymic    LABORATORY DATA:  TSH:   TSH   Date Value Ref Range Status   12/17/2022 2.21 mIU/L Final     Comment:               Reference Range                         > or = 20 Years  0.40-4.50                              Pregnancy Ranges            First trimester    0.26-2.66            Second trimester   0.55-2.73            Third trimester    0.43-2.91        CBC:   WBC   Date Value Ref Range Status   12/17/2022 8.8 3.8 - 10.8 Thousand/uL Final     RBC   Date Value Ref Range Status   12/17/2022 4.79 3.80 - 5.10 Million/uL Final     HGB   Date Value Ref Range Status   12/17/2022 14.6 11.7 - 15.5 g/dL Final     HCT   Date Value Ref Range Status   12/17/2022 41.9 35.0 - 45.0 % Final     MCV   Date Value Ref Range Status   12/17/2022 87.5 80.0 - 100.0 fL Final     MCH   Date Value Ref Range Status   12/17/2022 30.5 27.0 - 33.0 pg Final     MCHC   Date Value Ref Range Status   12/17/2022 34.8 32.0 - 36.0 g/dL Final     RDW   Date Value Ref Range Status   12/17/2022 12.8 11.0 - 15.0 % Final     PLT   Date Value Ref Range Status   12/17/2022 393 140 - 400 Thousand/uL Final     MPV   Date Value Ref Range  Status   12/17/2022 9.9 7.5 - 12.5 fL Final      Glucose:   Glucose   Date Value Ref Range Status   12/17/2022 86  65 - 99 mg/dL Final     Comment:                   Fasting reference interval          Ferritin:   Ferritin   Date Value Ref Range Status   05/03/2021 24 10 - 107 NG/ML Final      Iron Saturation: No results found for: "PSAT"   B12:   Vitamin B12   Date Value Ref Range Status   12/17/2022 336 200 - 1,100 pg/mL Final     Comment:        Please Note: Although the reference range for vitamin  B12 is [phone] pg/mL, it has been reported that between  5 and 10% of patients with values between 200 and 400  pg/mL may experience neuropsychiatric and hematologic  abnormalities due to occult B12 deficiency; less than 1%  of patients with values above 400 pg/mL will have symptoms.               IMPRESSION:   Anita Turner is a 52 year old female with PMH of HLD, fatty liver, anxiety, and depression who presents today for follow up   For her PLM and on gabapentin.    #. Periodic Limb Movements of Sleep: PLMI 49.9/hr and PLM arousal index 8.6/hr; possible contributing causes include insufficient iron levels and/or neuropathy.     #. Snoring: mild     PLAN:  #.   Iron studies reordered she did not get them prior to coming in today  #.  Continue at Gabapentin  300 mg.  Refills ordered  #.   Increase exercise  #.   Discussed positional therapy when she sleeps  #. Do not make important decisions when sleepy or tired this increase the risk for mistakes.   #. Healthy weight loss is encouraged as it may help reduce the severity of underlying sleep breathing disorder.  #. Follow up  in 6 months zoom is okay. Will determine appropriate interval of follow up after initial trial of gabapentin. Please contact clinic if you have any questions or concerns that arise before your next appointment.      Kirtland Bouchard NP-C      I have personally provided 30 minutes of clinical care time. Time includes review of results, clarification of symptoms, treatment recommendations, communication to arrange next steps and documentation of the above.

## 2023-02-23 ENCOUNTER — Encounter: Payer: Self-pay | Admitting: Dermatology

## 2023-02-25 ENCOUNTER — Encounter: Payer: Self-pay | Admitting: Dermatology

## 2023-02-27 ENCOUNTER — Encounter: Payer: Self-pay | Admitting: Nurse Practitioner

## 2023-02-27 LAB — IRON, TIBC AND FERRITIN PANEL - QUEST
Ferritin: 23 ng/mL (ref 16–232)
IIBC: 316 mcg/dL (calc) (ref 250–450)
Iron Saturation: 19 % (calc) (ref 16–45)
Iron: 60 ug/dL (ref 45–160)

## 2023-02-27 NOTE — Progress Notes (Signed)
Iron panel ordered goal is for ferritin to be 75 or greater

## 2023-03-09 ENCOUNTER — Other Ambulatory Visit: Payer: Self-pay | Admitting: Family Practice

## 2023-03-09 DIAGNOSIS — F32A Depression, unspecified: Secondary | ICD-10-CM

## 2023-03-09 DIAGNOSIS — G4700 Insomnia, unspecified: Secondary | ICD-10-CM

## 2023-03-13 ENCOUNTER — Encounter: Payer: Self-pay | Admitting: Internal Medicine

## 2023-03-13 NOTE — Telephone Encounter (Signed)
[name], MD   to [facility]         03/13/23  8:36 AM  Declined refill of xanax for sleep, patient working with sleep management and would need to follow up with them for further refills.    Herbert Moors, MD  [facility] Department of Family Medicine, [place]  03/13/2023

## 2023-03-16 ENCOUNTER — Encounter: Payer: Self-pay | Admitting: Internal Medicine

## 2023-03-16 ENCOUNTER — Encounter: Payer: Self-pay | Admitting: Family Practice

## 2023-03-16 DIAGNOSIS — G4700 Insomnia, unspecified: Secondary | ICD-10-CM

## 2023-03-16 DIAGNOSIS — F32A Depression, unspecified: Secondary | ICD-10-CM

## 2023-03-16 NOTE — Telephone Encounter (Deleted)
From: Rayann Heman  To: [name]  Sent: 03/16/2023 9:26 AM PDT  Subject: Urgent     Good morning Doctor, I am leaving to [place] for a month tonight, please can you refill my prescription for Alprazolam. I am not using this for sleep I use this when needed for stress. If you check my history I think my last refill was 2023 December. Also my pharmacy is changed to [facility] I will here from you soon  Thank you   Anita Turner

## 2023-03-16 NOTE — Telephone Encounter (Signed)
From: Rayann Heman  To: [name]  Sent: 03/13/2023 1:47 PM PDT  Subject: Prescription refill request     Good afternoon Dr. Fransisco Beau, I requested a refill for a prescription Xanax 0.5 mg tablets few days back, I am not using this for sleep I am using them as needed which might be once 2 weeks or so. I am leaving to [place] for one month as I am out of this medication checking if I can get a refill please do let me know, I am leaving to [place] on Monday 12 th August.   Thank you   Anita Turner

## 2023-03-17 ENCOUNTER — Other Ambulatory Visit: Payer: Self-pay | Admitting: Internal Medicine

## 2023-03-17 MED ORDER — ALPRAZOLAM 0.5 MG OR TABS
0.5000 mg | ORAL_TABLET | Freq: Every evening | ORAL | 2 refills | Status: DC | PRN
Start: 2023-03-17 — End: 2023-11-12

## 2023-03-17 MED ORDER — NALTREXONE HCL 50 MG OR TABS
25.0000 mg | ORAL_TABLET | Freq: Every day | ORAL | 0 refills | Status: DC
Start: 2023-03-17 — End: 2023-11-12

## 2023-03-17 NOTE — Telephone Encounter (Signed)
Pts pharmacy is requesting medication refill  Pt was last seen 12/25/22  Medication last filled 01/13/23

## 2023-03-17 NOTE — Telephone Encounter (Signed)
Refill request for Xanax.  Pharmacy updated.

## 2023-03-17 NOTE — Telephone Encounter (Signed)
Rx was refilled by Dr. Berniece Andreas. No further action required at this time.

## 2023-03-17 NOTE — Telephone Encounter (Signed)
From: Rayann Heman  To: Ricardo Jericho  Sent: 03/16/2023 9:37 AM PDT  Subject: Urgent     Good morning Doctor, due to family emergency i am leaving to [place] tonight for a month I saw Doctor Nikki Dom few times I requested for a refill for my prescription for Alprazolam few days back she did not refill as she thought I am using for sleep today she [place] not in office, if you check my records I use them for stress and my last refill was in December 2023 please doctor can you refill my prescription also my pharmacy is changed to [facility] [place] [id number]   Pharmacy phone number: [phone]  Thank you  Anita Turner

## 2023-04-20 ENCOUNTER — Encounter: Payer: Self-pay | Admitting: Family Practice

## 2023-05-05 ENCOUNTER — Encounter: Payer: Self-pay | Admitting: Dermatology

## 2023-05-06 NOTE — Telephone Encounter (Deleted)
From: Rayann Heman  To: Karma Greaser  Sent: 05/05/2023 9:23 AM PDT  Subject: Very Urgent     Good morning Doctor, I noticed a blister on my scalp this morning, I have an appointment coming up on 10th do you want me to wait till I see you or do you think I can get an earlier appointment? Please let me know   Thank you   Padma

## 2023-05-07 ENCOUNTER — Encounter: Payer: Self-pay | Admitting: Dermatology

## 2023-05-07 NOTE — Telephone Encounter (Signed)
Can you send a Rx for prednisone to the [facility]) on file.

## 2023-05-08 MED ORDER — PREDNISONE 10 MG OR TABS
30.0000 mg | ORAL_TABLET | Freq: Every morning | ORAL | 0 refills | Status: DC
Start: 2023-05-08 — End: 2023-11-12

## 2023-05-13 NOTE — Progress Notes (Signed)
Department of Dermatology  [facility]  PATIENT: Anita Turner  MRN: [id number]  DOB: 12/21/1970  DATE OF SERVICE: 05/14/2023    DERMATOLOGY FOLLOW-UP PATIENT VISIT     REFERRING PRACTITIONER: Karma Greaser  PRIMARY CARE PROVIDER: Herbert Moors A  CHIEF COMPLAINT:   Chief Complaint   Patient presents with    Follow Up     Subjective:     Anita Turner is a 52 year old female who was seen in clinic for f/u pemphigus vulgaris and pemphigus foliaceous. She recently noted a blister on her scalp in the setting of stress while her daughter was moving to college. She did not take prednisone at that time as blister resolved quickly within less than a week. It was not painful or itchy. She continues with Cellcept 1000 mg daily with no issues. Needs labs done.    Has numerous skin tags on her neck, axillae, and inframammary folds that are bothersome and she would like removed.     Objective:   General: Pleasant, in no acute distress, appears stated age  Respiratory: No increased work of breathing    There were no vitals filed for this visit.    Skin Exam:  Pertinent skin findings below:    No erosions or ulcerations on skin or inside mouth  Numerous pedunculated papules on axillae as well as inframammary folds and neck  Vertex scalp with faint scale, no active erythema or erosion or ulceration    02/07/2019   Basement Membrane Zone (BMZ) IgG, IgG4, and IgA  IgG:  Negative, monkey esophagus substrate        Negative, human split skin substrate      IgG4:  Negative, monkey esophagus substrate             Negative, human split skin substrate   IgA:  Negative, monkey esophagus substrate        Negative, human split skin substrate   Positive IgG, including IgG4, cell surface antibodies,                monkey esophagus substrate      Enzyme Linked Immunosorbent Assay (ELISA)   --------------------------------------------------   Bullous Pemphigoid (BP) 180 and 230 IgG Antibodies   IgG BP 180 antibodies:  1 unit   NEGATIVE  IgG BP 230 antibodies:  1 unit  NEGATIVE     02/07/2019  Cell Surface IgG Antibodies   IgG:  Positive, titer 1:2560 (H), monkey esophagus substrate        Positive, titer 1:640 (H), intact human skin substrate      Enzyme-Linked Immunosorbent Assay (ELISA)  -----------------------------------------  Desmoglein (DSG) 1 and 3 IgG Antibodies   IgG desmoglein 1 antibodies:   7 units  NEGATIVE       Reference Range:          Positive (H) = Greater than 20 units          Borderline/Indeterminate = 14-20 units          Negative = Less than 14 units   IgG desmoglein 3 antibodies: 320 units (H)  POSITIVE         (Initial level, 139 units, greater than high                calibrator; diluted to achieve 64 units, within          assay calibrators, and multiplied by the dilution          factor of 5)  COMMENTS   Specific   -------------------------------------------------   These indirect immunofluorescence results, demonstrating   positive IgG cell surface antibody reactivity on both   monkey esophagus and intact human skin substrates, support   the diagnosis of pemphigus.    Assessment/Plan     # Pemphigus vulgaris  # High risk medication use  Xerostomia resolved with resumption of cellcept.  Currently well-controlled with no flares  Not on prednisone   Patient previously followed by Dr. Roetta Sessions and prior [facility] including serologies for pemphigus/pemphigoid, CBC, CMP. She was previously on rituxan. 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      - Have labs drawn: CBC and CMP (quest), and every 3-4 months  - Continue cellcept 1000 mg daily   - As well-controlled at this time, will hold off on adding additional therapies. Instructed to take photos if lesions recur and send via MyChart  - Repeat Desmoglein Antibodies ordered today   - Continue B12 replacement   - Resume NCN 500 mg can take bid if unable to  take tid (refilled)  - Follow-up with Dr. Berniece Andreas and Naturopath    # Acrochordons, irritated  Some were treated with LN2 and some with snip therapy  - Clinically without concerning features on exam today but the patient is to return to clinic for any changing skin lesions or skin concerns  - As some irritated, reasonable to treat in office today with LN2 (NO CHARGE)  - Cryotherapy procedure note. Verbal consent obtained. Risks of infection, scarring, nerve damage, pigment change, numbness, incomplete removal, recurrence, bleeding, pain, and allergy anesthesia were all reviewed. Benefits and alternatives also discussed. The patient agreed to proceed.    The above plan of care, diagnosis, orders, and follow-up were discussed with the patient. Risks, benefits, alternatives to therapy were discussed. Questions related to this recommended plan of care were answered.    RTC 3-4 months or sooner prn    Resident: Joneen Caraway, MD    Scribe Attestation  The notes I am recording reflect only actions made by and judgments taken by this provider, Dr. Dorma Russell, for whom I am scribing today.  I have performed no independent clinical work.    Darrin Nipper    Provider Attestation for Scribed Note    As the attending provider, I agree with the scribed content.  Any changes or edits are noted in the text above.     I attest that I saw and evaluated the patient and discussed the assessment and plan with the resident and I agree with the assessment and plan as documented in the note. I fully participated in the management of the patient.     Karma Greaser

## 2023-05-14 ENCOUNTER — Ambulatory Visit (INDEPENDENT_AMBULATORY_CARE_PROVIDER_SITE_OTHER): Payer: No Typology Code available for payment source | Admitting: Dermatology

## 2023-05-14 DIAGNOSIS — L918 Other hypertrophic disorders of the skin: Secondary | ICD-10-CM

## 2023-05-14 DIAGNOSIS — Z79899 Other long term (current) drug therapy: Secondary | ICD-10-CM

## 2023-05-14 DIAGNOSIS — L100 Pemphigus vulgaris: Secondary | ICD-10-CM

## 2023-05-14 MED ORDER — NIACINAMIDE 500 MG OR TABS
500.0000 mg | ORAL_TABLET | Freq: Three times a day (TID) | ORAL | 11 refills | Status: AC
Start: 2023-05-14 — End: ?

## 2023-05-27 LAB — CBC WITH DIFF, BLOOD
Abs Basophils: 102 {cells}/uL (ref 0–200)
Abs Eosinophils: 265 {cells}/uL (ref 15–500)
Abs Lymphs: 3305 {cells}/uL (ref 850–3900)
Abs Monocytes: 867 {cells}/uL (ref 200–950)
Abs Neutrophils: 5661 {cells}/uL (ref 1500–7800)
Basophils: 1 %
Eosinophils: 2.6 %
HCT: 42.5 % (ref 35.0–45.0)
HGB: 13.8 g/dL (ref 11.7–15.5)
Lymps: 32.4 %
MCH: 28.5 pg (ref 27.0–33.0)
MCHC: 32.5 g/dL (ref 32.0–36.0)
MCV: 87.8 fL (ref 80.0–100.0)
MPV: 10.1 fL (ref 7.5–12.5)
Monocytes: 8.5 %
PLT: 416 10*3/uL — ABNORMAL HIGH (ref 140–400)
RBC: 4.84 10*6/uL (ref 3.80–5.10)
RDW: 13.2 % (ref 11.0–15.0)
SEGS: 55.5 %
WBC: 10.2 10*3/uL (ref 3.8–10.8)

## 2023-05-27 LAB — COMPREHENSIVE METABOLIC PANEL, BLOOD
ALT (SGPT): 13 U/L (ref 6–29)
AST (SGOT): 16 U/L (ref 10–35)
Albumin/Glob Ratio: 1.8 (calc) (ref 1.0–2.5)
Albumin: 4.2 g/dL (ref 3.6–5.1)
Alkaline Phos: 90 U/L (ref 37–153)
BUN: 8 mg/dL (ref 7–25)
Bilirubin, Total: 0.3 mg/dL (ref 0.2–1.2)
Calcium: 9.1 mg/dL (ref 8.6–10.4)
Carbon Dioxide: 25 mmol/L (ref 20–32)
Chloride: 104 mmol/L (ref 98–110)
Creatinine: 0.77 mg/dL (ref 0.50–1.03)
EGFR: 94 mL/min/{1.73_m2} (ref >=60)
Globulin: 2.4 g/dL (ref 1.9–3.7)
Glucose: 83 mg/dL (ref 65–99)
Potassium: 4.5 mmol/L (ref 3.5–5.3)
Sodium: 137 mmol/L (ref 135–146)
Total Protein: 6.6 g/dL (ref 6.1–8.1)

## 2023-05-27 LAB — DESMOGLEIN ANTIBODIES (1 AND 3) - QUEST HISTORICAL

## 2023-05-27 NOTE — Addendum Note (Signed)
Addended by: Loraine Leriche on: 05/27/2023 04:59 PM     Modules accepted: Orders

## 2023-06-06 LAB — COMPREHENSIVE METABOLIC PANEL, BLOOD - ~~LOC~~
ALT (SGPT): 13 U/L (ref 6–29)
AST (SGOT): 16 U/L (ref 10–35)
Albumin/Glob Ratio: 1.8 (calc) (ref 1.0–2.5)
Albumin: 4.2 g/dL (ref 3.6–5.1)
Alkaline Phos: 90 U/L (ref 37–153)
BUN: 8 mg/dL (ref 7–25)
Bilirubin, Total: 0.3 mg/dL (ref 0.2–1.2)
Calcium: 9.1 mg/dL (ref 8.6–10.4)
Carbon Dioxide: 25 mmol/L (ref 20–32)
Chloride: 104 mmol/L (ref 98–110)
Creatinine: 0.77 mg/dL (ref 0.50–1.03)
EGFR: 94 mL/min/{1.73_m2} (ref >=60)
Globulin: 2.4 g/dL (ref 1.9–3.7)
Glucose: 83 mg/dL (ref 65–99)
Potassium: 4.5 mmol/L (ref 3.5–5.3)
Sodium: 137 mmol/L (ref 135–146)
Total Protein: 6.6 g/dL (ref 6.1–8.1)

## 2023-06-06 LAB — CBC W/ DIFF, BLOOD - ~~LOC~~
Abs Basophils: 102 {cells}/uL (ref 0–200)
Abs Eosinophils: 265 {cells}/uL (ref 15–500)
Abs Lymphs: 3305 {cells}/uL (ref 850–3900)
Abs Monocytes: 867 {cells}/uL (ref 200–950)
Abs Neutrophils: 5661 {cells}/uL (ref 1500–7800)
Basophils: 1 %
Eosinophils: 2.6 %
HCT: 42.5 % (ref 35.0–45.0)
HGB: 13.8 g/dL (ref 11.7–15.5)
Lymps: 32.4 %
MCH: 28.5 pg (ref 27.0–33.0)
MCHC: 32.5 g/dL (ref 32.0–36.0)
MCV: 87.8 fL (ref 80.0–100.0)
MPV: 10.1 fL (ref 7.5–12.5)
Monocytes: 8.5 %
PLT: 416 10*3/uL — ABNORMAL HIGH (ref 140–400)
RBC: 4.84 10*6/uL (ref 3.80–5.10)
RDW: 13.2 % (ref 11.0–15.0)
SEGS: 55.5 %
WBC: 10.2 10*3/uL (ref 3.8–10.8)

## 2023-06-06 LAB — PEMPHIGUS ANTIBODY PANEL, IGG - ~~LOC~~
Desmoglein 1 Antibody: <14 U/mL
Desmoglein 3 Antibody: <9 U/mL

## 2023-06-06 LAB — BULLOUS PEMPHIGOID BP230 IGG - QUEST HISTORICAL: Bullous Pemphigoid Bp230 Igg: <9 U/mL (ref <9)

## 2023-06-06 LAB — BULLOUS PEMPHIGOID AG (BP 180) AB - QUEST HISTORICAL: Bullous Pemphigoid Ag (Bp 180) Ab: <5 U/mL

## 2023-06-10 ENCOUNTER — Other Ambulatory Visit: Payer: Self-pay | Admitting: Nurse Practitioner

## 2023-06-10 DIAGNOSIS — G4761 Periodic limb movement disorder: Secondary | ICD-10-CM

## 2023-06-15 MED ORDER — GABAPENTIN 300 MG OR CAPS
ORAL_CAPSULE | ORAL | 1 refills | Status: AC
Start: 2023-06-15 — End: ?

## 2023-07-06 ENCOUNTER — Other Ambulatory Visit: Payer: Self-pay | Admitting: Dermatology

## 2023-07-06 DIAGNOSIS — Z79899 Other long term (current) drug therapy: Secondary | ICD-10-CM

## 2023-07-20 ENCOUNTER — Other Ambulatory Visit: Payer: Self-pay | Admitting: Dermatology

## 2023-07-22 ENCOUNTER — Other Ambulatory Visit: Payer: Self-pay | Admitting: Family Practice

## 2023-07-22 MED ORDER — VALACYCLOVIR HCL 1000 MG OR TABS
1000.0000 mg | ORAL_TABLET | Freq: Two times a day (BID) | ORAL | 0 refills | Status: DC
Start: 2023-07-22 — End: 2023-07-30

## 2023-07-23 ENCOUNTER — Other Ambulatory Visit: Payer: Self-pay | Admitting: Family Practice

## 2023-07-23 ENCOUNTER — Encounter: Payer: Self-pay | Admitting: Family Practice

## 2023-07-27 ENCOUNTER — Other Ambulatory Visit: Payer: Self-pay

## 2023-07-30 ENCOUNTER — Encounter: Payer: Self-pay | Admitting: Dermatology

## 2023-07-30 ENCOUNTER — Other Ambulatory Visit: Payer: Self-pay

## 2023-07-30 MED ORDER — VALACYCLOVIR HCL 1000 MG OR TABS
1000.0000 mg | ORAL_TABLET | Freq: Two times a day (BID) | ORAL | 0 refills | Status: DC
Start: 2023-07-30 — End: 2024-04-26

## 2023-08-06 ENCOUNTER — Encounter: Payer: Self-pay | Admitting: Family Practice

## 2023-08-06 MED ORDER — VALACYCLOVIR HCL 500 MG OR TABS
500.0000 mg | ORAL_TABLET | Freq: Every day | ORAL | 3 refills | Status: AC
Start: 2023-08-06 — End: ?

## 2023-08-06 NOTE — Telephone Encounter (Signed)
Refilled valtrex  

## 2023-08-07 MED ORDER — BUPROPION XL (DAILY) 150 MG OR TB24
150.0000 mg | ORAL_TABLET | Freq: Every morning | ORAL | 0 refills | Status: DC
Start: 2023-08-07 — End: 2023-11-12

## 2023-08-18 ENCOUNTER — Telehealth: Payer: No Typology Code available for payment source | Admitting: Nurse Practitioner

## 2023-08-18 ENCOUNTER — Ambulatory Visit: Payer: No Typology Code available for payment source | Admitting: Nurse Practitioner

## 2023-08-18 DIAGNOSIS — G4733 Obstructive sleep apnea (adult) (pediatric): Secondary | ICD-10-CM

## 2023-08-18 DIAGNOSIS — G2581 Restless legs syndrome: Secondary | ICD-10-CM

## 2023-08-18 NOTE — Progress Notes (Signed)
Zoom Meeting :    This service is being conducted via video.   The patient confirmed to be in the [place] presently.    The patient gave consent to proceed with the video/telephone visit today      I had the pleasure of seeing Anita Turner for follow up at the [facility].  She is a 53 year old female with PMH of HLD, fatty liver, anxiety, and depression who presents for follow-up for management of PLM    Interval History:  Since the last visit, Anita Turner has stopped the melatonin and would like to wean off the gabapentin.  She is not sure if long-term medication is good for her.  She has now been taking over-the-counter iron tablets every other day for a proximally 4 months.  She reports she is sleeping well and feels refreshed when she wakes up.      Epworth Sleep Scale Score: 1    FOSQ Score: 20    10/02/22 PSG Results  1. SLEEP ARCHITECTURE: The total recording time of the diagnostic study was 420.1 minutes. The total sleep time was 340.0 minutes resulting in a sleep efficiency of 80.9% (normal >= 85%). The patient spent 23.0 minutes of total time in stage 1 sleep, 174.5 minutes in stage 2 sleep, 81.0 minutes in stage 3 sleep and 61.5 minutes in stage REM sleep. Latency to sleep onset was normal at 8.9 minutes with a normal latency to REM onset of 84.5 minutes.   2. AROUSALS: Wake after sleep onset (WASO) was 71.6 minutes with 94 arousals for an arousal index of 16.6 per hour.   3. EEG OBSERVATIONS: EEG did not show evidence of obvious epileptiform activity using a limited montage, and there were no clinical events concerning for seizure.   4. RESPIRATORY EVENTS: [name] had a total of Obstructive Apnea Hypopnea Index (oAHI) of 2.8 and Central Apnea Hypopnea Index (cAHI) of 0, for an overall Apnea Hypopnea Index (AHI) of 2.8 per hour. There were a total of 10 RERA events for a total Respiratory Disturbance Index (RDI) of 4.6 per hour. The longest event was 70.7 seconds. Mild  snoring was observed.   5. OXIMETRY: Baseline oxygen saturation during wake at the time of device set up was 100%. The lowest oxygen saturation was 91.0%. Total number of desaturations scored >=4%: 16.   6. CARDIAC: ECG, with a limited montage, showed normal sinus rhythm. The overall average pulse rate was 84.3 beats per minute with an average pulse rate of 84.4 in NREM and 82.5 in REM. The maximum pulse rate was recorded at 119.0.   7. CARBON DIOXIDE: The transcutaneous carbon dioxide was within normal limits at 33-40 mmHg throughout the study.   8. MOVEMENT: There was 283 periodic limb movements (PLM) for a PLM index of 49.9 per hour (normal <15 per hour). There were 36 isolated limb movements with an index of 6.4 per hour. The total periodic limb movement arousal index was 8.6 per hour. No bruxism was observed throughout the study.         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    Past surgical history:  She  has a past surgical history that includes Cholecystectomy (2017) and Appendectomy (2002).     Social history:  She  reports that she has never smoked. She has never used smokeless tobacco. She reports that she does not currently use alcohol. She reports that she does not use drugs.     Family history:  Her  family history includes Cataract in her m grandfather and p  grandfather; Diabetes in her father and mother; Neurological Disease (Parkinsons/Huntingtons/Huntingtons Chorea) in her father; No Known Problems in her sister; Thyroid in her brother.     Medications:  She  has a current medication list which includes the following prescription(s): alprazolam, baclofen, bupropion, gabapentin, multivitamin, mycophenolate mofetil, naltrexone, niacinamide, prednisone, valacyclovir, and valacyclovir.      Allergies:  She is allergic to ivig [other].     Review of Systems:  Comprehensive 12-point review of systems is negative, except as noted in HPI    MSK: +knee pain     Physical Exam:  There were no vitals taken for this visit.   General:  Well developed, well nourished female, who looks as stated age of 53 year old. No acute distress.   Neurological: Alert and Oriented x 3. Grossly normal, normal and symmetrical strength  Psychiatric: Normal mental status, mood is euthymic    LABORATORY DATA:  TSH:   TSH   Date Value Ref Range Status   12/17/2022 2.21 mIU/L Final     Comment:               Reference Range                         > or = 20 Years  0.40-4.50                              Pregnancy Ranges            First trimester    0.26-2.66            Second trimester   0.55-2.73            Third trimester    0.43-2.91        CBC:   WBC   Date Value Ref Range Status   05/22/2023 10.2 3.8 - 10.8 Thousand/uL Final   05/22/2023 10.2 3.8 - 10.8 Thousand/uL Final     RBC   Date Value Ref Range Status   05/22/2023 4.84 3.80 - 5.10 Million/uL Final   05/22/2023 4.84 3.80 - 5.10 Million/uL Final     HGB   Date Value Ref Range Status   05/22/2023 13.8 11.7 - 15.5 g/dL Final   16/05/9603 54.0 11.7 - 15.5 g/dL Final     HCT   Date Value Ref Range Status   05/22/2023 42.5 35.0 - 45.0 % Final   05/22/2023 42.5 35.0 - 45.0 % Final     MCV   Date Value Ref Range Status   05/22/2023 87.8 80.0 - 100.0 fL Final   05/22/2023 87.8 80.0 - 100.0 fL Final     MCH   Date Value Ref Range Status   05/22/2023  28.5 27.0 - 33.0 pg Final   05/22/2023 28.5 27.0 - 33.0 pg Final  MCHC   Date Value Ref Range Status   05/22/2023 32.5 32.0 - 36.0 g/dL Final     Comment:     For adults, a slight decrease in the calculated MCHC  value (in the range of 30 to 32 g/dL) is most likely  not clinically significant; however, it should be  interpreted with caution in correlation with other  red cell parameters and the patient's clinical  condition.     05/22/2023 32.5 32.0 - 36.0 g/dL Final     Comment:     For adults, a slight decrease in the calculated MCHC  value (in the range of 30 to 32 g/dL) is most likely  not clinically significant; however, it should be  interpreted with caution in correlation with other  red cell parameters and the patient's clinical  condition.       RDW   Date Value Ref Range Status   05/22/2023 13.2 11.0 - 15.0 % Final   05/22/2023 13.2 11.0 - 15.0 % Final     PLT   Date Value Ref Range Status   05/22/2023 416 140 - 400 Thousand/uL Final   05/22/2023 416 140 - 400 Thousand/uL Final     MPV   Date Value Ref Range Status   05/22/2023 10.1 7.5 - 12.5 fL Final   05/22/2023 10.1 7.5 - 12.5 fL Final      Glucose:   Glucose   Date Value Ref Range Status   05/22/2023 83 65 - 99 mg/dL Final     Comment:                   Fasting reference interval        05/22/2023 83 65 - 99 mg/dL Final     Comment:                   Fasting reference interval          Ferritin:   Ferritin   Date Value Ref Range Status   02/26/2023 23 16 - 232 ng/mL Final      Iron Saturation: No results found for: "PSAT"   B12:   Vitamin B12   Date Value Ref Range Status   12/17/2022 336 200 - 1,100 pg/mL Final     Comment:        Please Note: Although the reference range for vitamin  B12 is [phone] pg/mL, it has been reported that between  5 and 10% of patients with values between 200 and 400  pg/mL may experience neuropsychiatric and hematologic  abnormalities due to occult B12 deficiency; less than 1%  of patients with values above 400 pg/mL  will have symptoms.               IMPRESSION:   Dietrich Skeeters is a 53 year old female with PMH of HLD, fatty liver, anxiety, and depression who presents today for follow up amount of PLM.  She is currently wean herself off the melatonin and a would like to wean herself off the gabapentin.  Due to the significance of her PLM she has been on over-the-counter iron tablets for a proximally 4 months.  We will retest and see if her ferritin and iron saturation levels have been elevated.  She will wean off gabapentin and next few weeks and then follow up in 6 months.    #. Periodic Limb Movements of Sleep: PLMI 49.9/hr and PLM arousal index 8.6/hr; possible contributing causes include insufficient iron levels and/or  neuropathy.     #. Snoring: mild     PLAN:  #.   Iron panel order  #.   Wean off gabapentin as tolerated  #.   Continue to exercise this will help consolidate your sleep  #.   The recommendation is to use positional therapy  #.   Do not partake in risky behavior when sleepy or tired this increase the risk for accidents and mistakes  #.   Follow up  in 6 months zoom is okay.  Please contact clinic if you have any questions or concerns that arise before your next appointment.      Kirtland Bouchard NP-C      I have personally provided 30 minutes of clinical care time. Time includes review of results, clarification of symptoms, treatment recommendations, communication to arrange next steps and documentation of the above.

## 2023-08-18 NOTE — Telephone Encounter (Signed)
I called Anita Turner, mailbox is full unable to leave a message

## 2023-08-18 NOTE — Patient Instructions (Addendum)
Follow up in 6 months if needed    Please go get your iron panel labs    Wean off gabapentin as tolerated

## 2023-08-21 ENCOUNTER — Ambulatory Visit: Payer: Self-pay | Admitting: Internal Medicine

## 2023-08-31 ENCOUNTER — Other Ambulatory Visit: Payer: Self-pay | Admitting: Dermatology

## 2023-08-31 DIAGNOSIS — Z79899 Other long term (current) drug therapy: Secondary | ICD-10-CM

## 2023-09-09 ENCOUNTER — Other Ambulatory Visit: Payer: Self-pay | Admitting: Dermatology

## 2023-09-09 DIAGNOSIS — L100 Pemphigus vulgaris: Secondary | ICD-10-CM

## 2023-09-11 ENCOUNTER — Other Ambulatory Visit: Payer: Self-pay | Admitting: Dermatology

## 2023-09-11 ENCOUNTER — Other Ambulatory Visit: Payer: Self-pay | Admitting: Nurse Practitioner

## 2023-09-11 DIAGNOSIS — L100 Pemphigus vulgaris: Secondary | ICD-10-CM

## 2023-09-11 DIAGNOSIS — G4761 Periodic limb movement disorder: Secondary | ICD-10-CM

## 2023-09-11 MED ORDER — MYCOPHENOLATE MOFETIL 500 MG OR TABS
1000.0000 mg | ORAL_TABLET | Freq: Two times a day (BID) | ORAL | 3 refills | Status: AC
Start: 2023-09-11 — End: ?

## 2023-09-13 MED ORDER — GABAPENTIN 100 MG OR CAPS
ORAL_CAPSULE | ORAL | 1 refills | Status: DC
Start: 2023-09-13 — End: 2023-11-12

## 2023-09-13 NOTE — Telephone Encounter (Signed)
Prescription for lower dose given as patient is weaning off gabapentin.

## 2023-10-02 ENCOUNTER — Ambulatory Visit: Payer: Commercial Managed Care - PPO | Admitting: Dermatology

## 2023-10-02 ENCOUNTER — Ambulatory Visit: Payer: Self-pay | Admitting: Dermatology

## 2023-10-16 ENCOUNTER — Telehealth: Payer: Self-pay | Admitting: Dermatology

## 2023-10-16 NOTE — Telephone Encounter (Signed)
 Patient requesting sooner appointment.     Reason for sooner appointment:  Patient has a medical concern (not triage related) and needs a sooner appointment    Tentatively scheduled with Provider: Arlean Promise at Date/Time: 02/09/2024 @ 1:30 pm  Location: [facility] .    Please assist.

## 2023-10-16 NOTE — Telephone Encounter (Signed)
 We do not have sooner than next available for facial redness

## 2023-10-26 ENCOUNTER — Other Ambulatory Visit: Payer: Self-pay | Admitting: Dermatology

## 2023-10-26 DIAGNOSIS — Z79899 Other long term (current) drug therapy: Secondary | ICD-10-CM

## 2023-10-30 ENCOUNTER — Ambulatory Visit: Admitting: Internal Medicine

## 2023-10-30 NOTE — Progress Notes (Deleted)
 Patient: Anita Turner  DOB: [birth year]  Primary Care Provider: Ruel Cotta A  BRIEF VISIT/FOLLOW UP-ADULT    Subjective:      Anita Turner is a 53 year old Not Hispanic, Latino(a), or Spanish origin female who presents today for No chief complaint on file.      Current Concerns/Interval History:     #***      HCM:      03/13/2021     7:48 AM 11/21/2021    11:06 AM 03/27/2022    11:10 AM 07/23/2022     2:23 PM 09/11/2022     8:39 AM 12/12/2022    10:42 AM 08/18/2023     8:25 AM   [facility] PHQ9 DEPRESSION QUESTIONNAIRE   Interest 0 0 2 0 3 0 0   Depressed 0 0 0 1 0 1 1   Sleep 0 0 3 2 2 1 1    Energy 0 0 2 1 2 1 1    Appetite 0 0 3 1 0 1 0   Failure 0 0 1 0 0 0 0   Concentration 0 0 2 0 0 0 1   Movement 0 0 0 0 0 0 0   Suicide 0 0 0 -- 0 0 0   Summary(Calculated) 0 0 13 -- 7 4 4    Functional Not difficult at all Not difficult at all Somewhat difficult Not difficult at all Not difficult at all Not difficult at all Somewhat difficult         Past Medical History:  Patient Active Problem List   Diagnosis    Pemphigus vulgaris (CMS-HCC)    Pemphigus foliaceus (CMS-HCC)    Abnormal EKG    Class 2 severe obesity due to excess calories with serious comorbidity and body mass index (BMI) of 38.0 to 38.9 in adult (CMS-HCC)    Osteoarthritis of knee    Prediabetes    Nephrolithiasis    Vitreomacular adhesion of both eyes    Age-related nuclear cataract of both eyes    Dyslipidemia    Fatty liver    Personal history of COVID-19-July 2022    B12 deficiency    Female stress incontinence    Dry mouth    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)    Other specified congenital anomaly of skin    Current use of steroid medication    Recurrent HSV (herpes simplex virus)    Papanicolaou smear of cervix with low grade squamous intraepithelial lesion (LGSIL)    Anxiety and depression    Mixed hyperlipidemia    Low HDL (under 40)    Periodic limb movements of sleep       Past Surgical History:  Past Surgical History:   Procedure Laterality Date     CHOLECYSTECTOMY  2017    APPENDECTOMY  2002       Medications:    Current Outpatient Medications:     ALPRAZolam  (XANAX ) 0.5 MG tablet, Take 1 tablet (0.5 mg) by mouth nightly as needed for Insomnia., Disp: 30 tablet, Rfl: 2    baclofen  (LIORESAL ) 10 MG tablet, Take 1 tablet (10 mg) by mouth 3 times daily., Disp: 30 tablet, Rfl: 0    buPROPion  (WELLBUTRIN  XL) 150 MG XL tablet, Take 1 tablet (150 mg) by mouth every morning., Disp: 90 tablet, Rfl: 0    gabapentin  (NEURONTIN ) 100 MG capsule, Take 1-4 capsules by oral route once nightly 2 hours before bedtime. Start with 1 capsule (100mg ) and increased by 1 capsule every 7  days up to max 4 capsules (400mg ) once nightly, Disp: 90 capsule, Rfl: 1    Multiple Vitamin (MULTIVITAMIN) capsule, Take 1 capsule by mouth daily., Disp: 30 tablet, Rfl: 0    mycoPHENOLate  mofetil (CELLCEPT ) 500 MG tablet, Take 2 tablets (1,000 mg) by mouth 2 times daily., Disp: 360 tablet, Rfl: 3    naltrexone  (DEPADE) 50 MG tablet, Take 0.5 tablets (25 mg) by mouth daily. Take a half tablet daily, Disp: 45 tablet, Rfl: 0    niacinamide  500 MG tablet, Take 1 tablet (500 mg) by mouth 3 times daily (with meals)., Disp: 90 tablet, Rfl: 11    predniSONE  (DELTASONE ) 10 MG tablet, Take 3 tablets (30 mg) by mouth every morning., Disp: 42 tablet, Rfl: 0    valACYclovir  (VALTREX ) 1000 mg tablet, Take 1 tablet (1,000 mg) by mouth 2 times daily., Disp: 14 tablet, Rfl: 0    valACYclovir  (VALTREX ) 500 MG tablet, Take 1 tablet (500 mg) by mouth daily., Disp: 90 tablet, Rfl: 3    Allergies:  Allergies   Allergen Reactions    Ivig [Other] Other     Lymph nodes swollen        FAMILY HISTORY:  Family History   Problem Relation Name Age of Onset    Diabetes Mother          HTN, DJD    Neurological Disease (Parkinsons/Huntingtons/Huntingtons Chorea) Father          DM, alcoholism    Diabetes Father      No Known Problems Sister      Thyroid  Brother      Cataract M Grandfather      Cataract P Grandfather         SOCIAL  HISTORY:      reports that she has never smoked. She has never used smokeless tobacco. She reports that she does not currently use alcohol. She reports that she does not use drugs.    Review of Systems:  Review of Systems    Objective:     There were no vitals taken for this visit.    Physical Exam:  Physical Exam    Labs and Imaging:  Lab Results   Component Value Date    HGB 13.8 05/22/2023    HGB 13.8 05/22/2023    SODIUM 140 04/23/2022    K 4.5 05/22/2023    K 4.5 05/22/2023    CREAT 0.77 05/22/2023    CREAT 0.77 05/22/2023    GFR >60 04/23/2022    TSH 2.21 12/17/2022    AST 16 05/22/2023    AST 16 05/22/2023    ALT 13 05/22/2023    ALT 13 05/22/2023    TBILI 0.3 05/22/2023    TBILI 0.3 05/22/2023    A1C 5.6 12/17/2022    CHOL 183 12/17/2022    LDL 111 (H) 12/17/2022    TRIG 166 (H) 12/17/2022    HEPATITISCAB NON-REACTIVE 12/17/2022         Assessment & Plan:     There are no diagnoses linked to this encounter.        Routine Health Maintenance:  The 10-year ASCVD risk score (Arnett DK, et al., 2019) is: 1.5%    Values used to calculate the score:      Age: 69 years      Sex: Female      Is Non-Hispanic African American: No      Diabetic: No      Tobacco smoker: No  Systolic Blood Pressure: 129 mmHg      Is BP treated: No      HDL Cholesterol: 44 mg/dL      Total Cholesterol: 183 mg/dL    Health Maintenance   Topic Date Due    Influenza (1) 03/05/2023    COVID-19 Vaccine (4 - 2024-25 season) 04/05/2023    Breast Cancer Screen  07/08/2023    Annual AUDIT Screen  10/05/2023    Annual DAST Screen  10/05/2023    PHQ9 Depression Monitoring ([facility])  11/16/2023    Shingles Vaccine (1 of 2) 11/05/2023 (Originally 12/13/1991)    Cervical Cancer Screening  10/05/2026    Tetanus (2 - Td or Tdap) 03/30/2031    Colorectal Cancer Screening  01/09/2032    Hepatitis C Screening  Completed    Universal HIV Screening [place]  Completed    Pneumococcal Vaccine  Completed    HPV Vaccine <= 26 Yrs  Aged Out    Meningococcal MCV4  Vaccine  Aged Out       Requested Prescriptions      No prescriptions requested or ordered in this encounter       Follow Up: No follow-ups on file.    Future Appointments   Date Time Provider Department Center   10/30/2023  4:40 PM Princella Brooklyn, MD [facility]   02/09/2024  1:30 PM Gerard Knight, MD [facility]   02/22/2024 10:30 AM Hoyme, Lonzo Robinson, NP [facility]

## 2023-10-30 NOTE — Patient Instructions (Incomplete)
 It was nice to see you in my clinic today. Thank you for allowing me to take part in your care. If I may be of any further assistance, please do not hesitate to contact [phone]. If you have not done so already, please contact the front desk on your wa

## 2023-11-03 ENCOUNTER — Encounter: Payer: Self-pay | Admitting: Dermatology

## 2023-11-06 ENCOUNTER — Encounter: Admitting: Family Practice

## 2023-11-06 NOTE — Telephone Encounter (Signed)
 Spoke pt, and offer her an appointment for 12/01/23 at 10:40 am.    Pt was grateful for the called back.

## 2023-11-10 NOTE — Progress Notes (Signed)
 Samani Deal MD  [facility]  Date: Thursday November 12, 2023       Follow Up Visit Note    Chief Complaint: Medication Review    History of Present Illness:Patient is a 53 year old female w/ pemphigus vulgaris, pemphigus foliaceus, obesity, preDM, fatty liver, low B12, recurrent HSV, HLD here for aforementioned chief concern.     Needs refill of wellbutrin  for mood - reports mood is stable on current dose w/o side effects. Would like to continue. In addition, needs refill for gabapentin . Takes 100mg  daily as higher doses cause her to be drowsy. Has helped w/ anxiety. Uses xanax  rarely. In addition, has upcoming annual exam and will obtain labs prior. Had pap done recently w/ LSIL and recommended she proceed w/ colposcopy at [facility]. Moved to [place]. We discussed if drive to [place] is too bothersome/long, can consider going to [facility] for primary care.     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)    Other specified congenital anomaly of skin    Current use of steroid medication    Recurrent HSV (herpes simplex virus)    Papanicolaou smear of cervix with low grade squamous intraepithelial lesion (LGSIL)    Anxiety and depression    Mixed hyperlipidemia    Low HDL (under 40)    Periodic limb movements of sleep    LGSIL on Pap smear of cervix       Medications:  Outpatient Medications Marked as Taking for the 11/12/23 encounter (Office Visit) with  Elodia Haviland M, MD   Medication Sig Dispense Refill    ALPRAZolam  (XANAX ) 0.5 MG tablet Take 1 tablet (0.5 mg) by mouth nightly as needed for Insomnia. 30 tablet 2    buPROPion  (WELLBUTRIN  XL) 150 MG XL tablet Take 1 tablet (150 mg) by mouth every morning. 90 tablet 3    gabapentin  (NEURONTIN ) 100 MG capsule Take 1-4 capsules by oral route once nightly 2 hours before bedtime. Start with 1 capsule (100mg ) and increased by 1 capsule every 7 days up to max 4 capsules (400mg ) once nightly 360 capsule 1    Multiple Vitamin (MULTIVITAMIN) capsule Take 1 capsule by mouth daily. 30 tablet 0    mycoPHENOLate  mofetil (CELLCEPT ) 500 MG tablet Take 2 tablets (1,000 mg) by mouth 2 times daily. 360 tablet 3    valACYclovir  (VALTREX ) 500 MG tablet Take 1 tablet (500 mg) by mouth daily. 90 tablet 3       Allergies:  Allergies   Allergen Reactions    Ivig [Other] Other     Lymph nodes swollen        Family History:  Family History   Problem Relation Name Age of Onset    Diabetes Mother          HTN,  DJD    Neurological Disease (Parkinsons/Huntingtons/Huntingtons Chorea) Father          DM, alcoholism    Diabetes Father      No Known Problems Sister      Thyroid  Brother      Cataract M Grandfather      Cataract P Grandfather         Social History:   reports that she has never smoked. She has never used smokeless tobacco. She reports that she does not currently use alcohol. She reports that she does not use drugs.    Review of Systems:  Per HPI    Physical Exam:  Vitals:  BP 103/72 (BP Location: Left arm, BP Patient Position: Sitting, BP cuff size: Regular)   Pulse 77   Temp 98 F (36.7 C) (Oral)   Resp 16   Ht 5' (1.524 m)   Wt 90.8 kg (200 lb 4 oz)   LMP 10/25/2023   SpO2 98%   BMI 39.11 kg/m   GEN:No acute distress. Well developed, well nourished. Speaking in full sentences.  HEENT:  EOMI. Sclera anicteric  CV: regular rate and rhythm. S1 and S2 intensities normal. No murmurs, rubs or gallops.  Pulm: clear to  auscultation bilaterally. No wheezes, rales or rhonchi  Abd: soft, nontender, nondistended. Positive bowel sounds. No rebound, no guarding.  Extrem: warm, well perfused; no clubbing, cyanosis or edema. Pulses 2+ and symmetric  Skin: non-cyanotic, no jaundice, no rashes.   Psych: alert, in good spirits  Neuro: moving all extremities.     Assessment & Plan:  Bernese Doffing is a 53 year old female here for follow up.     1. LGSIL on Pap smear of cervix - reviewed the report. Recommended she proceed w/ colposcopy as recommended by hoag.     2. Periodic limb movements of sleep - stable on gabapentin . Continue.   - gabapentin  (NEURONTIN ) 100 MG capsule; Take 1-4 capsules by oral route once nightly 2 hours before bedtime. Start with 1 capsule (100mg ) and increased by 1 capsule every 7 days up to max 4 capsules (400mg ) once nightly  Dispense: 360 capsule; Refill: 1    3. Anxiety and depression - stable. Continue. CURES reviewed   - buPROPion  (WELLBUTRIN  XL) 150 MG XL tablet; Take 1 tablet (150 mg) by mouth every morning.  Dispense: 90 tablet; Refill: 3  - ALPRAZolam  (XANAX ) 0.5 MG tablet; Take 1 tablet (0.5 mg) by mouth nightly as needed for Insomnia.  Dispense: 30 tablet; Refill: 2  - CURES Review Documentation - I Reviewed CURES    4. Insomnia, unspecified type - stable. Does not really use xanax  often. CURES reviewed.   - ALPRAZolam  (XANAX ) 0.5 MG tablet; Take 1 tablet (0.5 mg) by mouth nightly as needed for Insomnia.  Dispense: 30 tablet; Refill: 2    5. Annual physical exam - for upcoming APE.   - Lipid Panel; Future  - Urinalysis With Culture Reflex, When Indicated; Future  - Hemoglobin A1C; Future  - Thyroid  Cascade; Future  - Comprehensive Metabolic Panel; Future  - CBC w/ Diff; Future  - Vitamin B12, Blood; Future  - Folic Acid , Blood; Future  - Iron/IBC Panel; Future  - Vitamin D , 25-OH Total; Future  - Screening Mammogram With Digital Breast Tomosynthesis - Bilateral; Future      Debby CHRISTELLA Ashworth, MD

## 2023-11-12 ENCOUNTER — Ambulatory Visit: Admitting: Family Practice

## 2023-11-12 ENCOUNTER — Encounter: Payer: Self-pay | Admitting: Family Practice

## 2023-11-12 VITALS — BP 103/72 | HR 77 | Temp 98.0°F | Resp 16 | Ht 60.0 in | Wt 200.2 lb

## 2023-11-12 DIAGNOSIS — F419 Anxiety disorder, unspecified: Secondary | ICD-10-CM

## 2023-11-12 DIAGNOSIS — G4761 Periodic limb movement disorder: Secondary | ICD-10-CM

## 2023-11-12 DIAGNOSIS — Z0000 Encounter for general adult medical examination without abnormal findings: Secondary | ICD-10-CM

## 2023-11-12 DIAGNOSIS — G4700 Insomnia, unspecified: Secondary | ICD-10-CM

## 2023-11-12 DIAGNOSIS — F32A Depression, unspecified: Secondary | ICD-10-CM

## 2023-11-12 DIAGNOSIS — R87612 Low grade squamous intraepithelial lesion on cytologic smear of cervix (LGSIL): Secondary | ICD-10-CM

## 2023-11-12 MED ORDER — BUPROPION XL (DAILY) 150 MG OR TB24
150.0000 mg | ORAL_TABLET | Freq: Every morning | ORAL | 3 refills | Status: AC
Start: 2023-11-12 — End: ?

## 2023-11-12 MED ORDER — ALPRAZOLAM 0.5 MG OR TABS
0.5000 mg | ORAL_TABLET | Freq: Every evening | ORAL | 2 refills | Status: AC | PRN
Start: 2023-11-12 — End: ?

## 2023-11-12 MED ORDER — GABAPENTIN 100 MG OR CAPS
ORAL_CAPSULE | ORAL | 1 refills | Status: DC
Start: 2023-11-12 — End: 2024-06-08

## 2023-11-12 NOTE — Patient Instructions (Signed)
 Fasting blood tests and urine tests  Proceed with colposcopy - take 1,000mg  of acetaminophen and 600mg  of advil 1 hour prior.   Mammogram ordered  Medications refilled  Dr. Marciano Sequin at [facility] is excellent if you want a doctor closer to home now that you moved to [place].

## 2023-11-12 NOTE — Interdisciplinary (Signed)
 Anita Turner is a 53 year old female who is here for a medication review

## 2023-11-16 ENCOUNTER — Other Ambulatory Visit
Admission: RE | Admit: 2023-11-16 | Discharge: 2023-11-16 | Disposition: A | Discharge status: Home Health | Attending: Anatomic Pathology & Clinical Pathology | Admitting: Anatomic Pathology & Clinical Pathology

## 2023-11-16 DIAGNOSIS — Z0000 Encounter for general adult medical examination without abnormal findings: Secondary | ICD-10-CM | POA: Insufficient documentation

## 2023-11-16 LAB — DIFFERENTIAL - ~~LOC~~
Basophils %: 1.2 % (ref 0.0–2.0)
Basophils Absolute: 0.1 10*3/uL (ref 0.0–0.2)
Eosinophils %: 3.2 % (ref 0.0–7.0)
Eosinophils Absolute: 0.3 10*3/uL (ref 0.0–0.5)
Lymphocytes %: 32.8 % (ref 14.0–52.0)
Lymphocytes Absolute: 2.6 10*3/uL (ref 0.9–3.3)
Monocytes %: 7.3 % (ref 1.0–11.0)
Monocytes Absolute: 0.6 10*3/uL (ref 0.0–0.8)
Neutrophils %: 55.5 % (ref 39.0–88.0)
Neutrophils Absolute: 4.4 10*3/uL (ref 2.0–8.1)

## 2023-11-16 LAB — HEMOGRAM, BLOOD - ~~LOC~~
HCT: 41.2 % (ref 34.0–44.0)
HGB: 13.8 g/dL (ref 11.5–15.0)
MCH: 29.2 pg (ref 27.0–33.5)
MCHC: 33.6 g/dL (ref 32.0–35.5)
MCV: 87 fl (ref 81.5–97.0)
MPV: 7.9 fl (ref 7.2–11.7)
PLT Count: 336 10*3/uL (ref 150–400)
RBC: 4.73 10*6/uL (ref 3.70–5.00)
RDW-CV: 14 % (ref 11.6–14.4)
WBC: 8 10*3/uL (ref 4.0–10.5)

## 2023-11-16 LAB — URINALYSIS WITH CULTURE REFLEX, WHEN INDICATED - ~~LOC~~
Bilirubin: NEGATIVE
Glucose: NEGATIVE mg/dL
Hemoglobin, Urine: NEGATIVE
Ketones, Urine: NEGATIVE mg/dL
Nitrite: NEGATIVE
Protein: NEGATIVE mg/dL
RBC: 1 /HPF (ref 0–3)
Specific Gravity, Urine: 1.003 (ref 1.003–1.030)
Squamous Epithelial: 1 /LPF (ref 0–10)
UA Culture: NEGATIVE
Urobilinogen: <2 mg/dL (ref <2)
WBC: 1 /HPF (ref <=5)
pH, Urine: 6.5 (ref 5.0–8.0)

## 2023-11-16 LAB — THYROID CASCADE - ~~LOC~~: TSH, Ultrasensitive: 1.86 u[IU]/mL (ref 0.450–4.120)

## 2023-11-16 LAB — COMPREHENSIVE METABOLIC PANEL, BLOOD - ~~LOC~~
ALT: 9 U/L (ref 7–52)
AST: 11 U/L — ABNORMAL LOW (ref 13–39)
Albumin: 3.3 g/dL — ABNORMAL LOW (ref 3.7–5.3)
Alkaline Phosphatase: 66 U/L (ref 34–104)
BUN: 8 mg/dL (ref 7–25)
Bilirubin, Total: 0.5 mg/dL (ref <=1.4)
Calcium: 9.1 mg/dL (ref 8.6–10.3)
Carbon Dioxide: 24 mmol/L (ref 21–31)
Chloride: 102 mmol/L (ref 98–107)
Creatinine: 0.7 mg/dL (ref 0.6–1.2)
Electrolyte Balance: 7 mmol/L (ref 2–12)
Glucose: 84 mg/dL (ref 70–115)
Potassium: 4.5 mmol/L (ref 3.5–5.1)
Protein, Total: 6.1 g/dL (ref 6.0–8.3)
Sodium: 133 mmol/L — ABNORMAL LOW (ref 136–145)
eGFR: 105 mL/min/{1.73_m2} (ref >=60)

## 2023-11-16 LAB — IRON/IBC PANEL - ~~LOC~~
% Saturation: 32 % (ref 20–55)
Ferritin: 29.8 ng/mL (ref 10.0–107.0)
Iron: 110 ug/dL (ref 37–170)
TIBC: 339 ug/dL (ref 284–507)
Transferrin: 242 mg/dL (ref 203–362)

## 2023-11-16 LAB — VITAMIN D, 25-OH TOTAL - ~~LOC~~: Vitamin D, 25-Hydroxy, Total: 44 ng/mL (ref >30.0)

## 2023-11-16 LAB — VITAMIN B12, BLOOD - ~~LOC~~: Vitamin B12: 189 pg/mL (ref 180–1241)

## 2023-11-16 LAB — FOLIC ACID, BLOOD - ~~LOC~~: Folate: 22.8 ng/mL (ref >5.9)

## 2023-11-16 LAB — LIPID(CHOL FRACT) PANEL, BLOOD - ~~LOC~~
Cholesterol: 157 mg/dL (ref <200)
HDL Cholesterol: 37 mg/dL — ABNORMAL LOW (ref >40)
LDL Cholesterol (calculated): 96 mg/dL (ref <160)
Non HDL Cholesterol (calculated): 120 mg/dL (ref <130)
Triglycerides: 118 mg/dL (ref <150)
VLDL Cholesterol (calculated): 24 mg/dL

## 2023-11-16 NOTE — Result Encounter Note (Signed)
 Hello,    Blood counts were normal. No anemia or evidence of blood cancer.    -Dr. Alana Hoyle

## 2023-11-16 NOTE — Result Encounter Note (Signed)
 Hello,    The electrolytes showed a slightly low sodium, which is commonly from dehydration, which we expect if you were fasting for the test. No alarming and something we will continue to monitor. Please try and drink 2 liters of water per day. Otherwise electrolytes were normal. Kidney function was normal. Liver function was normal. Your blood protein level (Albumin) was slightly low. We will await the results of the urine test to ensure you are not losing protein in the urine. This can be from not consuming enough protein in the diet as well, and thus if possible, please try and consume 70 to 80 grams of lean protein daily. Fasting glucose was normal and not suggestive of diabetes.     -Dr. Alana Hoyle

## 2023-11-17 LAB — HEMOGLOBIN A1C - ~~LOC~~: Hgb A1C: 5.2 % (ref 4.6–5.6)

## 2023-11-17 NOTE — Result Encounter Note (Signed)
 Hello,    Your urine was negative for blood, protein, sugar or infection. Although there was small leukocyte esterase, given the WBC in the urine was less than 10, infection is unlikely.    Your total cholesterol is at goal. Your HDL (the "good" cholesterol) is low and your LDL (the "bad" cholesterol is at goal. Your triglyceride level (another type of fat in the blood) is at goal. Your 10 year risk of heart attack or stroke remains low at 1% and thus cholesterol lowering medication is not indication. Please continue a low fat diet, limit sweets/sugars/alcohol and engage in regular exercise for at least 30 minutes 3-4 times per week, to keep your cholesterol levels stay healthy.  You can increase your HDL level through regular exercise activities, weight loss, and by dietary sources such as fish, nuts, and Omega 3 Fatty acids. You can reduce your LDL and Triglycerides by low fat diet and exercise.     Your vitamin B12 level was borderline low. I would recommend taking 1,000mcg of vitamin B12 by mouth every day for the next 3 months. This can be purchased over the counter at [employer].    Your folate level was normal.    You tested negative for diabetes based on your A1c test.    Your vitamin D level was normal.    Your thyroid function (TSH) was normal.    -Dr. Alana Hoyle

## 2023-11-18 ENCOUNTER — Ambulatory Visit (INDEPENDENT_AMBULATORY_CARE_PROVIDER_SITE_OTHER)

## 2023-11-18 DIAGNOSIS — L719 Rosacea, unspecified: Secondary | ICD-10-CM

## 2023-11-18 MED ORDER — METRONIDAZOLE 0.75 % EX CREA
1.0000 | TOPICAL_CREAM | Freq: Two times a day (BID) | CUTANEOUS | 2 refills | Status: DC
Start: 2023-11-18 — End: 2024-04-26

## 2023-11-18 NOTE — Progress Notes (Signed)
 PATIENTSadi Turner  MRN: [id number]  DOB: [birth year]  DATE OF SERVICE: 11/18/2023    REFERRING PRACTITIONER: Self, Referred  PRIMARY CARE PROVIDER: Herbert Moors Turner  CHIEF COMPLAINT: No chief complaint on file.        Subjective:     Anita Turner is Turner 53 year old female who was seen in clinic for:      Red rash on the cheeks that started in January, texture is more dry, spreading to the forehead. She has tried [employer] moisturizer and la roche posay but noticed the la roche made it redder and switched back to [place]. Has not tried anything for the rash. Uses an OTC depuffer roller topical which helps.Has not noticed any triggers that can flare the rash   She denies any new skin care products, medications, laundry detergents when this rash started.     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    Pemphigus vulgaris (CMS-HCC) 03/18/2019    Prediabetes 06/14/2018    Shortness of breath 03/18/2017    Strain of muscle of right hip 09/05/2016    Synovitis 02/06/2016     Past Surgical History:   Procedure Laterality Date    CHOLECYSTECTOMY  2017    APPENDECTOMY  2002     Family History   Problem Relation Name Age of Onset    Diabetes Mother          HTN, DJD    Neurological Disease (Parkinsons/Huntingtons/Huntingtons Chorea) Father          DM, alcoholism    Diabetes Father      No Known Problems Sister      Thyroid Brother      Cataract M Grandfather      Cataract P Grandfather       Social History     Socioeconomic History    Marital status: Married      Spouse name: Not on file    Number of children: Not on file    Years of education: Not on file    Highest education level: Not on file   Occupational History    Not on file   Tobacco Use    Smoking status: Never    Smokeless tobacco: Never   Vaping Use    Vaping status: Never Used   Substance and Sexual Activity    Alcohol use: Not Currently    Drug use: Never    Sexual activity: Yes     Partners: Male   Other Topics Concern    Not on file   Social History Narrative    Married, one teenager daughter, no pets, homemaker, yoga, walks for exercise when her knee does not hurt         Social Drivers of Health     Financial Resource Strain: Not on file   Food Insecurity: No Food Insecurity (12/25/2022)    Hunger Vital Sign     Worried  About Running Out of Food in the Last Year: Never true     Ran Out of Food in the Last Year: Never true   Transportation Needs: No Transportation Needs (12/25/2022)    PRAPARE - Therapist, art (Medical): No     Lack of Transportation (Non-Medical): No   Physical Activity: Not on file   Stress: Not on file   Social Connections: Not on file   Intimate Partner Violence: Low Risk  (12/25/2022)    [facility] IPV     Have you ever been emotionally or physically abused by your partner or someone important to you?: No     Within the last year have you been hit, slapped, kicked or otherwise physically hurt by someone?: No     Since you've been pregnant, have you been slapped, kicked or otherwise physically hurt by someone?: Not on file     Within the last year has anyone forced you to have sexual activities?: No     Are you afraid of your spouse or partner you listed above?: No   Housing Stability: Low Risk  (12/25/2022)    Housing Stability Vital Sign     Unable to Pay for Housing in the Last Year: No     Number of Places Lived in the Last Year: 1     Unstable Housing in the Last Year: No      ALPRAZolam Take 1 tablet (0.5 mg) by mouth nightly as needed for Insomnia. 30 tablet 2     buPROPion Take 1 tablet (150 mg) by mouth every morning. 90 tablet 3    gabapentin Take 1-4 capsules by oral route once nightly 2 hours before bedtime. Start with 1 capsule (100mg ) and increased by 1 capsule every 7 days up to max 4 capsules (400mg ) once nightly 360 capsule 1    multivitamin Take 1 capsule by mouth daily. 30 tablet 0    mycoPHENOLate mofetil Take 2 tablets (1,000 mg) by mouth 2 times daily. 360 tablet 3    niacinamide Take 1 tablet (500 mg) by mouth 3 times daily (with meals). 90 tablet 11    valACYclovir Take 1 tablet (1,000 mg) by mouth 2 times daily. 14 tablet 0    valACYclovir Take 1 tablet (500 mg) by mouth daily. 90 tablet 3     Allergies   Allergen Reactions    Ivig [Other] Other     Lymph nodes swollen        Review of Systems:  Skin:  No other changing skin lesions      Objective:       General: Pleasant, in no acute distress, appears stated age  Psychiatric: Mood and affect are within normal limits. Alert and oriented x 3  Skin Exam:  Skin Type: 3  Skin examined as noted below:  Scalp: examined  Head/face: examined  All areas examined were within normal limits with the following exceptions:   -Bilateral cheeks with persistent erythema, no comedones, dryness or scaling noted.     Assessment/Plan       #. Rosacea  -Discussed diagnosis with patient  -Discussed importance of identifying and avoiding possible triggers  -START Metronidazole 0.75% cream BID   - Sensitive skin care discussed with patient, including lukewarm showers, use of bland emollients, use of fragrance free skin products, like Cereve, Cetephil or Vanicream products on the face        The above plan of care, diagnosis, orders, and follow-up  were discussed with the patient.  Questions related to this recommended plan of care were answered.    RTC 2 months or sooner prn      Author:  Mete Purdum Kristine Chelci [place], NP 11/18/2023 1:08 PM

## 2023-11-18 NOTE — Patient Instructions (Addendum)
 SKIN DIET      You have been prescribed a skin diet, what this means is you can only use products on this list. If you use other products even once per month, it is possible your skin rash will return.     Moisturizers:   Cerave moisturing cream/ body lotion  Vanicream lotion or moisturizing cream  Vaniply ointment  Neutrogena [place] Formula Hand Cream (Fragrance Free Only)  Vaseline Petroleum Jelly    Cleansers:   Free and Clear Liquid Cleanser   Neutrogena Ultra Gentle Hydrating Cleanser  Cerave Hydrating Cleanser   Albolene Moisturizing Cleanser   Vanicream Moisturizing Bar    Shampoo:   Free and Clear Shampoo and Conditioner  AMF Safe Choice Shampoo    Laundry detergent:   Free and Clear       Department of Dermatology, [facility], [place]   [phone]    Recommend mineral based sunscreen with zinc oxide and titanium oxide            SUNSCREEN Tips:    Wear sunscreen SPF 30 or higher every day, sun penetrates window glass, you can incur damage while in your car or office  Some light brands for daily use on the face I like are:  Replenix Sheer Physical SPF 50+  Elta MD UV Clear SPF 46  Some thick brands for use with physical activity or water activity (reapply every few hours, no matter what SPF):  Vanicream (any 30 or higher with 80 minutes water resistance)  Solbar Shield SPF 40    Sunscreens that contain iron oxide (helpful for skin discoloration):  - Skinceuticals Physical Fusion UV defense SPF 50  - Tizo 3 Tinted Face Mineral SPF 40 Sunscreen  - Neostrata Sheer physical protection SPF 50  - Exuviance sheer daily protection SPF 50 PA++++  - Supergoop! Daily Correct CC Cream SPF 35  - SkinMedica Essential Defense Mineral Shield SPF 32  - Dermalogica Light Sheer Tint Moisture SPF 20 Face Moisturiser  - Cotz Face Natural Skin Tone SPF 40  - MDSolarSciences Mineral [place]rme Broad Spectrum SPF 50 Sunscreen    Sun Protection Guidelines:  - Avoid sun exposure between 10 am-2 pm, when the  sun is the strongest. If you are doing outdoor activities, try to do them early morning or later afternoon / early evening.   - Sunscreens have been proven to decrease skin cancers (including melanoma) and photoaging.     Sunscreens:  - The American Academy of Dermatology recommends "SPF 30 - Broad Spectrum" or higher SPF.   - Apply sunscreen 15 minutes before sun exposure.   - Reapply every 2 hours, especially when outdoors. Sunscreen is less effective after 2 hours.   - Sunblock with zinc and titanium dioxide are the least irritating especially for sensitive skin (rosacea, eczema, seborrheic dermatitis).

## 2023-11-20 ENCOUNTER — Ambulatory Visit
Admission: RE | Admit: 2023-11-20 | Discharge: 2023-11-20 | Disposition: A | Discharge status: Home Health | Attending: Family Practice | Admitting: Family Practice

## 2023-11-20 DIAGNOSIS — Z1231 Encounter for screening mammogram for malignant neoplasm of breast: Secondary | ICD-10-CM | POA: Insufficient documentation

## 2023-11-20 DIAGNOSIS — Z803 Family history of malignant neoplasm of breast: Secondary | ICD-10-CM | POA: Insufficient documentation

## 2023-11-20 DIAGNOSIS — Z0000 Encounter for general adult medical examination without abnormal findings: Secondary | ICD-10-CM | POA: Insufficient documentation

## 2023-11-23 NOTE — Result Encounter Note (Signed)
 Hello,    Good news!  Your recent mammogram study was normal. There were no suspicious masses, calcifications, or other abnormalities found on your exam. I recommend your next mammogram for breast cancer screening be done in 1 year.    Best,  Dr. Nori Winegar

## 2023-11-29 NOTE — Progress Notes (Signed)
 Department of Dermatology  [facility]  PATIENT: Anita Turner  MRN: [id number]  DOB: [birth year]  DATE OF SERVICE: 11/30/2023    REFERRING PRACTITIONER: Justin Olszewski  PRIMARY CARE PROVIDER: Ruel Cotta A  CHIEF COMPLAINT: No chief complaint on file.    Subjective:     Anita Turner is a 53 year old female who was seen in clinic for a rash on face eval    L/V on 11/18/2023 with Dr. Jodelle Mungo, Veria Glassing, MD. Discussed red rash on the cheeks that started in January, texture is more dry, spreading to the forehead. She has tried [employer] moisturizer and la roche posay but noticed the la roche made it redder and switched back to [place]. Has not tried anything for the rash. Uses an OTC depuffer roller topical which helps.Has not noticed any triggers that can flare the rash   She denies any new skin care products, medications, laundry detergents when this rash started.     Objective:   General: Pleasant, in no acute distress, appears stated age  Respiratory: No increased work of breathing    There were no vitals filed for this visit.    Skin Exam:  Pertinent skin findings below:  ***    Assessment/Plan   #. Rosacea  -Discussed diagnosis with patient  -Discussed importance of identifying and avoiding possible triggers  -START Metronidazole  0.75% cream BID   - Sensitive skin care discussed with patient, including lukewarm showers, use of bland emollients, use of fragrance free skin products, like Cereve, Cetephil or Vanicream products on the face        The above plan of care, diagnosis, orders, and follow-up were discussed with the patient.  Questions related to this recommended plan of care were answered.    RTC *** months or sooner prn    Scribe Attestation  The notes I am recording reflect only actions made by and judgments taken by this provider, Dr. Jesse Moritz, for whom I am scribing today.  I have performed no independent clinical work.    Vertell Gory

## 2023-11-30 ENCOUNTER — Ambulatory Visit (INDEPENDENT_AMBULATORY_CARE_PROVIDER_SITE_OTHER): Admitting: Dermatology

## 2023-11-30 VITALS — BP 115/61 | HR 86 | Wt 203.0 lb

## 2023-11-30 DIAGNOSIS — R21 Rash and other nonspecific skin eruption: Secondary | ICD-10-CM

## 2023-11-30 DIAGNOSIS — L100 Pemphigus vulgaris: Secondary | ICD-10-CM

## 2023-11-30 DIAGNOSIS — Z79899 Other long term (current) drug therapy: Secondary | ICD-10-CM

## 2023-12-01 ENCOUNTER — Other Ambulatory Visit
Admission: RE | Admit: 2023-12-01 | Discharge: 2023-12-01 | Disposition: A | Discharge status: Home Health | Attending: Anatomic Pathology & Clinical Pathology | Admitting: Anatomic Pathology & Clinical Pathology

## 2023-12-01 ENCOUNTER — Ambulatory Visit: Admitting: Dermatology

## 2023-12-01 DIAGNOSIS — Z79899 Other long term (current) drug therapy: Secondary | ICD-10-CM | POA: Insufficient documentation

## 2023-12-03 LAB — PEMPHIGUS ANTIBODY PANEL, IGG - ~~LOC~~

## 2023-12-08 ENCOUNTER — Encounter (INDEPENDENT_AMBULATORY_CARE_PROVIDER_SITE_OTHER): Payer: Self-pay | Admitting: Dermatology

## 2023-12-08 ENCOUNTER — Encounter: Payer: Self-pay | Admitting: Dermatology

## 2023-12-08 DIAGNOSIS — L109 Pemphigus, unspecified: Secondary | ICD-10-CM

## 2023-12-08 LAB — ANTI-NUCLEAR ANTIBODY (ANA), BLOOD - ~~LOC~~: Antinuclear Antibody (ANA), HEp-2000, IgG: NEGATIVE

## 2023-12-08 NOTE — Telephone Encounter (Signed)
 Good afternoon Dr Jesse Moritz,    Pt is asking about her recent labs from 12/01/23.    Thank you,  Geophysical data processor

## 2023-12-08 NOTE — Telephone Encounter (Signed)
This patient gave consent for this Medical Advice message and is aware that it may result in a bill to their insurance, as well as the possibility of receiving a bill for a co-pay and/or deductible. They are an established patient and are not seeking information exclusively about a problem treated during an in person or video visit in the last seven days. I do not anticipate an in-person or video visit regarding this same issue within seven days of my reply.     See the MyChart message from patient and my reply for my assessment and plan.  Patient-initiated message date: on file    I spent a total of  23 minutes reviewing the patient's prior medical records and current request for medical advice, prescribing medications, or ordering tests (if applicable), replying to the patient, and documenting the encounter.

## 2023-12-09 ENCOUNTER — Encounter: Payer: Self-pay | Admitting: Dermatology

## 2023-12-09 NOTE — Telephone Encounter (Signed)
 Good afternoon Dr Jesse Moritz,    1) Pt asking if she can see you for Rosacea since Lupus has been ruled out?    2) Taking Valtrex , 500 mg daily. Pt stating that since her Pemphigus labs are negative, OK to stop Valtrex ?     Thank you,  Geophysical data processor

## 2023-12-09 NOTE — Telephone Encounter (Signed)
 Good morning Dr Jesse Moritz,    Pt is asking about the labs from 12/01/23, not 11/16/23.    Thank you,  Geophysical data processor

## 2023-12-11 NOTE — Telephone Encounter (Signed)
 Left message for pt that Dr Jesse Moritz said OK to both her questions.  Asked to call the [facility], gave the number, to schedule.

## 2023-12-11 NOTE — Progress Notes (Unsigned)
 Abdulah Iqbal MD  [facility]  Date of Visit: Monday Dec 14, 2023    Annual Physical/ Well Woman Exam    Chief Concern:   Chief Complaint   Patient presents with    Physical        History of Present Illness:  Anita Turner is a 53 year old female who presents for annual well woman exam.  Had blood work completed prior. Having right knee pain and would like to do PT. History of moderate to severe OA right knee. No trauma/fall/redness.     Health Care Maintenance  Screening  Cervical [place] Screening - 10/04/2021 - LSIL. Completed colposcopy 10/2023 at [facility]  Breast [place] Screening-  BI-RADS 1 11/21/2023.    Osteoporosis screening:  due given prior steroid use.   GC/CT screening - n/a  Folate acid supplementation - encouraged  Intimate Partner Violence screening -  n/a    Lung [place] screening - n/a   Colon  screening - 01/08/2022 - due 10 years (2033)   Tobacco Use Screening - Non-smoker  Alcohol Use Screening - Negative   DM screening - A1c 5.2 (04/14)  Drug Use Screening -  Negative   Depression Screening -  Negative  Obesity screening -  Body mass index is 39.06 kg/m.  HIV screening -  NR 08/2021.  Hepatitis C screening - NR 12/2022   Latent TB screening -  n/a   Lipid screening: Cholesterol 157 (04/14) HDL   LDL 96 (04/14) Triglycerides 118 (04/14)     Vaccinations:  Influenza vaccine: n/a  COVID-19: due  TDaP -  2022  Shingles- due  Pneumococcal vaccine: PCV-20 09/11/2022  Hepatitis A:  n/a   Hepatitis B:  n/a   HPV vaccine: n/a  Meningococcal: n/a    I have fully reviewed the past medical, surgical, social and family history and updated the Histories section of EpicCare.    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    OB History  OB History   Gravida Para Term Preterm AB Living   2 1  1 0 1 1   SAB IAB Ectopic Multiple Live Births   0 1 0 0 0       Family History:   Family History   Problem Relation Name Age of Onset    Diabetes Mother          HTN, DJD    Neurological Disease (Parkinsons/Huntingtons/Huntingtons Chorea) Father          DM, alcoholism    Diabetes Father      No Known Problems Sister      Thyroid  Brother      Cataract M Grandfather      Cataract P Grandfather         Allergies: Ivig [other]    Medications:    Current Outpatient Medications:     ALPRAZolam  (XANAX ) 0.5 MG tablet, Take 1 tablet (0.5 mg) by mouth nightly as needed for Insomnia., Disp: 30 tablet, Rfl: 2    buPROPion  (WELLBUTRIN  XL) 150 MG XL tablet, Take 1 tablet (150 mg) by mouth every morning., Disp: 90 tablet, Rfl: 3    gabapentin  (NEURONTIN ) 100 MG capsule, Take 1-4 capsules by oral route once nightly 2 hours before bedtime. Start with 1 capsule (100mg ) and increased by 1 capsule every 7 days up to max 4 capsules (400mg ) once nightly, Disp: 360 capsule, Rfl: 1    metroNIDAZOLE  (METROCREAM ) 0.75 % topical cream, Apply 1 Application.  topically 2 times daily. Use a small amount as directed on the face, Disp: 45 g, Rfl: 2    Multiple Vitamin (MULTIVITAMIN) capsule, Take 1 capsule by mouth daily., Disp: 30 tablet, Rfl: 0    mycoPHENOLate  mofetil (CELLCEPT ) 500 MG tablet, Take 2 tablets (1,000 mg) by mouth 2 times daily., Disp: 360 tablet, Rfl: 3    niacinamide  500 MG tablet, Take 1 tablet (500 mg) by mouth 3 times daily (with meals). (Patient not taking: Reported on 12/14/2023), Disp: 90 tablet, Rfl: 11    valACYclovir  (VALTREX ) 1000 mg tablet, Take 1 tablet (1,000 mg) by mouth 2 times daily. (Patient not taking: Reported on 12/14/2023), Disp: 14 tablet, Rfl: 0    valACYclovir  (VALTREX ) 500 MG tablet, Take 1 tablet (500 mg) by mouth daily., Disp: 90 tablet, Rfl: 3    Social History:   Social History     Socioeconomic History    Marital status: Married     Spouse name: Not on file    Number of children: Not on file    Years of education: Not on file    Highest education level: Not on file   Occupational History    Not on file   Tobacco Use    Smoking status: Never    Smokeless tobacco: Never   Vaping Use    Vaping status: Never Used   Substance and Sexual Activity    Alcohol use: Not Currently    Drug use: Never    Sexual activity: Yes     Partners: Male   Other Topics Concern    Not on file   Social History Narrative    Married, one teenager daughter, no pets, homemaker, yoga, walks for exercise when her knee does not hurt         Social Drivers of Health     Financial Resource Strain: Not on file   Food Insecurity: No Food Insecurity (12/25/2022)    Hunger Vital Sign     Worried About Running Out of Food in the Last Year: Never true     Ran Out  of Food in the Last Year: Never true   Transportation Needs: No Transportation Needs (12/25/2022)    PRAPARE - Therapist, art (Medical): No     Lack of Transportation (Non-Medical): No   Physical Activity: Not on file   Stress: Not on file   Social Connections: Not on file   Intimate  Partner Violence: Low Risk  (12/25/2022)    [facility] IPV     Have you ever been emotionally or physically abused by your partner or someone important to you?: No     Within the last year have you been hit, slapped, kicked or otherwise physically hurt by someone?: No     Since you've been pregnant, have you been slapped, kicked or otherwise physically hurt by someone?: Not on file     Within the last year has anyone forced you to have sexual activities?: No     Are you afraid of your spouse or partner you listed above?: No   Housing Stability: Low Risk  (12/25/2022)    Housing Stability Vital Sign     Unable to Pay for Housing in the Last Year: No     Number of Places Lived in the Last Year: 1     Unstable Housing in the Last Year: No          Review of Systems (ROS):   12 point ROS negative    Physical Exam  Vital signs: BP 109/73 (BP Location: Left arm, BP Patient Position: Sitting, BP cuff size: Regular)   Pulse 93   Temp 98 F (36.7 C) (Oral)   Resp 14   Ht 5' (1.524 m)   Wt 90.7 kg (200 lb)   LMP 11/23/2023   SpO2 99%   BMI 39.06 kg/m  Body mass index is 39.06 kg/m.   General Appearance: The patient is well-developed, well-nourished, and in no acute distress at the time of the exam. She is able to sit comfortably on the examination table without difficulty.  Skin: Gross inspection of skin demonstrates no evidence of abnormality. Hair and nails are normal. Skin is warm and dry.   Head: Patient's head is normocephalic, without any gross bulging or retraction noted.   Eyes: Pupils are equal, round, reactive to light and accomodation. Extraocular muscles are intact. Eyelids appear normal with no signs of ptosis, lidlag, or lid edema.   ENT: Both ears appear grossly normal, canal are clear, tympanic membranes are intact without abnormality, Nasal mucosa is clear, no lesions or discharge. Throat, oral cavity and tongue normal.   Neck:  Neck supple. No adenopathy or masses in the neck or supraclavicular regions.   Neck with full range of motion, No thyromegaly or thyroid  masses palpated. No JVD is present.   Respiratory: Lungs are clear to auscultation bilaterally. No wheezing, rales, rubs, or rhonchi noted. Good inspiratory effort noted and thorax rises symmetrically.   Cardiovascular:  Normal sinus rhythm with a normal S1 and S2 without audible click, murmur or rub. No S3 auscultated.  Abdomen:  The abdomen is soft, non-tender without rebound, gaurding, or ridgidity. Normal bowel sounds are appreciated in all quadrants, no bruits heard. No palpable masses or hepatosplenomegaly.   Extremities:  Extremities, and peripheral pulses are normal. No edema.   Neuro: Grossly normal    Assessment & Plan:    1. Annual physical exam (Primary) -  Healthy adult female. Normal exam today. Counseled on healthy lifestyle including diet and exercise, regular sun protection  using SPF30+, folic acid  supplementation if of child bearing age, screening tests if applicable such as lab work, pap smears, colonoscopies, mammograms, bone scans, as well as the importance of vaccines. Screening labs/studies/vaccines ordered as below. All questions were answered to patient's satisfaction and she understands plan and follow up schedule.  - X-RAY Dexa (Bone Density) Skeletal; Future  - shingles [facility]) - Admin Now  - shingles [facility]) - Admin in 2-6 Months; Future    2. Need for vaccination  - shingles [facility]) - Admin Now  - shingles [facility]) - Admin in 2-6 Months; Future        Follow up: Return to clinic in 3  month(s) and PRN.    Dayzee Trower M Lucus Lambertson, MD

## 2023-12-14 ENCOUNTER — Ambulatory Visit: Admitting: Family Practice

## 2023-12-14 ENCOUNTER — Encounter: Payer: Self-pay | Admitting: Family Practice

## 2023-12-14 VITALS — BP 109/73 | HR 93 | Temp 98.0°F | Resp 14 | Ht 60.0 in | Wt 200.0 lb

## 2023-12-14 DIAGNOSIS — M1711 Unilateral primary osteoarthritis, right knee: Secondary | ICD-10-CM

## 2023-12-14 DIAGNOSIS — Z23 Encounter for immunization: Secondary | ICD-10-CM

## 2023-12-14 DIAGNOSIS — Z0000 Encounter for general adult medical examination without abnormal findings: Secondary | ICD-10-CM

## 2023-12-14 NOTE — Patient Instructions (Addendum)
 1,000mcg daily of B12 for 3 months  Bone density scan  Get me copy of last pap smear and colpscopy.  Physical therapy for right knee.   [facility] are excellent  [email or web address]  Shingles #1 today. #2 due in 2 to 6 months from now.  Your labs showed you have iron deficiency. Please take ferrous sulfate 325mg  (which contains 65mg  of elemental iron)--2 pills (take them at the same time), every other day. This can be purchased over the counter, without a prescription. Do not take within 30 minutes of any foods or supplements that contain calcium. Best taken on an empty stomach. It can cause constipation and darkening of the stools, thus please maintain good hydration, and if you become constipated take stool softener like docusate (available over the counter).

## 2023-12-14 NOTE — Interdisciplinary (Signed)
SHINGRIX VACCINE LABELED AND VERIFIED, CFABELA, LVN

## 2023-12-14 NOTE — Interdisciplinary (Signed)
 Anita Turner is a 53 year old female who is here for a CPE

## 2023-12-25 ENCOUNTER — Ambulatory Visit
Admission: RE | Admit: 2023-12-25 | Discharge: 2023-12-25 | Disposition: A | Discharge status: Home / Self Care | Attending: Family Practice | Admitting: Family Practice

## 2023-12-25 DIAGNOSIS — M8589 Other specified disorders of bone density and structure, multiple sites: Secondary | ICD-10-CM | POA: Insufficient documentation

## 2023-12-25 DIAGNOSIS — Z0000 Encounter for general adult medical examination without abnormal findings: Secondary | ICD-10-CM | POA: Insufficient documentation

## 2023-12-25 NOTE — Result Encounter Note (Signed)
 Hi,    Your bone density revealed normal bone strength for your age, which is good news.    -Dr. DELENA

## 2024-01-04 ENCOUNTER — Telehealth: Payer: Self-pay | Admitting: Dermatology

## 2024-01-04 NOTE — Telephone Encounter (Signed)
 [phone]  requesting to advise  MD/Nurse regarding patient rx mycoPHENOLate  mofetil (CELLCEPT ) 500 MG tablet . They have been trying to contact the patient to provide the rx but they have not been able to reach the patient. The have the same contact number we have on file. She advised if the doctor has any questions they can call [phone].  Please assist.

## 2024-01-27 ENCOUNTER — Encounter: Admitting: Nurse Practitioner

## 2024-02-09 ENCOUNTER — Ambulatory Visit: Admitting: Dermatology

## 2024-02-22 ENCOUNTER — Encounter: Payer: Self-pay | Admitting: Nurse Practitioner

## 2024-04-06 ENCOUNTER — Encounter: Payer: Self-pay | Admitting: Family Practice

## 2024-04-18 ENCOUNTER — Ambulatory Visit (INDEPENDENT_AMBULATORY_CARE_PROVIDER_SITE_OTHER): Admitting: Dermatology

## 2024-04-18 ENCOUNTER — Telehealth: Payer: Self-pay | Admitting: Dermatology

## 2024-04-18 VITALS — BP 142/76 | HR 78

## 2024-04-18 DIAGNOSIS — Z79899 Other long term (current) drug therapy: Secondary | ICD-10-CM

## 2024-04-18 DIAGNOSIS — L100 Pemphigus vulgaris: Secondary | ICD-10-CM

## 2024-04-18 MED ORDER — TRIAMCINOLONE ACETONIDE 40 MG/ML IJ SUSP
120.0000 mg | Freq: Once | INTRAMUSCULAR | Status: AC
Start: 2024-04-18 — End: 2024-04-18
  Administered 2024-04-18: 120 mg via INTRAMUSCULAR
  Filled 2024-04-18: qty 3

## 2024-04-18 MED ORDER — TRIAMCINOLONE ACETONIDE 10 MG/ML IJ SUSP
10.0000 mg | Freq: Once | INTRAMUSCULAR | Status: AC
Start: 2024-04-18 — End: 2024-04-18
  Administered 2024-04-18: 10 mg via INTRALESIONAL
  Filled 2024-04-18: qty 1

## 2024-04-18 NOTE — Telephone Encounter (Signed)
 Patient is requesting to be accommodated in an appointment sooner than the next available in December.     Caller states she has a Pemphigus break out.     Please assist, thank you.

## 2024-04-18 NOTE — Telephone Encounter (Signed)
 Patient states she is having lesions on her scalp. 3-4 lesions she noticed yesterday morning with pain and burning sensation. 1 lesion being fluid filled and redness on scalp observed by daughter. Denies any other symptoms. Patient was told by Dr. Arlean in the past if there were any new lesions to contact clinic for a f/u appt. Patient is not on any medication at the moment from dermatology. Was told to discontinue in May/April per Dr. Arlean via blood work. Scheduled with Dr. Grando today at [facility], ok to book per MA.

## 2024-04-18 NOTE — Progress Notes (Signed)
 Department of Dermatology  [facility]  PATIENT: Anita Turner  MRN: [id number]  DOB: 1970-11-07  DATE OF SERVICE: 04/18/2024    REFERRING PRACTITIONER: Arlean Radames LABOR  PRIMARY CARE PROVIDER: Julene Clotilda LABOR  CHIEF COMPLAINT:   Chief Complaint   Patient presents with    Lesion     Scalp     Subjective:     Anita Turner is a 53 year old female who was seen in clinic for lesions on scalp evaluation.    LOV 11/30/23 planned to order pemphigus panel and ANA. Results posted below.    Today, patient states that they noticed a scab on their scalp. They also feel a painful sensation from this. The way her pemphigus initially started was as a scab on her scalp back in 2015.    Objective:   General: Pleasant, in no acute distress, appears stated age  Respiratory: No increased work of breathing    There were no vitals filed for this visit.      Chaperone Documentation  A trained chaperone was present for this exam/procedure?: No (04/18/2024  2:04 PM)  Reason why a trained chaperone was not present?: Patient declined (04/18/2024  2:04 PM)      Skin Exam:  Pertinent skin findings below:  - Stuck on pustule on scalp     02/07/2019   Basement Membrane Zone (BMZ) IgG, IgG4, and IgA  IgG:  Negative, monkey esophagus substrate        Negative, human split skin substrate      IgG4:  Negative, monkey esophagus substrate             Negative, human split skin substrate   IgA:  Negative, monkey esophagus substrate        Negative, human split skin substrate   Positive IgG, including IgG4, cell surface antibodies,                monkey esophagus substrate      Enzyme Linked Immunosorbent Assay (ELISA)   --------------------------------------------------   Bullous Pemphigoid (BP) 180 and 230 IgG Antibodies   IgG BP 180 antibodies:  1 unit  NEGATIVE  IgG BP 230 antibodies:  1 unit  NEGATIVE     02/07/2019  Cell Surface IgG Antibodies   IgG:  Positive, titer 1:2560 (H), monkey esophagus substrate        Positive, titer 1:640 (H), intact  human skin substrate      Enzyme-Linked Immunosorbent Assay (ELISA)  -----------------------------------------  Desmoglein (DSG) 1 and 3 IgG Antibodies   IgG desmoglein 1 antibodies:   7 units  NEGATIVE    IgG desmoglein 3 antibodies: 320 units (H)  POSITIVE         (Initial level, 139 units, greater than high                calibrator; diluted to achieve 64 units, within          assay calibrators, and multiplied by the dilution          factor of 5)     11/30/23-    DIAGNOSTIC INTERPRETATION       Negative/normal Pemphigus Antibody Panel, IgG     ____________________________________________________________      RESULTS   Indirect Immunofluorescence (IIF)   -----------------------------------------------------------   Cell Surface (CS)/Intercellular Substance (ICS) IgG Antibodies       IgG:  Negative, monkey esophagus substrate         Negative, intact human skin substrate  Enzyme-Linked Immunosorbent Assay (ELISA)   -----------------------------------------------------------   Desmoglein (DSG) 1 and 3 IgG Antibodies       IgG desmoglein 1 antibody level:  6 U/mL           IgG desmoglein 3 antibody level:  2 U/mL       ANA- NEGATIVE       Assessment/Plan     #Rash NOS, query lupus vs rosacea  - Labs ordered LV: ANA     # Pemphigus vulgaris  # High risk medication use  Currently well-controlled with no flares  Not on prednisone    Patient followed by Dr. Thaddeus and prior labwork including serologies for pemphigus/pemphigoid, CBC, CMP. She was previously on rituxan . 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      - S/p cellcept  1000 mg BID for now (discussed can discontinue pending lab results as below)  - Pemphigus, Pemphigoid, Collagen IIV labs ordered  - Intralesional injection of kenalog  performed after verbal consent obtained. Risks/benefits/alternatives of the treatment were discussed.  Discussed with patient risks of atrophy, telangectasias and hypopigmentation, and that the purpose of ILK is to decrease inflammation. Areas were cleansed with isopropyl alcohol prior to the procedure. Patient tolerated well.  Dose: 10 mg/cc  Total: 10 cc  Site: scalp  - IMK 120 mg today       The above plan of care, diagnosis, orders, and follow-up were discussed with the patient.  Questions related to this recommended plan of care were answered.    RTC 1 month or sooner prn  Scribe Attestation  The notes I am recording reflect only actions made by and judgments taken by this provider, Dr. Arlean, for whom I am scribing today.  I have performed no independent clinical work.    Lauraine Slack    I have seen, evaluated, and performed a history and physical as well as formulated an assessment and plan with the resident and I agree with the assessment and plan above.  I attest that I was physically present at any procedures performed. I fully participated in the management of the patient. High level of complexity. Regular monitoring for possible liver/kidney and blood cell toxicities of immunosuppressors and making adjustments as needed.    Radames Arlean, MD, PhD

## 2024-04-25 NOTE — Progress Notes (Signed)
 Kaleth Koy MD  [facility]  Date: Tuesday April 26, 2024      Sick Visit Note    Chief Complaint: Back Pain (Lower back pain)      History of Present Illness:Patient is a 53 year old female w/ pemphigus vulgaris, pemphigus foliaceus, obesity, preDM, fatty liver, low B12, recurrent HSV, HLD, OA, MDD/GAD on wellbutrin , RLS on gabapentin  here for back pain.    #Iron deficiency - ferritin <40 11/2023. Not using iron supplementation.    #Low B12 - B12 low to 189 11/2023. Compliant w/ supplementation.    #Vitamin D  deficiency - not taking supplementation regularly.     #Back pan - present for a few days. Pain rated 4/10.  No current pain but in the morning can have stiffness.  Improved w/ massage. No rash. No blood in urine. Doing yoga which has helped. Defers PT. No saddle anesthesia, weakness in LE or incontinence. Can radiate to left leg sometimes. No rash.     #Headaches - in the past week, has been waking up in the morning with headaches. Can be pretty bad and needing OTC medications (ibuprofen and tylenol ). Uses mouthguard at times. Starts in the middle of her forehead and radiates to one side. Unsure about snoring but sleeps in her own room. Wakes up feeling tired. OSA testing negative 10/02/2022.  No paresthesias, weakness, slurred speech. No light or sound sensitivity. No vomiting/nausea. No FHx of migraines. Does not check her blood pressure when she does have headaches. No vision changes.     Past Medical and Surgical History;  Problem List[1]    Medications:  Outpatient Medications Marked as Taking for the 04/26/24 encounter (Office Visit) with Mikalah Skyles M, MD   Medication Sig Dispense Refill    ALPRAZolam  (XANAX ) 0.5 MG tablet Take 1 tablet (0.5 mg) by mouth nightly as needed for Insomnia. 30 tablet 2    buPROPion  (WELLBUTRIN  XL) 150 MG XL tablet Take 1 tablet (150 mg) by mouth every morning. 90 tablet 3    cyclobenzaprine (FLEXERIL) 5 MG tablet TAKE 1 TABLET BY MOUTH EVERY 8 HOURS  FOR 5 DAYS AS NEEDED FOR MUSCLE SPASMS      gabapentin  (NEURONTIN ) 100 MG capsule Take 1-4 capsules by oral route once nightly 2 hours before bedtime. Start with 1 capsule (100mg ) and increased by 1 capsule every 7 days up to max 4 capsules (400mg ) once nightly 360 capsule 1    Multiple Vitamin (MULTIVITAMIN) capsule Take 1 capsule by mouth daily. 30 tablet 0    valACYclovir  (VALTREX ) 500 MG tablet Take 1 tablet (500 mg) by mouth daily. 90 tablet 3       Allergies:  Allergies[2]    Family History:  Family History[3]    Social History:   reports that she has never smoked. She has never used smokeless tobacco. She reports that she does not currently use alcohol. She reports that she does not use drugs.    Review of Systems:  Per HPI    Physical Exam:  Vitals:  BP 123/87 (BP Location: Left arm, BP Patient Position: Sitting, BP cuff size: Regular)   Pulse 76   Temp 98 F (36.7 C) (Oral)   Resp 17   Ht 5' (1.524 m)   Wt 92.1 kg (203 lb 0.7 oz)   LMP 03/28/2024   SpO2 99%   BMI 39.65 kg/m   GEN:No acute distress. Well developed, well nourished. Speaking in full sentences.  HEENT: sclera anicteric, EOMI. TM and  EAC normal. Temporal artery non-tender   CV: RRR, no MRG  Pulm: non-labored and clear.  Extrem: moving all extremities.   Skin: non-cyanotic, no jaundice, no rashes.   Back: FROM. No midline or step off deformities or paraspinal TTP.   Neuro: there is some beaded eye movement otherwise CN2-12 intact, there is normal s/s in UE and LE bilaterally. There is difficulty w/ tandem gait. Negative Romberg. Normal finger to nose.     Assessment & Plan:  Belladonna Lubinski is a 53 year old female here for a couple of concerns.     1. New onset of headaches after age 67 - new onset headaches after 53 years old. There are some neurological findings such as beaded eye movement and difficulty with tandem gait that would warrant an MRI of the brain to rule out intracranial tumor or thrombosis and thus will order. Her BP is  controlled and don't suspect it is from HTN. No visual symptoms and no TTP to temporal artery making temporal arteritis less likely. No e/o ENT infection. Had negative sleep study last year and thus don't suspect OSA. Lower suspicion for migraines given the history. Has tried advil, gabapentin , tylenol  w/o improvement.   - Basic Metabolic Panel, Blood; Future  - MRI Brain W/WO Contrast; Future    2. Acute low back pain with left-sided sciatica, unspecified back pain laterality - improved w/ yoga and conservative measures and thus defer imaging or PT at this time. Check UA to rule out hematuria. Lower suspicion for kidney infection or kidney stone but if there is blood in the urine, will repeat UA and consider U/S and / or CTU. No e/o shingles. No red flag symptoms to warrant MRI of L-spine. No infectious signs/symptoms.    - Urinalysis With Culture Reflex, When Indicated; Future    3. Need for vaccination - due   - Flu Vaccine, Preserv Free, 0.5 mL IM (Admin Now)  - Shingles (SHINGRIX)    4. B12 deficiency - compliant w/ supplementation. Recheck.   - Folic Acid , Blood; Future  - Vitamin B12, Blood; Future    5. Vitamin D  deficiency - not as compliant w/ supplementation. Recheck   - Vitamin D , 25-OH Total; Future    6. Iron deficiency - not compliant w/ supplementation. Recheck   - Iron/IBC Panel; Future      RTC: 8 weeks if not improved     Patient verbalized understanding of the plan without further questions. Return precautions discussed.     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)    Other specified congenital anomaly of skin    Current use of steroid medication    Recurrent HSV (herpes simplex virus)    Papanicolaou smear of cervix with  low grade squamous intraepithelial lesion (LGSIL)    Anxiety and depression    Mixed hyperlipidemia    Low HDL (under 40)    Periodic limb movements of sleep    LGSIL on Pap smear of cervix   [2]   Allergies  Allergen Reactions    Ivig [Other] Other     Lymph nodes swollen    [3]   Family History  Problem Relation Name Age of Onset    Diabetes Mother          HTN, DJD    Neurological Disease (Parkinsons/Huntingtons/Huntingtons Chorea) Father          DM, alcoholism    Diabetes Father      No Known Problems Sister      Thyroid  Brother      Cataract M Grandfather      Cataract P Grandfather

## 2024-04-26 ENCOUNTER — Other Ambulatory Visit: Attending: Anatomic Pathology & Clinical Pathology | Admitting: Dermatology

## 2024-04-26 ENCOUNTER — Encounter: Payer: Self-pay | Admitting: Family Practice

## 2024-04-26 ENCOUNTER — Ambulatory Visit (INDEPENDENT_AMBULATORY_CARE_PROVIDER_SITE_OTHER): Admitting: Family Practice

## 2024-04-26 ENCOUNTER — Other Ambulatory Visit
Admission: RE | Admit: 2024-04-26 | Discharge: 2024-04-26 | Disposition: A | Discharge status: Home / Self Care | Attending: Anatomic Pathology & Clinical Pathology | Admitting: Anatomic Pathology & Clinical Pathology

## 2024-04-26 ENCOUNTER — Ambulatory Visit: Payer: Self-pay | Admitting: Family Practice

## 2024-04-26 VITALS — BP 123/87 | HR 76 | Temp 98.0°F | Resp 17 | Ht 60.0 in | Wt 203.0 lb

## 2024-04-26 DIAGNOSIS — E611 Iron deficiency: Secondary | ICD-10-CM | POA: Insufficient documentation

## 2024-04-26 DIAGNOSIS — E559 Vitamin D deficiency, unspecified: Secondary | ICD-10-CM | POA: Insufficient documentation

## 2024-04-26 DIAGNOSIS — R519 Headache, unspecified: Secondary | ICD-10-CM | POA: Insufficient documentation

## 2024-04-26 DIAGNOSIS — Z23 Encounter for immunization: Secondary | ICD-10-CM

## 2024-04-26 DIAGNOSIS — M5442 Lumbago with sciatica, left side: Secondary | ICD-10-CM

## 2024-04-26 DIAGNOSIS — E538 Deficiency of other specified B group vitamins: Secondary | ICD-10-CM

## 2024-04-26 DIAGNOSIS — L100 Pemphigus vulgaris: Secondary | ICD-10-CM | POA: Insufficient documentation

## 2024-04-26 LAB — URINALYSIS WITH CULTURE REFLEX, WHEN INDICATED - ~~LOC~~
Bilirubin: NEGATIVE
Glucose: NEGATIVE mg/dL
Hemoglobin, Urine: NEGATIVE
Ketones, Urine: NEGATIVE mg/dL
Nitrite: NEGATIVE
Protein: NEGATIVE mg/dL
RBC: <1 /HPF (ref 0–3)
Specific Gravity, Urine: 1.014 (ref 1.003–1.030)
Squamous Epithelial: 1 /LPF (ref 0–10)
UA Culture: NEGATIVE
Urobilinogen: <2 mg/dL (ref <2)
WBC: 1 /HPF (ref <=5)
pH, Urine: 5.5 (ref 5.0–8.0)

## 2024-04-26 LAB — BASIC METABOLIC PANEL, BLOOD - ~~LOC~~
BUN: 15 mg/dL (ref 7–25)
Calcium: 9.5 mg/dL (ref 8.6–10.3)
Carbon Dioxide: 27 mmol/L (ref 21–31)
Chloride: 102 mmol/L (ref 98–107)
Creatinine: 0.9 mg/dL (ref 0.6–1.2)
Electrolyte Balance: 9 mmol/L (ref 2–12)
Glucose: 94 mg/dL (ref 85–125)
Potassium: 4.7 mmol/L (ref 3.5–5.1)
Sodium: 138 mmol/L (ref 136–145)
eGFR: 77 mL/min/1.73 sq mtr (ref >=60)

## 2024-04-26 LAB — IRON/IBC PANEL - ~~LOC~~
% Saturation: 24 % (ref 20–55)
Ferritin: 33.8 ng/mL (ref 10.0–107.0)
Iron: 98 ug/dL (ref 37–170)
TIBC: 409 ug/dL (ref 284–507)
Transferrin: 292 mg/dL (ref 203–362)

## 2024-04-26 LAB — FOLIC ACID, BLOOD - ~~LOC~~: Folate: 16.4 ng/mL (ref >5.9)

## 2024-04-26 LAB — VITAMIN B12, BLOOD - ~~LOC~~: Vitamin B12: 618 pg/mL (ref 180–1241)

## 2024-04-26 LAB — VITAMIN D, 25-OH TOTAL - ~~LOC~~: Vitamin D, 25-Hydroxy, Total: 38.1 ng/mL (ref >30.0)

## 2024-04-26 NOTE — Result Encounter Note (Signed)
 Hi,    Your urine was negative for blood, protein or sugar. There was some leukocyte esterase but no infection given there was less than 10 WBC in the urine.    -Dr. DELENA

## 2024-04-26 NOTE — Patient Instructions (Addendum)
 Blood test  MRI brain  Dr.  Tawni Lemons at [facility]  Flu shot  Shingles shot - final shot.

## 2024-04-26 NOTE — Result Encounter Note (Signed)
 Hi,    Vitamin D  level was normal. It would be a good idea to take 1,000 international units of vitamin D3 daily to ensure we maintain normal vitamin D .    Folic acid  level was normal.     B12 level was now normal. You can now take 1,000mcg of vitamin b12 once a week to keep the level over 400.    Your labs showed you have iron deficiency given the ferritin is below 40. Please take ferrous sulfate 325mg  (which contains 65mg  of elemental iron)--2 pills (take them at the same time), every other day for 3 months. This can be purchased over the counter, without a prescription. Do not take within 30 minutes of any foods or supplements that contain calcium. Best taken on an empty stomach. It can cause constipation and darkening of the stools, thus please maintain good hydration, and if you become constipated take stool softener like docusate (available over the counter).    Electrolytes, liver and kidney function were normal.    Best,  Dr. DELENA

## 2024-04-26 NOTE — Interdisciplinary (Signed)
 Anita Turner is a 53 year old female who is here to follow up on back pain   Pain 4/10

## 2024-04-26 NOTE — Interdisciplinary (Signed)
 Verified Shingrix and flu vaccines with [place] MA

## 2024-04-28 ENCOUNTER — Encounter: Payer: Self-pay | Admitting: Dermatology

## 2024-04-29 NOTE — Telephone Encounter (Signed)
 Spoke with pt.  States the tip of her tongue is red, sore, feels like it is burning and has lost the tasting ability.  No blisters or lesions seen.  Wondering if she has thrush or it is her pemphigus?  Informed her labs are not all in, and the pemphigus labs are still pending, could take a week.  Asked to send us  a picture of her tongue so we can advise her further.  Verbalized understanding of instructions.

## 2024-05-02 NOTE — Telephone Encounter (Signed)
 Spoke with pt, didn't send a picture anymore since her tongue is better now.

## 2024-05-03 LAB — COLLAGEN TYPE VII AUTOABS IN EBA, ELISA - ~~LOC~~

## 2024-05-03 LAB — PEMPHIGOID ANTIBODY PANEL - ~~LOC~~

## 2024-05-03 LAB — PEMPHIGUS ANTIBODY PANEL, IGG - ~~LOC~~

## 2024-05-22 NOTE — Progress Notes (Incomplete)
 Department of Dermatology  [facility]  PATIENT: Anita Turner  MRN: [id number]  DOB: [birth year]  DATE OF SERVICE: 05/23/2024    REFERRING PRACTITIONER: Arlean Radames LABOR  PRIMARY CARE PROVIDER: Julene Kirsch A  CHIEF COMPLAINT:   No chief complaint on file.    Subjective:     Anita Turner is a 53 year old female who was seen in clinic for follow up pemphigus vulgaris.    LOV 04/18/24: IMK 120mg  and ILK administered to the scalp.     Today, patient states ***    Objective:   General: Pleasant, in no acute distress, appears stated age  Respiratory: No increased work of breathing    There were no vitals filed for this visit.           Skin Exam:  Pertinent skin findings below:  - Stuck on pustule on scalp     02/07/2019   Basement Membrane Zone (BMZ) IgG, IgG4, and IgA  IgG:  Negative, monkey esophagus substrate        Negative, human split skin substrate      IgG4:  Negative, monkey esophagus substrate             Negative, human split skin substrate   IgA:  Negative, monkey esophagus substrate        Negative, human split skin substrate   Positive IgG, including IgG4, cell surface antibodies,                monkey esophagus substrate      Enzyme Linked Immunosorbent Assay (ELISA)   --------------------------------------------------   Bullous Pemphigoid (BP) 180 and 230 IgG Antibodies   IgG BP 180 antibodies:  1 unit  NEGATIVE  IgG BP 230 antibodies:  1 unit  NEGATIVE     02/07/2019  Cell Surface IgG Antibodies   IgG:  Positive, titer 1:2560 (H), monkey esophagus substrate        Positive, titer 1:640 (H), intact human skin substrate      Enzyme-Linked Immunosorbent Assay (ELISA)  -----------------------------------------  Desmoglein (DSG) 1 and 3 IgG Antibodies   IgG desmoglein 1 antibodies:   7 units  NEGATIVE    IgG desmoglein 3 antibodies: 320 units (H)  POSITIVE         (Initial level, 139 units, greater than high                calibrator; diluted to achieve 64 units, within          assay calibrators, and  multiplied by the dilution          factor of 5)     11/30/23-    DIAGNOSTIC INTERPRETATION       Negative/normal Pemphigus Antibody Panel, IgG     ____________________________________________________________      RESULTS   Indirect Immunofluorescence (IIF)   -----------------------------------------------------------   Cell Surface (CS)/Intercellular Substance (ICS) IgG Antibodies       IgG:  Negative, monkey esophagus substrate         Negative, intact human skin substrate            Enzyme-Linked Immunosorbent Assay (ELISA)   -----------------------------------------------------------   Desmoglein (DSG) 1 and 3 IgG Antibodies       IgG desmoglein 1 antibody level:  6 U/mL           IgG desmoglein 3 antibody level:  2 U/mL       ANA- NEGATIVE    04/26/24-     -----------------------------------------------------------  Type VII Collagen IgG Antibodies       IgG type VII collagen antibody level:  1 U/mL           Reference Range:                   Normal (negative) = Less than 7 U/mL   Slightly increased (H) (positive) = 7-8 U/mL            Increased (H) (positive) = 9 U/mL and greater          (H) = high/positive       U = semiquantitative antibody level in ELISA units   ____________________________________________________________   Indirect Immunofluorescence (IIF)   -----------------------------------------------------------   Cell Surface (CS)/Intercellular Substance (ICS) IgG Antibodies       IgG:  Negative, monkey esophagus substrate         Negative, intact human skin substrate             Reference Range:           Negative - Titer less than 1:10           Borderline - Titer 1:10           Positive (H) - Titer greater than 1:10            (H) = high/positive       Enzyme-Linked Immunosorbent Assay (ELISA)   -----------------------------------------------------------   Desmoglein (DSG) 1 and 3 IgG Antibodies       IgG desmoglein 1 antibody level:  5 U/mL           Reference Range:              Normal  (negative) = Less than 20 U/mL       Increased (H) (positive) = Greater than or                                  equal to 20 U/mL       IgG desmoglein 3 antibody level:  7 U/mL           Reference Range:              Normal (negative) = Less than 20 U/mL       Increased (H) (positive) = Greater than or                                  equal to 20 U/mL          (H) = high/positive       U = semiquantitative antibody level in ELISA units   _______________________________________________________   Indirect Immunofluorescence (IIF)   -----------------------------------------------------------   Basement Membrane Zone (BMZ) IgG, IgG4, and IgA Antibodies       IgG:  Negative, monkey esophagus substrate         Negative, human split skin substrate           IgG4:  Negative, monkey esophagus substrate              Negative, human split skin substrate       IgA:  Negative, monkey esophagus substrate         Negative, human split skin substrate             Reference Range:  Negative - Titer less than 1:10           Borderline - Titer 1:10           Positive (H) - Titer greater than 1:10            Localization with Human BMZ Split Skin:           IgG and/or IgG4 epidermal (roof) or combined           epidermal-dermal (roof and floor) BMZ antibodies           = pemphigoid (including pemphigoid gestationis,           bullous pemphigoid, some types of mucous           membrane pemphigoid)               IgG and/or IgG4 dermal (floor) BMZ antibodies =           epidermolysis bullosa acquisita or bullous lupus           erythematosus or anti-laminin 332 pemphigoid or           anti-p200 (laminin-beta4, laminin-gamma1)           pemphigoid or another rare pemphigoid subtype               IgA epidermal (roof), combined epidermal-dermal           (roof and floor), or dermal (floor) BMZ antibodies           = linear IgA disease (including linear IgA bullous           dermatosis and chronic bullous disease of            childhood), IgA mucous membrane pemphigoid,           linear IgA variant epidermolysis bullosa acquisita               IgA and IgG basement membrane zone           antibodies may be co-expressed in basement           membrane zone antibody-associated diseases                (H) = high/positive       Enzyme-Linked Immunosorbent Assay (ELISA)   -----------------------------------------------------------   Bullous Pemphigoid (BP)180 and BP230 IgG Antibodies       IgG BP180 antibody level:  1 U/mL           Reference Range:              Normal (negative) = Less than 9 U/mL       Increased (H) (positive) = 9 U/mL and greater       IgG BP230 antibody level:  1 U/mL           Reference Range:              Normal (negative) = Less than 9 U/mL       Increased (H) (positive) = 9 U/mL and greater          (H) = high/positive       U = semiquantitative antibody level in ELISA units   ______________________________________________________       Assessment/Plan     #Rash NOS, query lupus vs rosacea  - Labs ordered LV: ANA     # Pemphigus vulgaris  # High risk medication use  Currently well-controlled with no flares  Not on prednisone   Patient followed by Dr. Thaddeus and prior Dr [facility] including serologies for pemphigus/pemphigoid, CBC, CMP. She was previously on rituxan . 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      - S/p cellcept  1000 mg BID for now (discussed can discontinue pending lab results as below)  - Intralesional injection of kenalog  performed after verbal consent obtained. Risks/benefits/alternatives of the treatment were discussed. Discussed with patient risks of atrophy, telangectasias and hypopigmentation, and that the purpose of ILK is to decrease inflammation. Areas were cleansed with isopropyl alcohol prior to the procedure. Patient tolerated well.  Dose: 10 mg/cc  Total: 10 cc  Site: scalp  - IMK  120 mg today    Monitoring:  - Pemphigus, Pemphigoid, Collagen IIV labs ordered  - Normal IgG type VII collagen antibody level by ELISA   - Vit D and Folic acid  levels were normal. B12 and ferritin levels were low.  - Urine was negative for blood, protein or sugar. There was some leukocyte esterase but no infection given there was less than 10 WBC in the urine              The above plan of care, diagnosis, orders, and follow-up were discussed with the patient.  Questions related to this recommended plan of care were answered.    RTC *** month or sooner prn    Scribe Attestation  The notes I am recording reflect only actions made by and judgments taken by this provider, Dr. Arlean, for whom I am scribing today.  I have performed no independent clinical work.    Tinnie Sharps    Provider Attestation for Scribed Note

## 2024-05-23 ENCOUNTER — Ambulatory Visit: Admitting: Dermatology

## 2024-06-08 ENCOUNTER — Other Ambulatory Visit: Payer: Self-pay | Admitting: Family Practice

## 2024-06-08 DIAGNOSIS — G4761 Periodic limb movement disorder: Secondary | ICD-10-CM
